# Patient Record
Sex: Male | Born: 1945 | Race: White | Hispanic: No | Marital: Married | Smoking: Never smoker
Health system: Southern US, Community
[De-identification: ages and names within clinical notes are randomized; demographics above are authoritative.]

## PROBLEM LIST (undated history)

## (undated) DIAGNOSIS — E785 Hyperlipidemia, unspecified: Secondary | ICD-10-CM

## (undated) DIAGNOSIS — I2510 Atherosclerotic heart disease of native coronary artery without angina pectoris: Secondary | ICD-10-CM

## (undated) DIAGNOSIS — I209 Angina pectoris, unspecified: Secondary | ICD-10-CM

## (undated) DIAGNOSIS — R7303 Prediabetes: Secondary | ICD-10-CM

## (undated) DIAGNOSIS — J40 Bronchitis, not specified as acute or chronic: Secondary | ICD-10-CM

## (undated) DIAGNOSIS — R011 Cardiac murmur, unspecified: Secondary | ICD-10-CM

## (undated) DIAGNOSIS — Z85828 Personal history of other malignant neoplasm of skin: Secondary | ICD-10-CM

## (undated) DIAGNOSIS — C61 Malignant neoplasm of prostate: Secondary | ICD-10-CM

## (undated) DIAGNOSIS — J189 Pneumonia, unspecified organism: Secondary | ICD-10-CM

## (undated) DIAGNOSIS — Z8739 Personal history of other diseases of the musculoskeletal system and connective tissue: Secondary | ICD-10-CM

## (undated) DIAGNOSIS — S62609A Fracture of unspecified phalanx of unspecified finger, initial encounter for closed fracture: Secondary | ICD-10-CM

## (undated) DIAGNOSIS — I10 Essential (primary) hypertension: Secondary | ICD-10-CM

## (undated) DIAGNOSIS — Z87442 Personal history of urinary calculi: Secondary | ICD-10-CM

## (undated) DIAGNOSIS — J329 Chronic sinusitis, unspecified: Secondary | ICD-10-CM

## (undated) DIAGNOSIS — J0100 Acute maxillary sinusitis, unspecified: Secondary | ICD-10-CM

## (undated) DIAGNOSIS — T7840XA Allergy, unspecified, initial encounter: Secondary | ICD-10-CM

## (undated) HISTORY — PX: PROSTATE BIOPSY: SHX241

## (undated) HISTORY — PX: TONSILLECTOMY AND ADENOIDECTOMY: SUR1326

## (undated) HISTORY — DX: Chronic sinusitis, unspecified: J32.9

## (undated) HISTORY — DX: Allergy, unspecified, initial encounter: T78.40XA

## (undated) HISTORY — DX: Acute maxillary sinusitis, unspecified: J01.00

## (undated) HISTORY — PX: WISDOM TOOTH EXTRACTION: SHX21

## (undated) HISTORY — DX: Bronchitis, not specified as acute or chronic: J40

## (undated) HISTORY — PX: CARDIAC SURGERY: SHX584

## (undated) HISTORY — PX: EYE SURGERY: SHX253

## (undated) HISTORY — PX: LITHOTRIPSY: SUR834

---

## 1999-10-12 ENCOUNTER — Ambulatory Visit (HOSPITAL_COMMUNITY): Admission: RE | Admit: 1999-10-12 | Discharge: 1999-10-12 | Payer: Self-pay | Admitting: *Deleted

## 2005-02-22 ENCOUNTER — Ambulatory Visit: Payer: Self-pay | Admitting: Family Medicine

## 2005-03-01 ENCOUNTER — Ambulatory Visit: Payer: Self-pay

## 2007-08-20 ENCOUNTER — Emergency Department (HOSPITAL_COMMUNITY): Admission: EM | Admit: 2007-08-20 | Discharge: 2007-08-20 | Payer: Self-pay | Admitting: Emergency Medicine

## 2007-08-24 ENCOUNTER — Encounter: Payer: Self-pay | Admitting: Urology

## 2007-08-28 ENCOUNTER — Emergency Department (HOSPITAL_COMMUNITY): Admission: EM | Admit: 2007-08-28 | Discharge: 2007-08-28 | Payer: Self-pay | Admitting: Emergency Medicine

## 2007-08-28 ENCOUNTER — Ambulatory Visit (HOSPITAL_COMMUNITY): Admission: RE | Admit: 2007-08-28 | Discharge: 2007-08-28 | Payer: Self-pay | Admitting: Urology

## 2011-04-23 NOTE — Procedures (Signed)
[facility]  Patient:    Lance Stevens                       MRN: [id number] Proc. Date: 10/12/99 Adm. Date:  [id number] Attending:  Sabino Gasser                           Procedure Report  PROCEDURE:  Colonoscopy.  ENDOSCOPIST:  Sabino Gasser, M.D.  INDICATIONS:  Colon polyps seen on flexible sigmoidoscopy.  ANESTHESIA:  Fentanyl 100 mcg and Versed 10 mg intravenously in divided doses.  DESCRIPTION OF PROCEDURE:  With the patient mildly sedated in the left lateral decubitus position, a rectal examination was performed which was unremarkable.  Subsequently, the Olympus videoscopic colonoscope was inserted in the rectum and passed under direct vision to the cecum.  The cecum was identified by the ileocecal valve and appendiceal orifice, both of which were photographed.  From this point, the colonoscope was slowly withdrawn, taking circumferential views of the entire colonic mucosa, stopping then only in the rectum which appeared normal on direct view and on retroflex view as well.  The endoscope was then straightened and pulled back, and it was 10 cm from the anal verge.  A small flat polyp was seen, photographed, and using hot biopsy forceps technique, setting of 25/25 blunted current it was removed.  The endoscope was withdrawn.  The patients vital signs and pulse oximeter remained stable.  The patient tolerated the procedure well and there were no apparent complications.  FINDINGS:  Small polyp at 10 cm from the anal verge, await biopsy report.  The patient will call for me for results, and follow up with me on an as needed basis. DD:  10/12/99 TD:  10/13/99 Job: [id number]

## 2011-09-17 LAB — URINALYSIS, ROUTINE W REFLEX MICROSCOPIC
Leukocytes, UA: NEGATIVE
Nitrite: NEGATIVE
Specific Gravity, Urine: 1.019
Urobilinogen, UA: 0.2
pH: 5.5

## 2011-09-17 LAB — COMPREHENSIVE METABOLIC PANEL
ALT: 37
AST: 17
Albumin: 3.9
Alkaline Phosphatase: 55
BUN: 20
GFR calc Af Amer: 49 — ABNORMAL LOW
Potassium: 5
Sodium: 136
Total Protein: 6.6

## 2011-09-17 LAB — CBC
Platelets: 244
RDW: 12.6

## 2011-09-17 LAB — URINE MICROSCOPIC-ADD ON

## 2011-09-17 LAB — DIFFERENTIAL
Basophils Relative: 1
Monocytes Absolute: 0.5
Monocytes Relative: 5
Neutro Abs: 10.5 — ABNORMAL HIGH

## 2013-09-05 ENCOUNTER — Ambulatory Visit (INDEPENDENT_AMBULATORY_CARE_PROVIDER_SITE_OTHER): Payer: Medicare Other | Admitting: Emergency Medicine

## 2013-09-05 VITALS — BP 126/64 | HR 81 | Temp 97.6°F | Resp 18 | Ht 69.0 in | Wt 234.0 lb

## 2013-09-05 DIAGNOSIS — J209 Acute bronchitis, unspecified: Secondary | ICD-10-CM

## 2013-09-05 DIAGNOSIS — J018 Other acute sinusitis: Secondary | ICD-10-CM

## 2013-09-05 MED ORDER — HYDROCOD POLST-CHLORPHEN POLST 10-8 MG/5ML PO LQCR: 5.0000 mL | Freq: Two times a day (BID) | ORAL | Status: DC | PRN

## 2013-09-05 MED ORDER — PSEUDOEPHEDRINE-GUAIFENESIN ER 60-600 MG PO TB12: 1.0000 | ORAL_TABLET | Freq: Two times a day (BID) | ORAL | Status: AC

## 2013-09-05 MED ORDER — LEVOFLOXACIN 500 MG PO TABS: 500.0000 mg | ORAL_TABLET | Freq: Every day | ORAL | Status: AC

## 2013-09-05 NOTE — Progress Notes (Signed)
[facility] [address] [place] [address] [phone]  Date:  09/05/2013   Name:  Lance Stevens   DOB:  June 19, 1946   MRN:  [id number]  PCP:  No primary provider on file.    Chief Complaint: cough, congestion, yellow drainage   History of Present Illness:  Lance Stevens is a 67 y.o. very pleasant male patient who presents with the following:  Ill all week with nasal congestion and post nasal drainage.  Has purulent nasal discharge.  No fever or chills.  Cough productive of purulent sputum.   No wheezing or shortness of breath.  No nausea or vomiting.  No improvement with over the counter medications or other home remedies. Denies other complaint or health concern today.   There are no active problems to display for this patient.   Past Medical History  Diagnosis Date  . Allergy   . Chronic kidney disease   . Sinus infection   . Bronchitis     Past Surgical History  Procedure Laterality Date  . Eye surgery    . Wisdom tooth extraction      History  Substance Use Topics  . Smoking status: Never Smoker   . Smokeless tobacco: Not on file  . Alcohol Use: Not on file    Family History  Problem Relation Age of Onset  . Heart attack Father   . Parkinson's disease Brother   . Stroke Maternal Grandmother     Allergies  Allergen Reactions  . Penicillins     Medication list has been reviewed and updated.  No current outpatient prescriptions on file prior to visit.   No current facility-administered medications on file prior to visit.    Review of Systems:  As per HPI, otherwise negative.    Physical Examination: Filed Vitals:   09/05/13 1122  BP: 126/64  Pulse: 81  Temp: 97.6 F (36.4 C)  Resp: 18   Filed Vitals:   09/05/13 1122  Height: 5\' 9"  (1.753 m)  Weight: 234 lb (106.142 kg)   Body mass index is 34.54 kg/(m^2). Ideal Body Weight: Weight in (lb) to have BMI = 25: 168.9  GEN: WDWN, NAD, Non-toxic, A & O x  3 HEENT: Atraumatic, Normocephalic. Neck supple. No masses, No LAD. Ears and Nose: No external deformity. CV: RRR, No M/G/R. No JVD. No thrill. No extra heart sounds. PULM: CTA B, no wheezes, crackles, rhonchi. No retractions. No resp. distress. No accessory muscle use. ABD: S, NT, ND, +BS. No rebound. No HSM. EXTR: No c/c/e NEURO Normal gait.  PSYCH: Normally interactive. Conversant. Not depressed or anxious appearing.  Calm demeanor.    Assessment and Plan: Sinusitis Bronchitis tussionex mucinex d augmentin   Signed,  Phillips Odor, MD

## 2013-09-05 NOTE — Patient Instructions (Addendum)

## 2013-12-04 ENCOUNTER — Ambulatory Visit (INDEPENDENT_AMBULATORY_CARE_PROVIDER_SITE_OTHER): Payer: Medicare Other | Admitting: Emergency Medicine

## 2013-12-04 VITALS — BP 158/86 | HR 74 | Temp 98.2°F | Resp 16 | Ht 69.5 in | Wt 235.4 lb

## 2013-12-04 DIAGNOSIS — J018 Other acute sinusitis: Secondary | ICD-10-CM

## 2013-12-04 DIAGNOSIS — J209 Acute bronchitis, unspecified: Secondary | ICD-10-CM

## 2013-12-04 MED ORDER — AMOXICILLIN-POT CLAVULANATE 875-125 MG PO TABS: 1.0000 | ORAL_TABLET | Freq: Two times a day (BID) | ORAL | Status: DC

## 2013-12-04 MED ORDER — PROMETHAZINE-CODEINE 6.25-10 MG/5ML PO SYRP: 5.0000 mL | ORAL_SOLUTION | Freq: Four times a day (QID) | ORAL | Status: DC | PRN

## 2013-12-04 NOTE — Patient Instructions (Signed)

## 2013-12-04 NOTE — Progress Notes (Signed)
[facility] [address], [place] [address] [phone]  Date:  12/04/2013   Name:  Lance Stevens   DOB:  1946/11/30   MRN:  [id number]  PCP:  No primary provider on file.    Chief Complaint: Sinus Congestion and Cough   History of Present Illness:  Lance Stevens is a 67 y.o. very pleasant male patient who presents with the following:  Ill since nasal congestion and purulent drainage.  Has cough productive purulent sputum.  Some exertional shortness of breath.  Has pressure in forehead.  No wheezing. Sore throat.  Has fever no chills.  No nausea or vomiting.  No stool change.  No improvement with over the counter medications or other home remedies. Denies other complaint or health concern today.  There are no active problems to display for this patient.   Past Medical History  Diagnosis Date  . Allergy   . Chronic kidney disease   . Sinus infection   . Bronchitis     Past Surgical History  Procedure Laterality Date  . Eye surgery    . Wisdom tooth extraction      History  Substance Use Topics  . Smoking status: Never Smoker   . Smokeless tobacco: Not on file  . Alcohol Use: Not on file    Family History  Problem Relation Age of Onset  . Heart attack Father   . Parkinson's disease Brother   . Stroke Maternal Grandmother     Allergies  Allergen Reactions  . Penicillins     Medication list has been reviewed and updated.  Current Outpatient Prescriptions on File Prior to Visit  Medication Sig Dispense Refill  . aspirin 81 MG tablet Take 81 mg by mouth daily.      . magnesium 30 MG tablet Take 30 mg by mouth 2 (two) times daily.      . Multiple Vitamin (MULTIVITAMIN) capsule Take 1 capsule by mouth daily.      . chlorpheniramine-HYDROcodone (TUSSIONEX PENNKINETIC ER) 10-8 MG/5ML LQCR Take 5 mLs by mouth every 12 (twelve) hours as needed.  60 mL  0  . pseudoephedrine-guaifenesin (MUCINEX D) 60-600 MG per tablet Take 1 tablet by  mouth every 12 (twelve) hours.  18 tablet  0   No current facility-administered medications on file prior to visit.    Review of Systems:  As per HPI, otherwise negative.    Physical Examination: Filed Vitals:   12/04/13 1002  BP: 158/86  Pulse: 74  Temp: 98.2 F (36.8 C)  Resp: 16   Filed Vitals:   12/04/13 1002  Height: 5' 9.5" (1.765 m)  Weight: 235 lb 6.4 oz (106.777 kg)   Body mass index is 34.28 kg/(m^2). Ideal Body Weight: Weight in (lb) to have BMI = 25: 171.4  GEN: WDWN, NAD, Non-toxic, A & O x 3 HEENT: Atraumatic, Normocephalic. Neck supple. No masses, No LAD. Ears and Nose: No external deformity. CV: RRR, No M/G/R. No JVD. No thrill. No extra heart sounds. PULM: CTA B, no wheezes, crackles, rhonchi. No retractions. No resp. distress. No accessory muscle use. ABD: S, NT, ND, +BS. No rebound. No HSM. EXTR: No c/c/e NEURO Normal gait.  PSYCH: Normally interactive. Conversant. Not depressed or anxious appearing.  Calm demeanor.    Assessment and Plan: Elevated BP from usual  Sinusitis bronchitits augmentin Phen c cod  Signed,  Phillips Odor, MD

## 2015-05-07 ENCOUNTER — Encounter: Payer: Self-pay | Admitting: Family Medicine

## 2015-05-07 ENCOUNTER — Ambulatory Visit (INDEPENDENT_AMBULATORY_CARE_PROVIDER_SITE_OTHER): Payer: Medicare Other | Admitting: Family Medicine

## 2015-05-07 VITALS — BP 140/72 | HR 66 | Temp 97.4°F | Resp 16 | Ht 69.0 in | Wt 218.2 lb

## 2015-05-07 DIAGNOSIS — Z87442 Personal history of urinary calculi: Secondary | ICD-10-CM | POA: Diagnosis not present

## 2015-05-07 DIAGNOSIS — Z1211 Encounter for screening for malignant neoplasm of colon: Secondary | ICD-10-CM | POA: Diagnosis not present

## 2015-05-07 DIAGNOSIS — IMO0001 Reserved for inherently not codable concepts without codable children: Secondary | ICD-10-CM

## 2015-05-07 DIAGNOSIS — R030 Elevated blood-pressure reading, without diagnosis of hypertension: Secondary | ICD-10-CM | POA: Diagnosis not present

## 2015-05-07 DIAGNOSIS — Z820 Family history of epilepsy and other diseases of the nervous system: Secondary | ICD-10-CM | POA: Diagnosis not present

## 2015-05-07 DIAGNOSIS — R7302 Impaired glucose tolerance (oral): Secondary | ICD-10-CM

## 2015-05-07 DIAGNOSIS — Z23 Encounter for immunization: Secondary | ICD-10-CM

## 2015-05-07 LAB — POCT URINALYSIS DIPSTICK
BILIRUBIN UA: NEGATIVE
Blood, UA: NEGATIVE
GLUCOSE UA: NEGATIVE
KETONES UA: NEGATIVE
LEUKOCYTES UA: NEGATIVE
NITRITE UA: NEGATIVE
PROTEIN UA: NEGATIVE
Spec Grav, UA: 1.025
Urobilinogen, UA: 0.2
pH, UA: 5

## 2015-05-07 LAB — COMPREHENSIVE METABOLIC PANEL
ALBUMIN: 4.7 g/dL (ref 3.5–5.2)
ALT: 30 U/L (ref 0–53)
AST: 12 U/L (ref 0–37)
Alkaline Phosphatase: 87 U/L (ref 39–117)
BUN: 17 mg/dL (ref 6–23)
CHLORIDE: 103 meq/L (ref 96–112)
CO2: 25 meq/L (ref 19–32)
CREATININE: 1.24 mg/dL (ref 0.50–1.35)
Calcium: 9.3 mg/dL (ref 8.4–10.5)
GLUCOSE: 136 mg/dL — AB (ref 70–99)
POTASSIUM: 4.9 meq/L (ref 3.5–5.3)
SODIUM: 140 meq/L (ref 135–145)
Total Bilirubin: 1 mg/dL (ref 0.2–1.2)
Total Protein: 6.8 g/dL (ref 6.0–8.3)

## 2015-05-07 LAB — HEMOGLOBIN A1C
HEMOGLOBIN A1C: 7 % — AB (ref <5.7)
MEAN PLASMA GLUCOSE: 154 mg/dL — AB (ref <117)

## 2015-05-07 MED ORDER — ZOSTER VACCINE LIVE 19400 UNT/0.65ML ~~LOC~~ SOLR
0.6500 mL | Freq: Once | SUBCUTANEOUS | Status: DC
Start: 2015-05-07 — End: 2015-12-09

## 2015-05-07 NOTE — Progress Notes (Signed)
Subjective:    Patient ID: Lance Stevens, male    DOB: 1946-01-28, 69 y.o.   MRN: [id number]  05/07/2015  Establish Care   HPI This 69 y.o. male presents to establish care.  Last physical:  Years ago. Colonoscopy:  Lajoyce [facility]ers; 2000; over ten years; needs to schedule.   TDAP:  Years ago. Pneumovax:  Never.   Zostavax:  never Influenza:  Sporadic. Eye exam:  Yearly.  +glasses.  2014. Dental exam:  2015   Shingles vaccine: requesting rx.  Mother with shingles during Hodgkin's disease.  History of chicken pox.  Recurrent sinusitis: [facility] acute sinus issues.  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    Intervened with four different imploding churches.  Blood pressure elevated due to ongoing conflict.  Successful intervention.  Previously placed on Lisinopril; worked really well; Bp 120/80 on Lisinopril.  Developed weakness in legs B; also suffered with sexual function side effects.  Tried eating healthy foods to lower BP.  Did previously weigh 240; avoided sweetened beverages and carbohydrates; blood pressure improved.  Has gained some weight; weighed 214 at home every morning.  Trying to get to 180.  Needs to start walking again.  Daughter is working as Copywriter, advertising.  Not checking regularly at home.  Works in the yard a lot; has Art therapist.  Lots of trees and ivy.  Eats out a lot but enjoys eating at home.  Nephrolithiasis: suffered with kidney stone five years.  Watching diet; s/p lithotripsy.   Brother with Parkinson's disease: no tremor.  Brother with glucose intolerance.  Glucose intolerance: improved with weight loss and dietary modification.  Urinary incontinence:  Driver's Counselling psychologist.  Drives four hours at a time; unable to urinate regularly.  Restricts fluids before driving.  Will need prostate exam.  No troubles urinating.  Gets up once per night to urinate.        Review of Systems  Constitutional: Negative for fever, chills, diaphoresis, activity change, appetite change, fatigue and unexpected weight change.  HENT: Negative for congestion, dental problem, drooling, ear discharge, ear pain, facial swelling, hearing loss, mouth sores, nosebleeds, postnasal drip, rhinorrhea, sinus pressure, sneezing, sore throat, tinnitus, trouble swallowing and voice change.   Eyes: Negative for photophobia, pain, discharge, redness, itching and visual disturbance.  Respiratory: Negative for apnea, cough, choking, chest tightness, shortness of breath, wheezing and stridor.   Cardiovascular: Negative for chest pain, palpitations and leg swelling.  Gastrointestinal: Negative for nausea, vomiting, abdominal pain, diarrhea, constipation and blood in stool.  Endocrine: Negative for cold intolerance, heat intolerance, polydipsia, polyphagia and polyuria.  Genitourinary: Positive for urgency. Negative for dysuria, frequency, hematuria, flank pain, decreased urine volume, discharge, penile swelling, scrotal swelling, enuresis, difficulty urinating, genital sores, penile pain and testicular pain.  Musculoskeletal: Negative for myalgias, back pain, joint swelling, arthralgias, gait problem, neck pain and neck stiffness.  Skin: Negative for color change, pallor, rash and wound.  Allergic/Immunologic: Negative for environmental allergies, food allergies and immunocompromised state.  Neurological: Negative for dizziness, tremors, seizures, syncope, facial asymmetry, speech difficulty, weakness, light-headedness, numbness and headaches.  Hematological: Negative for adenopathy. Does not bruise/bleed easily.  Psychiatric/Behavioral: Negative for suicidal ideas, hallucinations, behavioral problems, confusion, sleep disturbance, self-injury, dysphoric mood, decreased concentration and agitation. The patient is not nervous/anxious and is not hyperactive.     Past Medical History   Diagnosis Date  . Allergy   . Sinus infection   .  Bronchitis   . Nephrolithiasis    Past Surgical History  Procedure Laterality Date  . Wisdom tooth extraction    . Lithotripsy    . Eye surgery      B cataracts removed.   Allergies  Allergen Reactions  . Penicillins Swelling    childhood    History   Social History  . Marital Status: Married    Spouse Name: N/A  . Number of Children: N/A  . Years of Education: N/A   Occupational History  . retired    Social History Main Topics  . Smoking status: Never Smoker   . Smokeless tobacco: Not on file  . Alcohol Use: 1.2 oz/week    2 Standard drinks or equivalent per week     Comment: wine - red  . Drug Use: Not on file  . Sexual Activity: Not on file   Other Topics Concern  . Not on file   Social History Narrative   Marital status: married x 25 years; second marriage; first marriage x 12 years.      Children: 1 daughter Apolonio Schneiders); 4 grandchildren      Lives: with wife.      Employment: semi-retired; Company secretary; professor; Social worker, Probation officer.      Tobacco:        Alcohol:      Exercise: active in the yard.   Family History  Problem Relation Age of Onset  . Heart attack Father   . Heart disease Father 43    AMI as cause of death  . Diabetes Father   . Parkinson's disease Brother   . Diabetes Brother   . Cancer Brother     melanoma retina  . Stroke Maternal Grandmother   . Cancer Mother 67    Hodgkins   . Glaucoma Mother         Objective:    BP 140/72 mmHg  Pulse 66  Temp(Src) 97.4 F (36.3 C) (Oral)  Resp 16  Ht 5\' 9"  (1.753 m)  Wt 218 lb 3.2 oz (98.975 kg)  BMI 32.21 kg/m2  SpO2 97% Physical Exam  Constitutional: He is oriented to person, place, and time. He appears well-developed and well-nourished. No distress.  HENT:  Head: Normocephalic and atraumatic.  Right Ear: External ear normal.  Left Ear: External ear normal.  Nose: Nose normal.  Mouth/Throat: Oropharynx is clear and moist.   Eyes: Conjunctivae and EOM are normal. Pupils are equal, round, and reactive to light.  Neck: Normal range of motion. Neck supple. Carotid bruit is not present. No thyromegaly present.  Cardiovascular: Normal rate, regular rhythm, normal heart sounds and intact distal pulses.  Exam reveals no gallop and no friction rub.   No murmur heard. Pulmonary/Chest: Effort normal and breath sounds normal. He has no wheezes. He has no rales.  Abdominal: Soft. Bowel sounds are normal. He exhibits no distension and no mass. There is no tenderness. There is no rebound and no guarding.  Musculoskeletal:       Right shoulder: Normal.       Left shoulder: Normal.       Cervical back: Normal.  Lymphadenopathy:    He has no cervical adenopathy.  Neurological: He is alert and oriented to person, place, and time. He has normal reflexes. No cranial nerve deficit. He exhibits normal muscle tone. Coordination normal.  Skin: Skin is warm and dry. No rash noted. He is not diaphoretic.  Psychiatric: He has a normal mood and affect. His behavior is normal.  Judgment and thought content normal.    PREVNAR 13 ADMINISTERED.    Assessment & Plan:   1. Glucose intolerance (impaired glucose tolerance)   2. Blood pressure elevated   3. Colon cancer screening   4. Need for shingles vaccine   5. Family history of Parkinson's disease   6. History of nephrolithiasis   7. Need for prophylactic vaccination against Streptococcus pneumoniae (pneumococcus)    1. Glucose intolerance: Stable; obtain labs; continue to work on exercise, weight loss, dietary modification. 2. Blood pressure elevated: Chronic; obtain labs; recommend weight loss, exercise, low-sodium food choices.  RTC 3-6 months for recheck. 3.  Colon cancer screening: refer to GI for consultation. 4.  Rx for Zostavax provided. 5.  Family history of Parkinson's disease: In brother; pt asymptomatic. 6.  S/p Prevnar 13 in office.   Meds ordered this encounter   Medications  . zoster vaccine live, PF, (ZOSTAVAX) 68127 UNT/0.65ML injection    Sig: Inject 19,400 Units into the skin once.    Dispense:  0.65 mL    Refill:  0    Return in about 3 months (around 08/07/2015) for complete physical examiniation.     Aldous Housel Elayne Guerin, M.D. [facility] [address] [place], [place]  [address] [phone] phone [phone] fax

## 2015-05-07 NOTE — Patient Instructions (Signed)
DASH Eating Plan °DASH stands for "Dietary Approaches to Stop Hypertension." The DASH eating plan is a healthy eating plan that has been shown to reduce high blood pressure (hypertension). Additional health benefits may include reducing the risk of type 2 diabetes mellitus, heart disease, and stroke. The DASH eating plan may also help with weight loss. °WHAT DO I NEED TO KNOW ABOUT THE DASH EATING PLAN? °For the DASH eating plan, you will follow these general guidelines: °· Choose foods with a percent daily value for sodium of less than 5% (as listed on the food label). °· Use salt-free seasonings or herbs instead of table salt or sea salt. °· Check with your health care provider or pharmacist before using salt substitutes. °· Eat lower-sodium products, often labeled as "lower sodium" or "no salt added." °· Eat fresh foods. °· Eat more vegetables, fruits, and low-fat dairy products. °· Choose whole grains. Look for the word "whole" as the first word in the ingredient list. °· Choose fish and skinless chicken or turkey more often than red meat. Limit fish, poultry, and meat to 6 oz (170 g) each day. °· Limit sweets, desserts, sugars, and sugary drinks. °· Choose heart-healthy fats. °· Limit cheese to 1 oz (28 g) per day. °· Eat more home-cooked food and less restaurant, buffet, and fast food. °· Limit fried foods. °· Cook foods using methods other than frying. °· Limit canned vegetables. If you do use them, rinse them well to decrease the sodium. °· When eating at a restaurant, ask that your food be prepared with less salt, or no salt if possible. °WHAT FOODS CAN I EAT? °Seek help from a dietitian for individual calorie needs. °Grains °Whole grain or whole wheat bread. Brown rice. Whole grain or whole wheat pasta. Quinoa, bulgur, and whole grain cereals. Low-sodium cereals. Corn or whole wheat flour tortillas. Whole grain cornbread. Whole grain crackers. Low-sodium crackers. °Vegetables °Fresh or frozen vegetables  (raw, steamed, roasted, or grilled). Low-sodium or reduced-sodium tomato and vegetable juices. Low-sodium or reduced-sodium tomato sauce and paste. Low-sodium or reduced-sodium canned vegetables.  °Fruits °All fresh, canned (in natural juice), or frozen fruits. °Meat and Other Protein Products °Ground beef (85% or leaner), grass-fed beef, or beef trimmed of fat. Skinless chicken or turkey. Ground chicken or turkey. Pork trimmed of fat. All fish and seafood. Eggs. Dried beans, peas, or lentils. Unsalted nuts and seeds. Unsalted canned beans. °Dairy °Low-fat dairy products, such as skim or 1% milk, 2% or reduced-fat cheeses, low-fat ricotta or cottage cheese, or plain low-fat yogurt. Low-sodium or reduced-sodium cheeses. °Fats and Oils °Tub margarines without trans fats. Light or reduced-fat mayonnaise and salad dressings (reduced sodium). Avocado. Safflower, olive, or canola oils. Natural peanut or almond butter. °Other °Unsalted popcorn and pretzels. °The items listed above may not be a complete list of recommended foods or beverages. Contact your dietitian for more options. °WHAT FOODS ARE NOT RECOMMENDED? °Grains °White bread. White pasta. White rice. Refined cornbread. Bagels and croissants. Crackers that contain trans fat. °Vegetables °Creamed or fried vegetables. Vegetables in a cheese sauce. Regular canned vegetables. Regular canned tomato sauce and paste. Regular tomato and vegetable juices. °Fruits °Dried fruits. Canned fruit in light or heavy syrup. Fruit juice. °Meat and Other Protein Products °Fatty cuts of meat. Ribs, chicken wings, bacon, sausage, bologna, salami, chitterlings, fatback, hot dogs, bratwurst, and packaged luncheon meats. Salted nuts and seeds. Canned beans with salt. °Dairy °Whole or 2% milk, cream, half-and-half, and cream cheese. Whole-fat or sweetened yogurt. Full-fat   cheeses or blue cheese. Nondairy creamers and whipped toppings. Processed cheese, cheese spreads, or cheese  curds. °Condiments °Onion and garlic salt, seasoned salt, table salt, and sea salt. Canned and packaged gravies. Worcestershire sauce. Tartar sauce. Barbecue sauce. Teriyaki sauce. Soy sauce, including reduced sodium. Steak sauce. Fish sauce. Oyster sauce. Cocktail sauce. Horseradish. Ketchup and mustard. Meat flavorings and tenderizers. Bouillon cubes. Hot sauce. Tabasco sauce. Marinades. Taco seasonings. Relishes. °Fats and Oils °Butter, stick margarine, lard, shortening, ghee, and bacon fat. Coconut, palm kernel, or palm oils. Regular salad dressings. °Other °Pickles and olives. Salted popcorn and pretzels. °The items listed above may not be a complete list of foods and beverages to avoid. Contact your dietitian for more information. °WHERE CAN I FIND MORE INFORMATION? °National Heart, Lung, and Blood Institute: www.nhlbi.nih.gov/health/health-topics/topics/dash/ °Document Released: 11/11/2011 Document Revised: 04/08/2014 Document Reviewed: 09/26/2013 °ExitCare® Patient Information ©2015 [employer]. This information is not intended to replace advice given to you by your health care provider. Make sure you discuss any questions you have with your health care provider. ° °

## 2015-05-13 ENCOUNTER — Encounter: Payer: Self-pay | Admitting: Family Medicine

## 2015-05-27 DIAGNOSIS — Z1211 Encounter for screening for malignant neoplasm of colon: Secondary | ICD-10-CM | POA: Insufficient documentation

## 2015-05-27 DIAGNOSIS — Z820 Family history of epilepsy and other diseases of the nervous system: Secondary | ICD-10-CM | POA: Insufficient documentation

## 2015-05-27 DIAGNOSIS — Z23 Encounter for immunization: Secondary | ICD-10-CM | POA: Insufficient documentation

## 2015-05-27 DIAGNOSIS — Z87442 Personal history of urinary calculi: Secondary | ICD-10-CM | POA: Insufficient documentation

## 2015-05-27 DIAGNOSIS — E119 Type 2 diabetes mellitus without complications: Secondary | ICD-10-CM | POA: Insufficient documentation

## 2015-06-25 ENCOUNTER — Encounter: Payer: Self-pay | Admitting: Gastroenterology

## 2015-08-13 ENCOUNTER — Ambulatory Visit (AMBULATORY_SURGERY_CENTER): Payer: Self-pay

## 2015-08-13 VITALS — Ht 70.0 in | Wt 218.0 lb

## 2015-08-13 DIAGNOSIS — Z860100 Personal history of colon polyps, unspecified: Secondary | ICD-10-CM

## 2015-08-13 NOTE — Progress Notes (Signed)
No home oxygen No allergies to eggs or soy No past problems with anesthesia No diet/weight loss meds  Has email  Emmi instructions given for colonoscopy 

## 2015-08-27 ENCOUNTER — Ambulatory Visit (AMBULATORY_SURGERY_CENTER): Payer: Medicare Other | Admitting: Gastroenterology

## 2015-08-27 ENCOUNTER — Encounter: Payer: Self-pay | Admitting: Gastroenterology

## 2015-08-27 VITALS — BP 136/80 | HR 59 | Temp 97.8°F | Resp 20 | Ht 69.0 in | Wt 218.0 lb

## 2015-08-27 DIAGNOSIS — Z86010 Personal history of colonic polyps: Secondary | ICD-10-CM | POA: Diagnosis not present

## 2015-08-27 MED ORDER — SODIUM CHLORIDE 0.9 % IV SOLN: 500.0000 mL | INTRAVENOUS | Status: DC

## 2015-08-27 NOTE — Op Note (Signed)
[place]  [address] [place], [id number]   COLONOSCOPY PROCEDURE REPORT  PATIENT: Lance Stevens, Lance Stevens  MR#: [id number] BIRTHDATE: 1946/08/02 , 69  yrs. old GENDER: male ENDOSCOPIST: Ladene Artist, MD, [facility] REFERRED HW:[id number] Tamala Julian, M.D. PROCEDURE DATE:  08/27/2015 PROCEDURE:   Colonoscopy, surveillance First Screening Colonoscopy - Avg.  risk and is 50 yrs.  old or older - No.  Prior Negative Screening - Now for repeat screening. N/A  History of Adenoma - Now for follow-up colonoscopy & has been > or = to 3 yrs.  Yes hx of adenoma.  Has been 3 or more years since last colonoscopy.  Polyps removed today? No Recommend repeat exam, <10 yrs? No ASA CLASS:   Class II INDICATIONS:Surveillance due to prior colonic neoplasia and PH Colon Adenoma. MEDICATIONS: Monitored anesthesia care and Propofol 170 mg IV DESCRIPTION OF PROCEDURE:   After the risks benefits and alternatives of the procedure were thoroughly explained, informed consent was obtained.  The digital rectal exam revealed no abnormalities of the rectum.   The LB [id number]  endoscope was introduced through the anus and advanced to the cecum, which was identified by both the appendix and ileocecal valve. No adverse events experienced.   The quality of the prep was excellent. (Suprep was used)  The instrument was then slowly withdrawn as the colon was fully examined. Estimated blood loss is zero unless otherwise noted in this procedure report.    COLON FINDINGS: A normal appearing cecum, ileocecal valve, and appendiceal orifice were identified.  The ascending, transverse, descending, sigmoid colon, and rectum appeared unremarkable. Retroflexed views revealed internal Grade I hemorrhoids. The time to cecum = 2.0 Withdrawal time = 11.2   The scope was withdrawn and the procedure completed. COMPLICATIONS: There were no immediate complications.  ENDOSCOPIC IMPRESSION: 1.  Normal colonoscopy 2.   Grade I internal hemorrhoids  RECOMMENDATIONS: Continue current colorectal screening recommendations for "routine risk" patients with a repeat colonoscopy in 10 years.  eSigned:  Ladene Artist, MD, [facility] 08/27/2015 8:48 AM

## 2015-08-27 NOTE — Patient Instructions (Signed)
Impressions/recommendations:  Normal colonoscopy Internal hemorrhoids (handout given)  Repeat colonoscopy in 10 years.  YOU HAD AN ENDOSCOPIC PROCEDURE TODAY AT [facility]:   Refer to the procedure report that was given to you for any specific questions about what was found during the examination.  If the procedure report does not answer your questions, please call your gastroenterologist to clarify.  If you requested that your care partner not be given the details of your procedure findings, then the procedure report has been included in a sealed envelope for you to review at your convenience later.  YOU SHOULD EXPECT: Some feelings of bloating in the abdomen. Passage of more gas than usual.  Walking can help get rid of the air that was put into your GI tract during the procedure and reduce the bloating. If you had a lower endoscopy (such as a colonoscopy or flexible sigmoidoscopy) you may notice spotting of blood in your stool or on the toilet paper. If you underwent a bowel prep for your procedure, you may not have a normal bowel movement for a few days.  Please Note:  You might notice some irritation and congestion in your nose or some drainage.  This is from the oxygen used during your procedure.  There is no need for concern and it should clear up in a day or so.  SYMPTOMS TO REPORT IMMEDIATELY:   Following lower endoscopy (colonoscopy or flexible sigmoidoscopy):  Excessive amounts of blood in the stool  Significant tenderness or worsening of abdominal pains  Swelling of the abdomen that is new, acute  Fever of 100F or higher  For urgent or emergent issues, a gastroenterologist can be reached at any hour by calling [phone].   DIET: Your first meal following the procedure should be a small meal and then it is ok to progress to your normal diet. Heavy or fried foods are harder to digest and may make you feel nauseous or bloated.  Likewise, meals heavy in dairy  and vegetables can increase bloating.  Drink plenty of fluids but you should avoid alcoholic beverages for 24 hours.  ACTIVITY:  You should plan to take it easy for the rest of today and you should NOT DRIVE or use heavy machinery until tomorrow (because of the sedation medicines used during the test).    FOLLOW UP: Our staff will call the number listed on your records the next business day following your procedure to check on you and address any questions or concerns that you may have regarding the information given to you following your procedure. If we do not reach you, we will leave a message.  However, if you are feeling well and you are not experiencing any problems, there is no need to return our call.  We will assume that you have returned to your regular daily activities without incident.  If any biopsies were taken you will be contacted by phone or by letter within the next 1-3 weeks.  Please call us at [phone] if you have not heard about the biopsies in 3 weeks.    SIGNATURES/CONFIDENTIALITY: You and/or your care partner have signed paperwork which will be entered into your electronic medical record.  These signatures attest to the fact that that the information above on your After Visit Summary has been reviewed and is understood.  Full responsibility of the confidentiality of this discharge information lies with you and/or your care-partner.

## 2015-08-27 NOTE — Progress Notes (Signed)
Report to PACU, RN, vss, BBS= Clear.  

## 2015-08-28 ENCOUNTER — Telehealth: Payer: Self-pay | Admitting: *Deleted

## 2015-08-28 NOTE — Telephone Encounter (Signed)
  Follow up Call-  Call back number 08/27/2015  Post procedure Call Back phone  # [phone]  Permission to leave phone message Yes    [facility]

## 2015-11-27 ENCOUNTER — Ambulatory Visit (INDEPENDENT_AMBULATORY_CARE_PROVIDER_SITE_OTHER): Payer: Medicare Other | Admitting: Physician Assistant

## 2015-11-27 VITALS — BP 120/70 | HR 73 | Temp 97.9°F | Resp 18 | Ht 69.75 in | Wt 213.6 lb

## 2015-11-27 DIAGNOSIS — J069 Acute upper respiratory infection, unspecified: Secondary | ICD-10-CM | POA: Diagnosis not present

## 2015-11-27 MED ORDER — IPRATROPIUM BROMIDE 0.03 % NA SOLN: 2.0000 | Freq: Two times a day (BID) | NASAL | Status: DC

## 2015-11-27 MED ORDER — GUAIFENESIN ER 1200 MG PO TB12: 1.0000 | ORAL_TABLET | Freq: Two times a day (BID) | ORAL | Status: DC | PRN

## 2015-11-27 MED ORDER — HYDROCOD POLST-CPM POLST ER 10-8 MG/5ML PO SUER: 5.0000 mL | Freq: Every evening | ORAL | Status: DC | PRN

## 2015-11-27 MED ORDER — AZITHROMYCIN 250 MG PO TABS: ORAL_TABLET | ORAL | Status: DC

## 2015-11-27 NOTE — Patient Instructions (Signed)
uUpper Respiratory Infection, Adult Most upper respiratory infections (URIs) are a viral infection of the air passages leading to the lungs. A URI affects the nose, throat, and upper air passages. The most common type of URI is nasopharyngitis and is typically referred to as "the common cold." URIs run their course and usually go away on their own. Most of the time, a URI does not require medical attention, but sometimes a bacterial infection in the upper airways can follow a viral infection. This is called a secondary infection. Sinus and middle ear infections are common types of secondary upper respiratory infections. Bacterial pneumonia can also complicate a URI. A URI can worsen asthma and chronic obstructive pulmonary disease (COPD). Sometimes, these complications can require emergency medical care and may be life threatening.  CAUSES Almost all URIs are caused by viruses. A virus is a type of germ and can spread from one person to another.  RISKS FACTORS You may be at risk for a URI if:   You smoke.   You have chronic heart or lung disease.  You have a weakened defense (immune) system.   You are very young or very old.   You have nasal allergies or asthma.  You work in crowded or poorly ventilated areas.  You work in health care facilities or schools. SIGNS AND SYMPTOMS  Symptoms typically develop 2-3 days after you come in contact with a cold virus. Most viral URIs last 7-10 days. However, viral URIs from the influenza virus (flu virus) can last 14-18 days and are typically more severe. Symptoms may include:   Runny or stuffy (congested) nose.   Sneezing.   Cough.   Sore throat.   Headache.   Fatigue.   Fever.   Loss of appetite.   Pain in your forehead, behind your eyes, and over your cheekbones (sinus pain).  Muscle aches.  DIAGNOSIS  Your health care provider may diagnose a URI by:  Physical exam.  Tests to check that your symptoms are not due to  another condition such as:  Strep throat.  Sinusitis.  Pneumonia.  Asthma. TREATMENT  A URI goes away on its own with time. It cannot be cured with medicines, but medicines may be prescribed or recommended to relieve symptoms. Medicines may help:  Reduce your fever.  Reduce your cough.  Relieve nasal congestion. HOME CARE INSTRUCTIONS   Take medicines only as directed by your health care provider.   Gargle warm saltwater or take cough drops to comfort your throat as directed by your health care provider.  Use a warm mist humidifier or inhale steam from a shower to increase air moisture. This may make it easier to breathe.  Drink enough fluid to keep your urine clear or pale yellow.   Eat soups and other clear broths and maintain good nutrition.   Rest as needed.   Return to work when your temperature has returned to normal or as your health care provider advises. You may need to stay home longer to avoid infecting others. You can also use a face mask and careful hand washing to prevent spread of the virus.  Increase the usage of your inhaler if you have asthma.   Do not use any tobacco products, including cigarettes, chewing tobacco, or electronic cigarettes. If you need help quitting, ask your health care provider. PREVENTION  The best way to protect yourself from getting a cold is to practice good hygiene.   Avoid oral or hand contact with people with cold  symptoms.   Wash your hands often if contact occurs.  There is no clear evidence that vitamin C, vitamin E, echinacea, or exercise reduces the chance of developing a cold. However, it is always recommended to get plenty of rest, exercise, and practice good nutrition.  SEEK MEDICAL CARE IF:   You are getting worse rather than better.   Your symptoms are not controlled by medicine.   You have chills.  You have worsening shortness of breath.  You have brown or red mucus.  You have yellow or brown nasal  discharge.  You have pain in your face, especially when you bend forward.  You have a fever.  You have swollen neck glands.  You have pain while swallowing.  You have white areas in the back of your throat. SEEK IMMEDIATE MEDICAL CARE IF:   You have severe or persistent:  Headache.  Ear pain.  Sinus pain.  Chest pain.  You have chronic lung disease and any of the following:  Wheezing.  Prolonged cough.  Coughing up blood.  A change in your usual mucus.  You have a stiff neck.  You have changes in your:  Vision.  Hearing.  Thinking.  Mood. MAKE SURE YOU:   Understand these instructions.  Will watch your condition.  Will get help right away if you are not doing well or get worse.   This information is not intended to replace advice given to you by your health care provider. Make sure you discuss any questions you have with your health care provider.   Document Released: 05/18/2001 Document Revised: 04/08/2015 Document Reviewed: 02/27/2014 Elsevier Interactive Patient Education 2016 [employer] Inc.  

## 2015-11-27 NOTE — Progress Notes (Signed)
[facility] [address] [place] [address] [phone]  Date:  11/27/2015   Name:  Lance Stevens   DOB:  1946/08/05   MRN:  [id number]  PCP:  No primary care provider on file.    History of Present Illness:  Lance Stevens is a 69 y.o. male patient who presents to [facility] for cc of 5 days of cough.  symptomps began with sore throat, followed by cough that became a yellow productive sputum.  Nasal congestion without facial pain.  There is no fever.  Cough worsens at night.  No sob or dyspnea.     Patient Active Problem List   Diagnosis Date Noted  . Glucose intolerance (impaired glucose tolerance) 05/27/2015  . Blood pressure elevated 05/27/2015  . Colon cancer screening 05/27/2015  . Need for shingles vaccine 05/27/2015  . Family history of Parkinson's disease 05/27/2015  . History of nephrolithiasis 05/27/2015    Past Medical History  Diagnosis Date  . Allergy   . Sinus infection   . Bronchitis   . Nephrolithiasis     Past Surgical History  Procedure Laterality Date  . Wisdom tooth extraction    . Lithotripsy    . Eye surgery      B cataracts removed.    Social History  Substance Use Topics  . Smoking status: Never Smoker   . Smokeless tobacco: Never Used  . Alcohol Use: 1.2 oz/week    2 Standard drinks or equivalent per week     Comment: wine - red    Family History  Problem Relation Age of Onset  . Heart attack Father   . Heart disease Father 67    AMI as cause of death  . Diabetes Father   . Parkinson's disease Brother   . Diabetes Brother   . Cancer Brother     melanoma retina  . Stroke Maternal Grandmother   . Cancer Mother 66    Hodgkins   . Glaucoma Mother   . Colon cancer Neg Hx   . Colon polyps Neg Hx     Allergies  Allergen Reactions  . Penicillins Swelling    childhood    Medication list has been reviewed and updated.  Current Outpatient Prescriptions on File Prior to Visit  Medication Sig Dispense  Refill  . aspirin 325 MG EC tablet Take 325 mg by mouth daily.    . magnesium 30 MG tablet Take 30 mg by mouth 2 (two) times daily.    . Multiple Vitamin (MULTIVITAMIN) capsule Take 1 capsule by mouth daily.    Marland Kitchen zoster vaccine live, PF, (ZOSTAVAX) 09811 UNT/0.65ML injection Inject 19,400 Units into the skin once. (Patient not taking: Reported on 08/27/2015) 0.65 mL 0   No current facility-administered medications on file prior to visit.    ROS ROS otherwise unremarkable unless listed above.   Physical Examination: BP 120/70 mmHg  Pulse 73  Temp(Src) 97.9 F (36.6 C) (Oral)  Resp 18  Ht 5' 9.75" (1.772 m)  Wt 213 lb 9.6 oz (96.888 kg)  BMI 30.86 kg/m2  SpO2 98% Ideal Body Weight: Weight in (lb) to have BMI = 25: 172.6  Physical Exam  Constitutional: He is oriented to person, place, and time. He appears well-developed and well-nourished. No distress.  HENT:  Head: Atraumatic.  Right Ear: Tympanic membrane, external ear and ear canal normal.  Left Ear: Tympanic membrane, external ear and ear canal normal.  Nose: Mucosal edema and rhinorrhea present.  Right sinus exhibits no maxillary sinus tenderness and no frontal sinus tenderness. Left sinus exhibits no maxillary sinus tenderness and no frontal sinus tenderness.  Mouth/Throat: No uvula swelling. No oropharyngeal exudate, posterior oropharyngeal edema or posterior oropharyngeal erythema.  Eyes: Conjunctivae, EOM and lids are normal. Pupils are equal, round, and reactive to light. Right eye exhibits normal extraocular motion. Left eye exhibits normal extraocular motion.  Neck: Trachea normal and full passive range of motion without pain. No edema and no erythema present.  Cardiovascular: Normal rate and regular rhythm.  Exam reveals no gallop and no friction rub.   No murmur heard. Pulmonary/Chest: Effort normal. No respiratory distress. He has no decreased breath sounds. He has no wheezes. He has no rhonchi.  Neurological: He is  alert and oriented to person, place, and time.  Skin: Skin is warm and dry. He is not diaphoretic.  Psychiatric: He has a normal mood and affect. His behavior is normal.     Assessment and Plan: Lance Stevens is a 69 y.o. male who is here today for cc of nasal congestion and cough.   -treating supportively at this time.  -zpak given for fill after 3-4 days if symptoms continue.  Alarming sxs discussed to warrant immediate return.     Acute upper respiratory infection - Plan: azithromycin (ZITHROMAX) 250 MG tablet, Guaifenesin (MUCINEX MAXIMUM STRENGTH) 1200 MG TB12, ipratropium (ATROVENT) 0.03 % nasal spray, chlorpheniramine-HYDROcodone (TUSSIONEX PENNKINETIC ER) 10-8 MG/5ML SUER   Ivar Drape, PA-C [facility] 11/27/2015 2:34 PM

## 2015-12-09 ENCOUNTER — Encounter: Payer: Self-pay | Admitting: Physician Assistant

## 2015-12-09 ENCOUNTER — Ambulatory Visit (INDEPENDENT_AMBULATORY_CARE_PROVIDER_SITE_OTHER): Payer: Medicare Other | Admitting: Physician Assistant

## 2015-12-09 VITALS — BP 130/76 | HR 73 | Temp 97.4°F | Resp 16 | Ht 69.0 in | Wt 210.0 lb

## 2015-12-09 DIAGNOSIS — E119 Type 2 diabetes mellitus without complications: Secondary | ICD-10-CM

## 2015-12-09 DIAGNOSIS — Z7689 Persons encountering health services in other specified circumstances: Secondary | ICD-10-CM

## 2015-12-09 DIAGNOSIS — R7302 Impaired glucose tolerance (oral): Secondary | ICD-10-CM

## 2015-12-09 DIAGNOSIS — Z1159 Encounter for screening for other viral diseases: Secondary | ICD-10-CM

## 2015-12-09 DIAGNOSIS — Z7189 Other specified counseling: Secondary | ICD-10-CM

## 2015-12-09 DIAGNOSIS — R3915 Urgency of urination: Secondary | ICD-10-CM

## 2015-12-09 DIAGNOSIS — Z23 Encounter for immunization: Secondary | ICD-10-CM

## 2015-12-09 DIAGNOSIS — N529 Male erectile dysfunction, unspecified: Secondary | ICD-10-CM

## 2015-12-09 DIAGNOSIS — N3941 Urge incontinence: Secondary | ICD-10-CM

## 2015-12-09 LAB — COMPREHENSIVE METABOLIC PANEL
ALK PHOS: 91 U/L (ref 40–115)
ALT: 32 U/L (ref 9–46)
AST: 12 U/L (ref 10–35)
Albumin: 4.5 g/dL (ref 3.6–5.1)
BUN: 18 mg/dL (ref 7–25)
CALCIUM: 9.4 mg/dL (ref 8.6–10.3)
CHLORIDE: 101 mmol/L (ref 98–110)
CO2: 28 mmol/L (ref 20–31)
Creat: 1.1 mg/dL (ref 0.70–1.25)
GLUCOSE: 118 mg/dL — AB (ref 65–99)
POTASSIUM: 4.3 mmol/L (ref 3.5–5.3)
Sodium: 140 mmol/L (ref 135–146)
Total Bilirubin: 1.1 mg/dL (ref 0.2–1.2)
Total Protein: 6.8 g/dL (ref 6.1–8.1)

## 2015-12-09 LAB — POC MICROSCOPIC URINALYSIS (UMFC): MUCUS RE: ABSENT

## 2015-12-09 LAB — LIPID PANEL
CHOL/HDL RATIO: 8 ratio — AB (ref <=5.0)
CHOLESTEROL: 200 mg/dL (ref 125–200)
HDL: 25 mg/dL — ABNORMAL LOW (ref >=40)
LDL Cholesterol: 103 mg/dL (ref <130)
TRIGLYCERIDES: 360 mg/dL — AB (ref <150)
VLDL: 72 mg/dL — AB (ref <30)

## 2015-12-09 LAB — POCT URINALYSIS DIP (MANUAL ENTRY)
BILIRUBIN UA: NEGATIVE
Bilirubin, UA: NEGATIVE
Glucose, UA: NEGATIVE
Leukocytes, UA: NEGATIVE
Nitrite, UA: NEGATIVE
PROTEIN UA: NEGATIVE
RBC UA: NEGATIVE
SPEC GRAV UA: 1.02
Urobilinogen, UA: 0.2
pH, UA: 5

## 2015-12-09 LAB — POCT GLYCOSYLATED HEMOGLOBIN (HGB A1C): HEMOGLOBIN A1C: 6.8

## 2015-12-09 LAB — GLUCOSE, POCT (MANUAL RESULT ENTRY): POC GLUCOSE: 127 mg/dL — AB (ref 70–99)

## 2015-12-09 MED ORDER — ZOSTER VACCINE LIVE 19400 UNT/0.65ML ~~LOC~~ SOLR: 0.6500 mL | Freq: Once | SUBCUTANEOUS | Status: DC

## 2015-12-09 MED ORDER — SILDENAFIL CITRATE 100 MG PO TABS: 100.0000 mg | ORAL_TABLET | Freq: Every day | ORAL | Status: DC | PRN

## 2015-12-09 NOTE — Patient Instructions (Addendum)
I will contact you with your lab results as soon as they are available.   If you have not heard from me in 2 weeks, please contact me.  The fastest way to get your results is to register for My Chart (see the instructions on the last page of this printout).  Keep up the GREAT work!  Keeping you healthy  Get these tests  Blood pressure- Have your blood pressure checked once a year by your healthcare provider.  Normal blood pressure is 120/80  Weight- Have your body mass index (BMI) calculated to screen for obesity.  BMI is a measure of body fat based on height and weight. You can also calculate your own BMI at ViewBanking.si.  Cholesterol- Have your cholesterol checked every year.  Diabetes- Have your blood sugar checked regularly if you have high blood pressure, high cholesterol, have a family history of diabetes or if you are overweight.  Screening for Colon Cancer- Colonoscopy starting at age 36.  Screening may begin sooner depending on your family history and other health conditions. Follow up colonoscopy as directed by your Gastroenterologist.  Screening for Prostate Cancer- Both blood work (PSA) and a rectal exam help screen for Prostate Cancer.  Screening begins at age 19 with African-American men and at age 56 with Caucasian men.  Screening may begin sooner depending on your family history.  Take these medicines  Aspirin- One aspirin daily can help prevent Heart disease and Stroke.  Flu shot- Every fall.  Tetanus- Every 10 years.  Zostavax- Once after the age of 60 to prevent Shingles.  Pneumonia shot- Once after the age of 31; if you are younger than 34, ask your healthcare provider if you need a Pneumonia shot.  Take these steps  Don't smoke- If you do smoke, talk to your doctor about quitting.  For tips on how to quit, go to www.smokefree.gov or call 1-800-QUIT-NOW.  Be physically active- Exercise 5 days a week for at least 30 minutes.  If you are not already  physically active start slow and gradually work up to 30 minutes of moderate physical activity.  Examples of moderate activity include walking briskly, mowing the yard, dancing, swimming, bicycling, etc.  Eat a healthy diet- Eat a variety of healthy food such as fruits, vegetables, low fat milk, low fat cheese, yogurt, lean meant, poultry, fish, beans, tofu, etc. For more information go to www.thenutritionsource.org  Drink alcohol in moderation- Limit alcohol intake to less than two drinks a day. Never drink and drive.  Dentist- Brush and floss twice daily; visit your dentist twice a year.  Depression- Your emotional health is as important as your physical health. If you're feeling down, or losing interest in things you would normally enjoy please talk to your healthcare provider.  Eye exam- Visit your eye doctor every year.  Safe sex- If you may be exposed to a sexually transmitted infection, use a condom.  Seat belts- Seat belts can save your life; always wear one.  Smoke/Carbon Monoxide detectors- These detectors need to be installed on the appropriate level of your home.  Replace batteries at least once a year.  Skin cancer- When out in the sun, cover up and use sunscreen 15 SPF or higher.  Violence- If anyone is threatening you, please tell your healthcare provider.  Living Will/ Health care power of attorney- Speak with your healthcare provider and family.

## 2015-12-09 NOTE — Progress Notes (Signed)
Subjective:   Patient ID: Lance Stevens, male     DOB: 31-Oct-1946, 70 y.o.    MRN: [id number]  PCP: No primary care provider on file.  Chief Complaint  Patient presents with  . estab care    "Physician"    HPI  Presents to establish for primary care.  In June he was diagnosed with diabetes. A1C was 7%.  He has been making lifestyle changes and has lost some weight. Target goal 180 lbs. Plans to exercise a little bit more.  Father died of AMI.  Older brother had Parkinson's, died of complications of that at age 56. Not quite as steady as he used to be. But not falling except occasionally gets tripped on roots and foliage in their large yard. No gait changes. No tremor, no slowing (his baseline is slower and more deliberate than others),  No shrinkage of his handwriting. No blunting of facial expressions  Colonoscopy is current. Agrees to Flu vaccine and to obtain the shingles vaccine. As it is not covered, he declines Td today.  Requests PSA.   Prior to Admission medications   Medication Sig Start Date End Date Taking? Authorizing Provider  aspirin 325 MG EC tablet Take 325 mg by mouth daily.   Yes Historical Provider, MD  magnesium 30 MG tablet Take 30 mg by mouth 2 (two) times daily.   Yes Historical Provider, MD  Multiple Vitamin (MULTIVITAMIN) capsule Take 1 capsule by mouth daily. Reported on 12/09/2015    Historical Provider, MD  zoster vaccine live, PF, (ZOSTAVAX) 60454 UNT/0.65ML injection Inject 19,400 Units into the skin once. Patient not taking: Reported on 08/27/2015 05/07/15   Wardell Honour, MD     Allergies  Allergen Reactions  . Penicillins Swelling    childhood     Patient Active Problem List   Diagnosis Date Noted  . Glucose intolerance (impaired glucose tolerance) 05/27/2015  . Blood pressure elevated 05/27/2015  . Colon cancer screening 05/27/2015  . Need for shingles vaccine 05/27/2015  . Family history of Parkinson's disease 05/27/2015    . History of nephrolithiasis 05/27/2015     Family History  Problem Relation Age of Onset  . Heart attack Father   . Heart disease Father 66    AMI as cause of death  . Diabetes Father   . Hyperlipidemia Father   . Parkinson's disease Brother   . Diabetes Brother   . Cancer Brother     melanoma retina  . Heart disease Brother   . Stroke Maternal Grandmother   . Cancer Mother 68    Hodgkins   . Glaucoma Mother   . Colon cancer Neg Hx   . Colon polyps Neg Hx      Social History   Social History  . Marital Status: Married    Spouse Name: [name]  . Number of Children: 3  . Years of Education: N/A   Occupational History  . 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 Smoking status: Never Smoker   . Smokeless tobacco: Never Used  . Alcohol Use: 1.2 oz/week    2 Standard drinks or equivalent per week     Comment: wine - red  . Drug Use: No  . Sexual Activity: Not on file   Other Topics Concern  . Not on file   Social History Narrative   Marital status: married x 25 years; second marriage; first marriage x  12 years.      Children: 3 daughters and one step-daughter; 4 grandchildren      Lives: with wife.      Employment: semi-retired; Company secretary; professor; Social worker, Probation officer.      Tobacco:  no      Alcohol: about 2 glasses of wine/week      Exercise: active in the yard.        Review of Systems  Constitutional: Positive for activity change (increased). Negative for fever, chills, diaphoresis, appetite change, fatigue and unexpected weight change.  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 Negative for dental problem, drooling, ear discharge, ear pain, facial swelling, hearing loss, mouth sores, nosebleeds, postnasal drip, rhinorrhea, sinus pressure, sneezing, sore throat, tinnitus and trouble swallowing.    Eyes: Negative.   Respiratory: Negative.   Cardiovascular: Negative.   Gastrointestinal: Negative.   Endocrine: Negative.   Genitourinary: Positive for urgency. Negative for dysuria, frequency, hematuria, flank pain, decreased urine volume, discharge, penile swelling, scrotal swelling, enuresis, difficulty urinating, genital sores, penile pain and testicular pain.       Erectile dysfunction. Previously used Viagra with good results. Requests it again.  Musculoskeletal: Negative.   Skin: Negative.   Allergic/Immunologic: Negative.   Neurological: Negative.   Hematological: Negative.   Psychiatric/Behavioral: Negative.          Objective:  Physical Exam  Constitutional: He is oriented to person, place, and time. Vital signs are normal. He appears well-developed and well-nourished. He is active and cooperative. No distress.  BP 130/76 mmHg  Pulse 73  Temp(Src) 97.4 F (36.3 C) (Oral)  Resp 16  Ht 5\' 9"  (1.753 m)  Wt 210 lb (95.255 kg)  BMI 31.00 kg/m2  SpO2 98%  HENT:  Head: Normocephalic and atraumatic.  Right Ear: Hearing normal.  Left Ear: Hearing normal.  Eyes: Conjunctivae are normal. No scleral icterus.  Neck: Normal range of motion. Neck supple. No thyromegaly present.  Cardiovascular: Normal rate, regular rhythm and normal heart sounds.   Pulses:      Radial pulses are 2+ on the right side, and 2+ on the left side.  Pulmonary/Chest: Effort normal and breath sounds normal.  Lymphadenopathy:       Head (right side): No tonsillar, no preauricular, no posterior auricular and no occipital adenopathy present.       Head (left side): No tonsillar, no preauricular, no posterior auricular and no occipital adenopathy present.    He has no cervical adenopathy.       Right: No supraclavicular adenopathy present.       Left: No supraclavicular adenopathy present.  Neurological: He is alert and oriented to person, place, and time. No sensory deficit.  Skin: Skin is warm, dry  and intact. No rash noted. No cyanosis or erythema. Nails show no clubbing.  Psychiatric: He has a normal mood and affect. His speech is normal and behavior is normal. Judgment and thought content normal. Cognition and memory are normal.    Results for orders placed or performed in visit on 12/09/15  POCT glucose (manual entry)  Result Value Ref Range   POC Glucose 127 (A) 70 - 99 mg/dl  POCT glycosylated hemoglobin (Hb A1C)  Result Value Ref Range   Hemoglobin A1C 6.8             Assessment & Plan:  1. Encounter to establish care  2. Glucose intolerance (impaired glucose tolerance) Diabetes mellitus type  2. Doing well with lifestyle changes presently. Plan to start ACEI and statin once remaining lab results reviewed. Continue daily low-dose ASA. - POCT glucose (manual entry) - POCT glycosylated hemoglobin (Hb A1C) - Comprehensive metabolic panel - Lipid panel  3. Need for shingles vaccine - zoster vaccine live, PF, (ZOSTAVAX) 96295 UNT/0.65ML injection; Inject 19,400 Units into the skin once.  Dispense: 0.65 mL; Refill: 0  4. Need for influenza vaccination - Flu Vaccine QUAD 36+ mos IM  5. Need for TD vaccine Declines because not covered by insurance.  6. Need for hepatitis C screening test - Hepatitis C antibody  7. Urge incontinence of urine If UCx is negative, plan trial of Flomax. - POCT urinalysis dipstick - POCT Microscopic Urinalysis ([facility]) - Urine culture - PSA  8. Erectile dysfunction, unspecified erectile dysfunction type Try 1/2 tablet first.  - sildenafil (VIAGRA) 100 MG tablet; Take 1 tablet (100 mg total) by mouth daily as needed for erectile dysfunction.  Dispense: 10 tablet; Refill: 3  Return in about 4 months (around 04/07/2016) for re-evaluation of diabetes.  Fara Chute, PA-C Physician Assistant-Certified [facility]

## 2015-12-10 LAB — URINE CULTURE
Colony Count: NO GROWTH
Organism ID, Bacteria: NO GROWTH

## 2015-12-10 LAB — HEPATITIS C ANTIBODY: HCV Ab: NEGATIVE

## 2015-12-10 LAB — PSA: PSA: 11.77 ng/mL — ABNORMAL HIGH (ref <=4.00)

## 2015-12-11 ENCOUNTER — Telehealth: Payer: Self-pay | Admitting: Physician Assistant

## 2015-12-11 DIAGNOSIS — E781 Pure hyperglyceridemia: Secondary | ICD-10-CM | POA: Insufficient documentation

## 2015-12-11 DIAGNOSIS — R972 Elevated prostate specific antigen [PSA]: Secondary | ICD-10-CM

## 2015-12-11 MED ORDER — FENOFIBRATE 160 MG PO TABS: 160.0000 mg | ORAL_TABLET | Freq: Every day | ORAL | Status: DC

## 2015-12-11 NOTE — Telephone Encounter (Signed)
LM for patient to call back regarding lab results.  1. PSA is elevated. Was 3.81 in 08/2011. Now 11.77. He needs to see urology for further evaluation. We should hold of treatment of his urinary symptoms until he sees the urologist.  I have made the referral. [facility] will contact him directly.  2. TG are elevated. I see that in 2012, Dr. Darron Doom prescribed Lopid. I recommend a similar, but once daily medication: fenofibrate. I have sent the prescription to his pharmacy.  We need to recheck the level in 3 months.

## 2015-12-11 NOTE — Telephone Encounter (Signed)
Spoke with patient. Advised of results and plan. Also discussed diagnosis of Diabetes, and consideration of changing lipid lowering treatment to a statin.  Follow-up appointment is scheduled for 4 months from now.

## 2015-12-16 ENCOUNTER — Telehealth: Payer: Self-pay

## 2015-12-16 NOTE — Telephone Encounter (Signed)
error 

## 2015-12-18 ENCOUNTER — Telehealth: Payer: Self-pay

## 2015-12-18 NOTE — Telephone Encounter (Signed)
The patient called to ask Chelle if an appointment date of 01/21/16 at [facility] is okay given his diagnosis of a high PSA level.  He was unsure if it was a typical appointment time frame or not.  He said he can be placed on a waiting list also.  Please advise, thank you.

## 2015-12-19 NOTE — Telephone Encounter (Signed)
The wait is OK-that's actually the right time frame for rechecking the PSA.

## 2015-12-22 NOTE — Telephone Encounter (Signed)
SPoke with pt, advised message. Pt agreed.

## 2016-02-04 ENCOUNTER — Other Ambulatory Visit: Payer: Self-pay | Admitting: Urology

## 2016-02-04 DIAGNOSIS — C61 Malignant neoplasm of prostate: Secondary | ICD-10-CM

## 2016-02-19 ENCOUNTER — Ambulatory Visit (HOSPITAL_COMMUNITY)
Admission: RE | Admit: 2016-02-19 | Discharge: 2016-02-19 | Disposition: A | Discharge status: Home / Self Care | Payer: Medicare Other | Source: Ambulatory Visit | Attending: Urology | Admitting: Urology

## 2016-02-19 DIAGNOSIS — R938 Abnormal findings on diagnostic imaging of other specified body structures: Secondary | ICD-10-CM | POA: Insufficient documentation

## 2016-02-19 DIAGNOSIS — C61 Malignant neoplasm of prostate: Secondary | ICD-10-CM

## 2016-02-19 MED ORDER — TECHNETIUM TC 99M MEDRONATE IV KIT
25.0000 | PACK | Freq: Once | INTRAVENOUS | Status: AC | PRN
Start: 2016-02-19 — End: 2016-02-19
  Administered 2016-02-19: 27 via INTRAVENOUS

## 2016-03-09 ENCOUNTER — Telehealth: Payer: Self-pay | Admitting: Medical Oncology

## 2016-03-09 ENCOUNTER — Encounter: Payer: Self-pay | Admitting: Medical Oncology

## 2016-03-09 NOTE — Telephone Encounter (Signed)
Oncology Nurse Navigator Documentation  Oncology Nurse Navigator Flowsheets 03/09/2016  Navigator Location [facility]  Navigator Encounter Type Introductory phone Programmer, multimedia Needs Education  Education Understanding Cancer/ Treatment Options;Newly Diagnosed Cancer Education  Interventions Education Method  Education Method Teach-back;Verbal;Written  Support Groups/Services Friends and Family  Acuity Level 2  Acuity Level 2 Initial guidance, education and coordination as needed;Educational needs  Time Spent with Patient 30   I called pt to introduce myself as the Prostate Nurse Navigator and the Coordinator of the Prostate [place].  1. I confirmed with the patient he is aware of his referral to the clinic April 14 th arriving at 7:45am.   2. I discussed the format of the clinic and the physicians he will be seeing that day.  3. I discussed where the clinic is located and how to contact me.  4. I confirmed his address and informed him I would be mailing a packet of information and forms to be completed. I asked him to bring them with him the day of his appointment.   He voiced understanding of the above. I asked him to call me if he has any questions or concerns regarding his appointments or the forms he needs to complete.

## 2016-03-15 ENCOUNTER — Encounter: Payer: Self-pay | Admitting: Medical Oncology

## 2016-03-17 ENCOUNTER — Encounter: Payer: Self-pay | Admitting: Radiation Oncology

## 2016-03-17 NOTE — Progress Notes (Signed)
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 Tamala Julian for an elevated PSA.  Biopsies of prostate revealed:    Past/Anticipated interventions by urology, if [id number], CT, bone scan, discussion about surgical intervention,  referral to [facility]  Past/Anticipated interventions by medical oncology, if any: no  Weight changes, if any: no  Bowel/Bladder complaints, if any: Reports occasional urgency, nocturia x 2-4, occasional frequency, and ED. Denies dysuria or hematuria.    Nausea/Vomiting, if any: no  Pain issues, if any:  no  SAFETY ISSUES:  Prior radiation? no  Pacemaker/ICD? no  Possible current pregnancy? no  Is the patient on methotrexate? no  Current Complaints / other details:  70 year old male. Works as a Horticulturist, commercial. CT and bone scan negative. Married with three daughters. [id number].

## 2016-03-18 ENCOUNTER — Encounter: Payer: Self-pay | Admitting: Medical Oncology

## 2016-03-19 ENCOUNTER — Ambulatory Visit (HOSPITAL_BASED_OUTPATIENT_CLINIC_OR_DEPARTMENT_OTHER): Payer: Medicare Other | Admitting: Oncology

## 2016-03-19 ENCOUNTER — Ambulatory Visit
Admission: RE | Admit: 2016-03-19 | Discharge: 2016-03-19 | Disposition: A | Discharge status: Home / Self Care | Payer: Medicare Other | Source: Ambulatory Visit | Attending: Radiation Oncology | Admitting: Radiation Oncology

## 2016-03-19 ENCOUNTER — Encounter: Payer: Self-pay | Admitting: Medical Oncology

## 2016-03-19 VITALS — BP 159/70 | HR 59 | Resp 16 | Ht 69.0 in | Wt 219.4 lb

## 2016-03-19 DIAGNOSIS — C61 Malignant neoplasm of prostate: Secondary | ICD-10-CM | POA: Diagnosis not present

## 2016-03-19 DIAGNOSIS — Z7982 Long term (current) use of aspirin: Secondary | ICD-10-CM | POA: Diagnosis not present

## 2016-03-19 DIAGNOSIS — Z808 Family history of malignant neoplasm of other organs or systems: Secondary | ICD-10-CM

## 2016-03-19 DIAGNOSIS — Z8249 Family history of ischemic heart disease and other diseases of the circulatory system: Secondary | ICD-10-CM

## 2016-03-19 DIAGNOSIS — Z807 Family history of other malignant neoplasms of lymphoid, hematopoietic and related tissues: Secondary | ICD-10-CM | POA: Diagnosis not present

## 2016-03-19 DIAGNOSIS — R9721 Rising PSA following treatment for malignant neoplasm of prostate: Secondary | ICD-10-CM | POA: Insufficient documentation

## 2016-03-19 DIAGNOSIS — Z87442 Personal history of urinary calculi: Secondary | ICD-10-CM | POA: Diagnosis not present

## 2016-03-19 DIAGNOSIS — I10 Essential (primary) hypertension: Secondary | ICD-10-CM | POA: Diagnosis not present

## 2016-03-19 DIAGNOSIS — Z83511 Family history of glaucoma: Secondary | ICD-10-CM

## 2016-03-19 HISTORY — DX: Malignant neoplasm of prostate: C61

## 2016-03-19 NOTE — Consult Note (Signed)
Chief Complaint  Prostate Cancer   Reason For Visit  Reason for consult: To discuss treatment options for high risk prostate cancer. Physician requesting consult: Dr. Eda Keys PCP: Daphane Shepherd, PA-C Location of consult: [facility]   History of Present Illness     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  He has a past medical history of diabetes, gout, hyperlipidemia, hypertension, and kidney stones.  He was noted to have an elevated screening PSA of 11.7 by his PCP prompting a referral to Dr. Junious Silk.  His repeat PSA remained elevated at 8.13 and he was noted to have a nodule at the left apex of the prostate on exam.  He underwent a TRUS biopsy of the prostate on 01/30/16 that demonstrated a hypoechoic area at the left apex and his pathology confirmed Gleason 4+5=9 adenocarcinoma of the prostate with 6 out of 12 biopsy cores positive for malignancy with his high grade disease noted at the left apex as well.  He has no family history of prostate cancer. He underwent staging studies on 02/19/16 including a CT scan of the pelvis that was negative for lymphadenopathy or other evidence of metastatic disease and a bone scan that demonstrated uptake on the 4th and 9th ribs.  A CT of the chest determined these areas to demonstrate cortical thickening thought to most likely be benign consistent with trauma vs. Paget's disease.  His bone scan also demonstrated uptake at the L3 vertebral body although this has not been further evaluated.  He has a history of calcium oxalate stones s/p ESWL in September 2008.  TNM stage: cT2a N0 M0 (left apex) PSA: 8.13 Gleason score: 4+5=9 Biopsy (01/30/16): 6/12 cores positive    Left: L lateral apex (70%, 4+5=9), L apex (80%, 4+5=9), L lateral mid (60%, 3+3=6), L mid (5%, 3+3=6)    Right: R base (10%, 3+3=6), R lateral base (10%,  3+3=6) Prostate volume: 38 cc  Nomogram OC disease: 18% EPE: 79% SVI: 30% LNI: 27% PFS (surgery): 35% at 5 years, 22% at 10 years  Urinary function: He does have mild-to-moderate lower urinary tract symptoms.  IPSS is 9.  He has not required medical treatment. Erectile function: He does have mild-to-moderate erectile dysfunction.  SHIM score is 15.  His previously utilize Viagra although has not used this in many years.  He estimates that his ability to get an erection adequate for intercourse allows him to achieve an adequate outcome approximately 7 out of 10 times.   Past Medical History  1. History of Gout (M10.9)  2. History of diabetes mellitus (Z86.39)  3. History of hypercholesterolemia (Z86.39)  4. History of hypertension (Z86.79)  5. History of kidney stones (J57.017)  Surgical History  1. History of Lithotripsy  2. History of Tonsillectomy  Current Meds  1. Aspirin 325 MG Oral Tablet;  Therapy: (Recorded:15Feb2017) to Recorded  2. Fenofibrate 160 MG Oral Tablet;  Therapy: (Recorded:15Feb2017) to Recorded  3. MegaRed Omega-3 Krill Oil CAPS;  Therapy: (Recorded:15Feb2017) to Recorded  4. Senior Mens Formula CAPS;  Therapy: (Recorded:15Feb2017) to Recorded  Allergies  1. Penicillins  Family History  1. Family history of Death In The Family Father  2. Family history of Death In The Family Mother  3. Family history of Family Health Status Number Of Children  4. Family history of Hodgkin's lymphoma (Z80.7) : Mother  5. Family history of  myocardial infarction (Z82.49) : Father  6. Family history of Parkinson's disease (Z82.0) : Brother  Social History   Marital History - Currently Married   Retired   Three children   Denied: History of Tobacco Use  Review of Systems AU Complete-Male: Genitourinary, constitutional, skin, eye, otolaryngeal, hematologic/lymphatic, cardiovascular, pulmonary, endocrine, musculoskeletal, gastrointestinal, neurological and  psychiatric system(s) were reviewed and pertinent findings if present are noted and are otherwise negative.    Physical Exam Constitutional: Well nourished and well developed . No acute distress.  ENT:. The ears and nose are normal in appearance.  Neck: The appearance of the neck is normal and no neck mass is present.  Pulmonary: No respiratory distress, normal respiratory rhythm and effort and clear bilateral breath sounds.  Cardiovascular: Heart rate and rhythm are normal . No peripheral edema.  Abdomen: The abdomen is mildly obese. The abdomen is soft and nontender. No masses are palpated. No CVA tenderness. No hernias are palpable. No hepatosplenomegaly noted.  Rectal: Rectal exam demonstrates normal sphincter tone, no tenderness and no masses. He does have a firm 1.5; nodule toward the left apex of the prostate potentially extending toward the extraprostatic tissues. The prostate is not tender. The left seminal vesicle is nonpalpable. The right seminal vesicle is nonpalpable. The perineum is normal on inspection.  Lymphatics: The femoral and inguinal nodes are not enlarged or tender.  Skin: Normal skin turgor, no visible rash and no visible skin lesions.  Neuro/Psych:. Mood and affect are appropriate.    Results/Data Selected Results  CT-CHEST W/O CONTRAST [id number] 12:00AM Festus Aloe   Test Name Result Flag Reference  CT-CHEST W/O CONTRAST (Report)    ** RADIOLOGY REPORT BY [facility] **   CLINICAL DATA: Followup rib abnormality seen on recent bone scan.  EXAM: CT CHEST WITHOUT CONTRAST  TECHNIQUE: Multidetector CT imaging of the chest was performed following the standard protocol without IV contrast.  COMPARISON: Whole bone scan 02/19/2016  FINDINGS: Mediastinum/Nodes: No chest wall mass, supraclavicular or axillary lymphadenopathy.  The heart is normal in size. No pericardial effusion. 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 Scattered aortic calcifications.  Three-vessel coronary artery calcifications.  No mediastinal or hilar mass or adenopathy. Esophagus is grossly normal.  Lungs/Pleura: No acute pulmonary findings. No worrisome pulmonary lesions. No pleural effusion.  Upper abdomen: No significant upper abdominal findings.  Musculoskeletal: The abnormalities the on the bone scan correlate 2 areas of marked cortical thickening involving the fourth right posterior rib and also the ninth left posterior rib. This is not have the appearance of prostate cancer. This is most likely some type of hyperostotic process and could be related to remote trauma. Paget's disease would be another possibility. There are also diffuse degenerative changes involving the thoracic spine with osteophytosis and DISH.  Severe degenerate disc disease is barely covered at L2-3 but may account for the abnormality on the bone scan.  IMPRESSION: 1. The findings on the bone scan are un likely due to prostate cancer. This is more likely remote trauma or other hyperostotic process such as Paget's disease. 2. 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 3. No findings suspicious for metastatic prostate cancer. 4. Three vessel coronary artery calcifications.   Electronically Signed  By: Marijo Sanes M.D.  On: 02/25/2016 17:27   BUN & CREATININE [id number] 09:42AM Festus Aloe  SPECIMEN TYPE: BLOOD   Test Name Result Flag Reference  CREATININE 1.20 mg/dL  0.50-1.50  BUN 23  mg/dL  6-30  Est GFR, African American 71 mL/min  >=60  PERFORMED AT:        [facility]                      [address]                      [place], [place] [address]  Est GFR, NonAfrican American 61 mL/min  >=60  THE ESTIMATED GFR IS A CALCULATION VALID FOR ADULTS (>=55 YEARS OLD) THAT USES THE CKD-EPI ALGORITHM TO ADJUST FOR AGE AND SEX. IT IS   NOT TO BE USED FOR CHILDREN, PREGNANT  WOMEN, HOSPITALIZED PATIENTS,    PATIENTS ON DIALYSIS, OR WITH RAPIDLY CHANGING KIDNEY FUNCTION. ACCORDING TO THE NKDEP, EGFR >89 IS NORMAL, 60-89 SHOWS MILD IMPAIRMENT, 30-59 SHOWS MODERATE IMPAIRMENT, 15-29 SHOWS SEVERE IMPAIRMENT AND <15 IS ESRD.   CT-PELVIS WITH CONTRAST [id number] 12:00AM Festus Aloe   Test Name Result Flag Reference  CT-PELVIS WITH CONTRAST (Report)    ** RADIOLOGY REPORT BY [facility] **   CLINICAL DATA: Newly diagnosed prostate cancer, elevated PSA  EXAM: CT PELVIS WITH CONTRAST  TECHNIQUE: Multidetector CT imaging of the pelvis was performed using the standard protocol following the bolus administration of intravenous contrast.  CONTRAST: 100 mL Isovue 300 IV  COMPARISON: CT abdomen pelvis dated 08/20/2007  FINDINGS: Urinary Tract: Bladder is within normal limits.  Bowel: Visualized bowel is unremarkable.  Normal appendix (series 2/ image 21).  Vascular/Lymphatic: Mild atherosclerotic calcifications of the infrarenal abdominal aorta.  No suspicious abdominopelvic lymphadenopathy. Small bilateral external iliac/inguinal nodes with preservation of the normal fatty hilum.  Reproductive: Prostate is grossly unremarkable, noting mildly heterogeneous enhancement along the left posterolateral apex of the peripheral gland  Other: No pelvic ascites.  Fat within the bilateral inguinal canals.  Musculoskeletal: Degenerative changes of the lower lumbar spine.  No focal osseous lesions.  IMPRESSION: Prostate is grossly unremarkable in this patient with known prostate cancer.  No evidence of metastatic disease in the pelvis.  Correlate with pending bone scan.   Electronically Signed  By: Julian Hy M.D.  On: 02/19/2016 12:23     I have independently reviewed his medical records, PSA results, pathology slides.  I also independently reviewed his imaging studies including a CT scan of the pelvis, bone scan, and CT  scan of chest.  Findings are as dictated above.  Assessment  1. Prostate cancer ([facility])  Discussion/Summary  1.  High-risk prostate cancer: I had a detailed discussion with Mr. Tweed and his wife today regarding his options for treatment.  We did discuss the need to proceed with additional evaluation of his L3 abnormality on his bone scan.  Assuming that he does not have metastatic disease, it was strongly recommended that he proceed with therapy of curative intent with either primary surgery or androgen deprivation and external beam radiation therapy.  He understands the possibility that he likely will require multimodality treatment.   The patient was counseled about the natural history of prostate cancer and the standard treatment options that are available for prostate cancer. It was explained to him how his age and life expectancy, clinical stage, Gleason score, and PSA affect his prognosis, the decision to proceed with additional staging studies, as well as how that information influences recommended treatment strategies. We discussed the roles for active surveillance, radiation therapy, surgical therapy, androgen deprivation, as well as ablative therapy options for the treatment  of prostate cancer as appropriate to his individual cancer situation. We discussed the risks and benefits of these options with regard to their impact on cancer control and also in terms of potential adverse events, complications, and impact on quiality of life particularly related to urinary, bowel, and sexual function. The patient was encouraged to ask questions throughout the discussion today and all questions were answered to his stated satisfaction. In addition, the patient was provided with and/or directed to appropriate resources and literature for further education about prostate cancer and treatment options.   We discussed surgical therapy for prostate cancer including the different available surgical approaches. We  discussed, in detail, the risks and expectations of surgery with regard to cancer control, urinary control, and erectile function as well as the expected postoperative recovery process. Additional risks of surgery including but not limited to bleeding, infection, hernia formation, nerve damage, lymphocele formation, bowel/rectal injury potentially necessitating colostomy, damage to the urinary tract resulting in urine leakage, urethral stricture, and the cardiopulmonary risks such as myocardial infarction, stroke, death, venothromboembolism, etc. were explained. The risk of open surgical conversion for robotic/laparoscopic prostatectomy was also discussed.   After discussion, we have agreed to proceed with plain films of his lumbar spine for further evaluation of the increased uptake on his bone scan.  He understands that he may report further evaluation with an MRI if his findings on plain films are equivocal.  If he does not have evidence of measurable metastatic disease and is a candidate for curative therapy, he would like to proceed with surgical treatment.  My tentative plan will be to perform a unilateral right nerve sparing robot-assisted laparoscopic radical prostatectomy and pelvic lymphadenectomy.  This will tentatively be scheduled pending completion of his metastatic evaluation.  A total of 65 minutes were spent in the overall care of the patient today with 55 minutes in direct face to face consultation.   Cc: Dr. Catalina Antigua Eskridge Daphane Shepherd, PA-C Dr. Tyler Pita Dr. Zola Button    Signatures Electronically signed by : Raynelle Bring, M.D.; Mar 19 2016  1:07PM EST

## 2016-03-19 NOTE — Progress Notes (Signed)
Radiation Oncology         [phone] ________________________________  Multidisciplinary Prostate Cancer Clinic  Initial Radiation Oncology Consultation  Name: Lance Stevens MRN: [id number]  Date: 03/19/2016  DOB: 1946-10-10  [id number], PA-C  Lance Bring, MD   REFERRING PHYSICIAN: Raynelle Bring, MD  DIAGNOSIS: 70 y.o. gentleman with stage T2a adenocarcinoma of the prostate with a Gleason's score of 4+5 and a PSA of 8.13 - High Risk    ICD-9-CM ICD-10-CM   1. Malignant neoplasm of prostate ([place]) Lance Stevens is a 70 y.o. gentleman.  He was noted to have an elevated PSA of 11.77 by his PCP, PA Lance Stevens on 12/09/15.  Accordingly, he was referred for evaluation in urology by Dr. Junious Stevens on 01/21/16,  digital rectal examination was performed at that time revealing some nodularity of the left apex. Repeat PSA check at that time was 8.13  The patient proceeded to transrectal ultrasound with 12 biopsies of the prostate on 01/30/16.  The prostate volume measured 38.02 cc.  Out of 12 core biopsies, six were positive.  The maximum Gleason score was 4+5, and this was seen in left lateral apex as well as the left apex.  CT scan of the pelvis with contrast on 02/19/16 showed no metastatic disease. Bone scan the same day showed hypermetabolic activity in the right 9th rib and left 4th rib that was suspicious for malignancy. It also demonstrated a transverse high-signal in the L3 vertebral level that could either be a compression fracture or malignancy. CT scan of the chest on 02/25/16 showed that the cortical thickening of the 4th right posterior rib and the 9th left posterior rib do not have the appearance of metastatic prostate cancer, but of remote trauma or other hyperostotic processes. Severe degenerate disc disease is barely covered at L2-3 and may account for the abnormality on the bone scan.  The patient reviewed the biopsy results with  his urologist and he has kindly been referred today to the multidisciplinary prostate cancer clinic for presentation of pathology and radiology studies in our conference for discussion of potential radiation treatment options and clinical evaluation.  PREVIOUS RADIATION THERAPY: No  PAST MEDICAL HISTORY:  has a past medical history of Allergy; Sinus infection; Bronchitis; Nephrolithiasis; and Prostate cancer ([place]).    PAST SURGICAL HISTORY: Past Surgical History  Procedure Laterality Date  . Wisdom tooth extraction    . Lithotripsy    . Eye surgery      B cataracts removed.  . Eye surgery      lens implants  . Tonsillectomy and adenoidectomy      also received radiation treatments, as was frequent in children at the time  . Prostate biopsy      FAMILY HISTORY: family history includes Cancer in his brother; Cancer (age of onset: 24) in his mother; Diabetes in his brother and father; Glaucoma in his mother; Heart attack in his father; Heart disease in his brother; Heart disease (age of onset: 63) in his father; Hyperlipidemia in his father; Parkinson's disease in his brother; Stroke in his maternal grandmother. There is no history of Colon cancer or Colon polyps.  SOCIAL HISTORY:  reports that he has never smoked. He has never used smokeless tobacco. He reports that he drinks about 1.2 oz of alcohol per week. He reports that he does not use illicit drugs.  ALLERGIES: Penicillins  MEDICATIONS:  Current Outpatient Prescriptions  Medication Sig Dispense Refill  .  aspirin 325 MG EC tablet Take 325 mg by mouth daily.    . fenofibrate 160 MG tablet Take 1 tablet (160 mg total) by mouth daily. 90 tablet 3  . ipratropium (ATROVENT) 0.03 % nasal spray Place 2 sprays into both nostrils 2 (two) times daily. (Patient not taking: Reported on 12/09/2015) 30 mL 0  . magnesium 30 MG tablet Take 30 mg by mouth 2 (two) times daily.    . Multiple Vitamin (MULTIVITAMIN) capsule Take 1 capsule by mouth  daily. Reported on 12/09/2015    . sildenafil (VIAGRA) 100 MG tablet Take 1 tablet (100 mg total) by mouth daily as needed for erectile dysfunction. 10 tablet 3  . zoster vaccine live, PF, (ZOSTAVAX) 16109 UNT/0.65ML injection Inject 19,400 Units into the skin once. 0.65 mL 0   No current facility-administered medications for this encounter.    REVIEW OF SYSTEMS:  A 15 point review of systems is documented in the electronic medical record. This was obtained by the nursing staff. However, I reviewed this with the patient to discuss relevant findings and make appropriate changes.  Pertinent items are noted in HPI. Pertinent items noted in HPI and remainder of comprehensive ROS otherwise negative..  The patient completed an IPSS and IIEF questionnaire.  His IPSS score was 9 indicating moderate urinary outflow obstructive symptoms.  He indicated that his erectile function is able to complete sexual activity on half of attempts.   PHYSICAL EXAM: This patient is in no acute distress.  He is alert and oriented.   vitals were not taken for this visit. He exhibits no respiratory distress or labored breathing.  He appears neurologically intact.  His mood is pleasant.  His affect is appropriate.  Please note the digital rectal exam findings described above.  KPS = 100  100 - Normal; no complaints; no evidence of disease. 90   - Able to carry on normal activity; minor signs or symptoms of disease. 80   - Normal activity with effort; some signs or symptoms of disease. 27   - Cares for self; unable to carry on normal activity or to do active work. 60   - Requires occasional assistance, but is able to care for most of his personal needs. 50   - Requires considerable assistance and frequent medical care. 22   - Disabled; requires special care and assistance. 21   - Severely disabled; hospital admission is indicated although death not imminent. 30   - Very sick; hospital admission necessary; active supportive  treatment necessary. 10   - Moribund; fatal processes progressing rapidly. 0     - Dead  Karnofsky DA, [place], [place] LS and [place] [facility] [phone]) The use of the nitrogen mustards in the palliative treatment of carcinoma: with particular reference to bronchogenic carcinoma Cancer 1 [phone]   LABORATORY DATA:  Lab Results  Component Value Date   WBC 12.0* 08/20/2007   HGB 14.4 08/20/2007   HCT 40.7 08/20/2007   MCV 90.4 08/20/2007   PLT 244 08/20/2007   Lab Results  Component Value Date   NA 140 12/09/2015   K 4.3 12/09/2015   CL 101 12/09/2015   CO2 28 12/09/2015   Lab Results  Component Value Date   ALT 32 12/09/2015   AST 12 12/09/2015   ALKPHOS 91 12/09/2015   BILITOT 1.1 12/09/2015     RADIOGRAPHY: Nm Bone Scan Whole Body  02/19/2016  CLINICAL DATA:  Recent diagnosis prostate cancer. Bone pain. Elevated PSA EXAM: NUCLEAR MEDICINE  WHOLE BODY BONE SCAN TECHNIQUE: Whole body anterior and posterior images were obtained approximately 3 hours after intravenous injection of radiopharmaceutical. RADIOPHARMACEUTICALS:  27 mCi Technetium-67m MDP IV COMPARISON:  02/19/2016 and 08/20/2007 FINDINGS: Abnormal activity in the right ninth rib medially over a fairly long segment of the rib. Abnormal activity in the left approximately fourth rib medially, over a relatively still long segment of the rib. A linear band of activity along the left sciatic notch appears correspond to a sclerotic spur on image 109 series 601 of the prior CT scan and accordingly is probably degenerative rather than metastatic. Transverse high activity at the L3 level, possibly from a compression fracture or malignancy. Degenerative findings at L4-5 and L5-S1, and likely in the mid thoracic spine slightly eccentric to the right. There degenerative findings in the shoulders, and sternoclavicular joints. Mildly accentuated activity in the left mandible. IMPRESSION: 1. Hypermetabolic activity medially in the right  approximately ninth rib and left approximately fourth rib, pattern suspicious for malignancy. 2. Transverse high-signal in the L3 vertebral level, possibly from a compression fracture or malignancy. 3. The transverse high signal along the left sciatic notches attributable to sclerotic spurring. Likewise I believe that the activity at the shoulders and sternoclavicular joints is most likely degenerative. 4. Accentuated activity in the left mandible, favor dental disease over malignancy. Electronically Signed   By: Van Clines M.D.   On: 02/19/2016 14:09      IMPRESSION: This gentleman is a 70 y.o. 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  His T-Stage, Gleason's Score, and PSA put him into the high risk group.  Accordingly he is eligible for a variety of potential treatment options including radical prostatectomy or external beam radiation.  PLAN: An MRI of the spine would be scheduled to rule out metastasis in the lumbar spine.  Today I reviewed the findings and workup thus far.  We discussed the natural history of prostate cancer.  We reviewed the the implications of T-stage, Gleason's Score, and PSA on decision-making and outcomes related to prostate cancer.  We discussed radiation treatment in the management of prostate cancer with regard to the logistics and delivery of external beam radiation treatment as well as the logistics and delivery of prostate brachytherapy.  We compared and contrasted each of these approaches and also compared these against prostatectomy.    The patient focused most of his questions and interest in robotic-assisted laparoscopic radical prostatectomy.  We discussed some of the potential advantages of surgery including surgical staging, the availability of salvage radiotherapy to the prostatic fossa, and the confidence associated with immediate biochemical response.  We discussed some of the potential proven  indications for postoperative radiotherapy including positive margins, extracapsular extension, and seminal vesicle involvement. We also talked about some of the other potential findings leading to a recommendation for radiotherapy including a non-zero postoperative PSA and positive lymph nodes.  The patient is leaning towards prostatectomy. I enjoyed meeting with him today, and will look forward to participating in the care of this very nice gentleman.  I will look forward to following his progress.  I spent 40 minutes face to face with the patient and more than 50% of that time was spent in counseling and/or coordination of care.   ------------------------------------------------  Sheral Apley. Tammi Klippel, M.D.  This document serves as a record of services personally performed by Tyler Pita, MD. It was created on his behalf by Darcus Austin, a  trained medical scribe. The creation of this record is based on the scribe's personal observations and the provider's statements to them. This document has been checked and approved by the attending provider.

## 2016-03-19 NOTE — Progress Notes (Signed)
                               Care Plan Summary  Name: Lance Stevens DOB: Sep 06, 1946   Your Medical Team:   Urologist -  Dr. Raynelle Bring, [facility]  Radiation Oncologist - Dr. Tyler Pita, [facility]   Medical Oncologist - Dr. Zola Button, [facility]  Recommendations: 1) Follow up x-rays of spine  2) Robotic Prostatecomy  3) Radiation with Androgen Deprivation (hormone therapy)  * These recommendations are based on information available as of today's consult.      Recommendations may change depending on the results of further tests or exams.   Next Steps: 1) Dr. Lyndal Rainbow office will schedule follow up x-rays of spine  2) Consider options and call Cira Rue, RN with decision   When appointments need to be scheduled, you will be contacted by [facility] and/or [facility].  Questions?  Please do not hesitate to call Cira Rue, RN, BSN, CRNI at [phone] any questions or concerns.  Shirlean Mylar is your Oncology Nurse Navigator and is available to assist you while you're receiving your medical care at [facility].

## 2016-03-19 NOTE — Progress Notes (Signed)
Reason for Referral: Prostate cancer.   HPI: 70 year old gentleman currently of [place] where he lived the majority of his life. He is a gentleman with history of diabetes, gout and hyperlipidemia for the most part and reasonably good health. He was found to have an elevated PSA in February 2017 with a PSA up to 8.13. Based on these findings he was evaluated by Dr. Junious Silk and underwent a biopsy on 01/30/2016. At that time he was found to have Gleason score 4+5 = 9 as well as Gleason score 3+3 = 6 in other cores. His staging workup showed no evidence of metastasis on his pelvic CT but his bone scan did show abnormalities in his right ribs as well as L3. These imaging studies were reviewed today with radiology and felt this could represent benign findings such as Paget's disease or previous trauma. Clinically, he reports limited urinary symptoms. He has some occasional urgency but at times some nocturia. He denied any dysuria or hematuria. His satisfied with his stream. Otherwise no other complaints.  He does not report any headaches, blurry vision, syncope or seizures. He does not report any fevers, chills or sweats. Does not report any cough, wheezing or hemoptysis. Does not report any nausea, vomiting or abdominal pain. Does not report any skeletal complaints of arthralgias or myalgias. Remaining review of systems unremarkable.    Past Medical History  Diagnosis Date  . Allergy   . Sinus infection   . Bronchitis   . Nephrolithiasis   . Prostate cancer [facility])   :  Past Surgical History  Procedure Laterality Date  . Wisdom tooth extraction    . Lithotripsy    . Eye surgery      B cataracts removed.  . Eye surgery      lens implants  . Tonsillectomy and adenoidectomy      also received radiation treatments, as was frequent in children at the time  . Prostate biopsy    :   Current outpatient prescriptions:  .  aspirin 325 MG EC tablet, Take 325 mg by mouth daily., Disp: , Rfl:  .   fenofibrate 160 MG tablet, Take 1 tablet (160 mg total) by mouth daily., Disp: 90 tablet, Rfl: 3 .  ipratropium (ATROVENT) 0.03 % nasal spray, Place 2 sprays into both nostrils 2 (two) times daily. (Patient not taking: Reported on 12/09/2015), Disp: 30 mL, Rfl: 0 .  magnesium 30 MG tablet, Take 30 mg by mouth 2 (two) times daily., Disp: , Rfl:  .  Multiple Vitamin (MULTIVITAMIN) capsule, Take 1 capsule by mouth daily. Reported on 12/09/2015, Disp: , Rfl:  .  sildenafil (VIAGRA) 100 MG tablet, Take 1 tablet (100 mg total) by mouth daily as needed for erectile dysfunction., Disp: 10 tablet, Rfl: 3 .  zoster vaccine live, PF, (ZOSTAVAX) 09811 UNT/0.65ML injection, Inject 19,400 Units into the skin once., Disp: 0.65 mL, Rfl: 0:  Allergies  Allergen Reactions  . Penicillins Swelling    childhood  :  Family History  Problem Relation Age of Onset  . Heart attack Father   . Heart disease Father 42    AMI as cause of death  . Diabetes Father   . Hyperlipidemia Father   . Parkinson's disease Brother   . Diabetes Brother   . Cancer Brother     melanoma retina  . Heart disease Brother   . Stroke Maternal Grandmother   . Cancer Mother 79    Hodgkins   . Glaucoma Mother   .  Colon cancer Neg Hx   . Colon polyps Neg Hx   :  Social History   Social History  . Marital Status: Married    Spouse Name: [name]  . Number of Children: 3  . Years of Education: N/A   Occupational History  . 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 Smoking status: Never Smoker   . Smokeless tobacco: Never Used  . Alcohol Use: 1.2 oz/week    2 Standard drinks or equivalent per week     Comment: wine - red  . Drug Use: No  . Sexual Activity: Yes   Other Topics Concern  . Not on file   Social History Narrative   Marital status: married x 25 years; second marriage; first marriage x 12 years.      Children: 3 daughters and one step-daughter; 4  grandchildren      Lives: with wife.      Employment: semi-retired; Company secretary; professor; Social worker, Probation officer.      Tobacco:  no      Alcohol: about 2 glasses of wine/week      Exercise: active in the yard.  :  Pertinent items are noted in HPI.  Exam: There were no vitals taken for this visit. General appearance: alert and cooperative Head: Normocephalic, without obvious abnormality Throat: lips, mucosa, and tongue normal; teeth and gums normal Neck: no adenopathy and no carotid bruit Back: negative Resp: clear to auscultation bilaterally Cardio: regular rate and rhythm, S1, S2 normal, no murmur, click, rub or gallop GI: soft, non-tender; bowel sounds normal; no masses,  no organomegaly Skin: Skin color, texture, turgor normal. No rashes or lesions Lymph nodes: Cervical, supraclavicular, and axillary nodes normal. Neurologic: Grossly normal  CBC    Component Value Date/Time   WBC 12.0* 08/20/2007 1940   RBC 4.51 08/20/2007 1940   HGB 14.4 08/20/2007 1940   HCT 40.7 08/20/2007 1940   PLT 244 08/20/2007 1940   MCV 90.4 08/20/2007 1940   MCHC 35.3 08/20/2007 1940   RDW 12.6 08/20/2007 1940   LYMPHSABS 0.9 08/20/2007 1940   MONOABS 0.5 08/20/2007 1940   EOSABS 0.0 08/20/2007 1940   BASOSABS 0.1 08/20/2007 1940      Chemistry      Component Value Date/Time   NA 140 12/09/2015 1128   K 4.3 12/09/2015 1128   CL 101 12/09/2015 1128   CO2 28 12/09/2015 1128   BUN 18 12/09/2015 1128   CREATININE 1.10 12/09/2015 1128   CREATININE 1.73* 08/20/2007 1940      Component Value Date/Time   CALCIUM 9.4 12/09/2015 1128   ALKPHOS 91 12/09/2015 1128   AST 12 12/09/2015 1128   ALT 32 12/09/2015 1128   BILITOT 1.1 12/09/2015 1128       Nm Bone Scan Whole Body  02/19/2016  CLINICAL DATA:  Recent diagnosis prostate cancer. Bone pain. Elevated PSA EXAM: NUCLEAR MEDICINE WHOLE BODY BONE SCAN TECHNIQUE: Whole body anterior and posterior images were obtained approximately 3 hours after  intravenous injection of radiopharmaceutical. RADIOPHARMACEUTICALS:  27 mCi Technetium-50m MDP IV COMPARISON:  02/19/2016 and 08/20/2007 FINDINGS: Abnormal activity in the right ninth rib medially over a fairly long segment of the rib. Abnormal activity in the left approximately fourth rib medially, over a relatively still long segment of the rib. A linear band of activity along the left sciatic notch appears correspond to a sclerotic spur on image 109 series 601 of the  prior CT scan and accordingly is probably degenerative rather than metastatic. Transverse high activity at the L3 level, possibly from a compression fracture or malignancy. Degenerative findings at L4-5 and L5-S1, and likely in the mid thoracic spine slightly eccentric to the right. There degenerative findings in the shoulders, and sternoclavicular joints. Mildly accentuated activity in the left mandible. IMPRESSION: 1. Hypermetabolic activity medially in the right approximately ninth rib and left approximately fourth rib, pattern suspicious for malignancy. 2. Transverse high-signal in the L3 vertebral level, possibly from a compression fracture or malignancy. 3. The transverse high signal along the left sciatic notches attributable to sclerotic spurring. Likewise I believe that the activity at the shoulders and sternoclavicular joints is most likely degenerative. 4. Accentuated activity in the left mandible, favor dental disease over malignancy. Electronically Signed   By: Van Clines M.D.   On: 02/19/2016 14:09    Assessment and Plan:   70 year old gentleman with prostate cancer diagnosed in February 2017. His Gleason score 4+5 = 9 and a PSA of 8.13. His staging workup did not show any evidence of visceral metastasis on his pelvic CT although there is suspicious findings in his ribs and L3.  His case was discussed today in the prostate cancer multidisciplinary clinic. His pathology was reviewed with the reviewing pathologist. Imaging  studies were also discussed with radiology. The findings on his ribs and spine could represent benign findings such as Paget's disease or previous trauma. Imaging studies including Lane films of L3 would be a reasonable start potentially MRI can be done if these findings are equivocal.  If the imaging studies did not show evidence of metastatic disease then he would be a candidate for curative options. These options would include primary surgical therapy versus radiation therapy with androgen deprivation. Complications associated with androgen therapy were reviewed as well as the rationale for using it.  There is no role for systemic therapy other than hormone therapy at this time unless he develops widespread bony metastasis.  All his questions were answered today to his satisfaction.

## 2016-03-21 ENCOUNTER — Encounter (HOSPITAL_COMMUNITY): Payer: Self-pay | Admitting: Emergency Medicine

## 2016-03-21 ENCOUNTER — Emergency Department (HOSPITAL_COMMUNITY)
Admission: EM | Admit: 2016-03-21 | Discharge: 2016-03-21 | Disposition: A | Discharge status: Home / Self Care | Payer: Medicare Other | Attending: Emergency Medicine | Admitting: Emergency Medicine

## 2016-03-21 ENCOUNTER — Emergency Department (HOSPITAL_COMMUNITY): Payer: Medicare Other

## 2016-03-21 DIAGNOSIS — Z880 Allergy status to penicillin: Secondary | ICD-10-CM | POA: Insufficient documentation

## 2016-03-21 DIAGNOSIS — W01198A Fall on same level from slipping, tripping and stumbling with subsequent striking against other object, initial encounter: Secondary | ICD-10-CM | POA: Insufficient documentation

## 2016-03-21 DIAGNOSIS — S62645A Nondisplaced fracture of proximal phalanx of left ring finger, initial encounter for closed fracture: Secondary | ICD-10-CM | POA: Diagnosis not present

## 2016-03-21 DIAGNOSIS — Y9289 Other specified places as the place of occurrence of the external cause: Secondary | ICD-10-CM | POA: Insufficient documentation

## 2016-03-21 DIAGNOSIS — S0083XA Contusion of other part of head, initial encounter: Secondary | ICD-10-CM | POA: Diagnosis not present

## 2016-03-21 DIAGNOSIS — Y998 Other external cause status: Secondary | ICD-10-CM | POA: Diagnosis not present

## 2016-03-21 DIAGNOSIS — Z8546 Personal history of malignant neoplasm of prostate: Secondary | ICD-10-CM | POA: Insufficient documentation

## 2016-03-21 DIAGNOSIS — Z87442 Personal history of urinary calculi: Secondary | ICD-10-CM | POA: Insufficient documentation

## 2016-03-21 DIAGNOSIS — Z7982 Long term (current) use of aspirin: Secondary | ICD-10-CM | POA: Diagnosis not present

## 2016-03-21 DIAGNOSIS — Z8709 Personal history of other diseases of the respiratory system: Secondary | ICD-10-CM | POA: Insufficient documentation

## 2016-03-21 DIAGNOSIS — S60222A Contusion of left hand, initial encounter: Secondary | ICD-10-CM | POA: Diagnosis not present

## 2016-03-21 DIAGNOSIS — Y9301 Activity, walking, marching and hiking: Secondary | ICD-10-CM | POA: Insufficient documentation

## 2016-03-21 DIAGNOSIS — S92302A Fracture of unspecified metatarsal bone(s), left foot, initial encounter for closed fracture: Secondary | ICD-10-CM

## 2016-03-21 DIAGNOSIS — S6992XA Unspecified injury of left wrist, hand and finger(s), initial encounter: Secondary | ICD-10-CM | POA: Diagnosis present

## 2016-03-21 DIAGNOSIS — Z79899 Other long term (current) drug therapy: Secondary | ICD-10-CM | POA: Insufficient documentation

## 2016-03-21 MED ORDER — HYDROCODONE-ACETAMINOPHEN 5-325 MG PO TABS: 2.0000 | ORAL_TABLET | ORAL | Status: DC | PRN

## 2016-03-21 NOTE — Discharge Instructions (Signed)
Finger Fracture  Fractures of fingers are breaks in the bones of the fingers. There are many types of fractures. There are different ways of treating these fractures. Your health care provider will discuss the best way to treat your fracture.  CAUSES  Traumatic injury is the main cause of broken fingers. These include:  · Injuries while playing sports.  · Workplace injuries.  · Falls.  RISK FACTORS  Activities that can increase your risk of finger fractures include:  · Sports.  · Workplace activities that involve machinery.  · A condition called osteoporosis, which can make your bones less dense and cause them to fracture more easily.  SIGNS AND SYMPTOMS  The main symptoms of a broken finger are pain and swelling within 15 minutes after the injury. Other symptoms include:  · Bruising of your finger.  · Stiffness of your finger.  · Numbness of your finger.  · Exposed bones (compound fracture) if the fracture is severe.  DIAGNOSIS   The best way to diagnose a broken bone is with X-ray imaging. Additionally, your health care provider will use this X-ray image to evaluate the position of the broken finger bones.   TREATMENT   Finger fractures can be treated with:   · Nonreduction--This means the bones are in place. The finger is splinted without changing the positions of the bone pieces. The splint is usually left on for about a week to 10 days. This will depend on your fracture and what your health care provider thinks.  · Closed reduction--The bones are put back into position without using surgery. The finger is then splinted.  · Open reduction and internal fixation--The fracture site is opened. Then the bone pieces are fixed into place with pins or some type of hardware. This is seldom required. It depends on the severity of the fracture.  HOME CARE INSTRUCTIONS   · Follow your health care provider's instructions regarding activities, exercises, and physical therapy.  · Only take over-the-counter or prescription  medicines for pain, discomfort, or fever as directed by your health care provider.  SEEK MEDICAL CARE IF:  You have pain or swelling that limits the motion or use of your fingers.  SEEK IMMEDIATE MEDICAL CARE IF:   Your finger becomes numb.  MAKE SURE YOU:   · Understand these instructions.  · Will watch your condition.  · Will get help right away if you are not doing well or get worse.     This information is not intended to replace advice given to you by your health care provider. Make sure you discuss any questions you have with your health care provider.     Document Released: 03/06/2001 Document Revised: 09/12/2013 Document Reviewed: 07/04/2013  Elsevier Interactive Patient Education ©2016 Elsevier Inc[employer]

## 2016-03-21 NOTE — ED Provider Notes (Signed)
CSN: [id number]     Arrival date & time 03/21/16  1109 History   First MD Initiated Contact with Patient 03/21/16 1512     Chief Complaint  Patient presents with  . Hand Injury  . Fall     (Consider location/radiation/quality/duration/timing/severity/associated sxs/prior Treatment) Patient is a 70 y.o. male presenting with hand injury and fall. The history is provided by the patient. No language interpreter was used.  Hand Injury Location:  Hand Time since incident:  2 days Injury: no   Hand location:  L hand Pain details:    Quality:  Aching   Radiates to:  Does not radiate   Severity:  Moderate   Onset quality:  Gradual   Duration:  2 days   Timing:  Constant Chronicity:  New Handedness:  Right-handed Dislocation: no   Foreign body present:  No foreign bodies Tetanus status:  Up to date Prior injury to area:  No Relieved by:  Nothing Worsened by:  Nothing tried Ineffective treatments:  None tried Risk factors: no recent illness   Fall  Pt also hit his head. No loc,  Pt reports he feels fine. No headache, no dizziness, no vomitting.  Past Medical History  Diagnosis Date  . Allergy   . Sinus infection   . Bronchitis   . Nephrolithiasis   . Prostate cancer [facility])    Past Surgical History  Procedure Laterality Date  . Wisdom tooth extraction    . Lithotripsy    . Eye surgery      B cataracts removed.  . Eye surgery      lens implants  . Tonsillectomy and adenoidectomy      also received radiation treatments, as was frequent in children at the time  . Prostate biopsy     Family History  Problem Relation Age of Onset  . Heart attack Father   . Heart disease Father 42    AMI as cause of death  . Diabetes Father   . Hyperlipidemia Father   . Parkinson's disease Brother   . Diabetes Brother   . Cancer Brother     melanoma retina  . Heart disease Brother   . Stroke Maternal Grandmother   . Cancer Mother 55    Hodgkins   . Glaucoma Mother   . Colon  cancer Neg Hx   . Colon polyps Neg Hx    Social History  Substance Use Topics  . Smoking status: Never Smoker   . Smokeless tobacco: Never Used  . Alcohol Use: 1.2 oz/week    2 Standard drinks or equivalent per week     Comment: wine - red    Review of Systems  All other systems reviewed and are negative.     Allergies  Penicillins  Home Medications   Prior to Admission medications   Medication Sig Start Date End Date Taking? Authorizing Provider  aspirin 325 MG EC tablet Take 325 mg by mouth daily.   Yes Historical Provider, MD  fenofibrate 160 MG tablet Take 1 tablet (160 mg total) by mouth daily. 12/11/15  Yes Chelle Jeffery, PA-C  KRILL OIL PO Take 750 mg by mouth daily.   Yes Historical Provider, MD  meloxicam (MOBIC) 7.5 MG tablet Take 7.5 mg by mouth once.   Yes Historical Provider, MD  sildenafil (VIAGRA) 100 MG tablet Take 1 tablet (100 mg total) by mouth daily as needed for erectile dysfunction. 12/09/15  Yes Chelle Jeffery, PA-C  ipratropium (ATROVENT) 0.03 % nasal spray Place  2 sprays into both nostrils 2 (two) times daily. Patient not taking: Reported on 12/09/2015 11/27/15   Dorian Heckle English, PA  zoster vaccine live, PF, (ZOSTAVAX) 40981 UNT/0.65ML injection Inject 19,400 Units into the skin once. Patient not taking: Reported on 03/21/2016 12/09/15   Chelle Jeffery, PA-C   BP 178/90 mmHg  Pulse 68  Temp(Src) 97.8 F (36.6 C) (Oral)  Resp 18  SpO2 100% Physical Exam  Constitutional: He is oriented to person, place, and time. He appears well-developed and well-nourished.  HENT:  Head: Normocephalic.  Contusion left forehead  Eyes: Pupils are equal, round, and reactive to light.  Neck: Normal range of motion.  Cardiovascular: Normal rate.   Pulmonary/Chest: Effort normal.  Abdominal: Soft.  Musculoskeletal: He exhibits tenderness.  Swollen, bruised left hand, tender base of 4th finger,  nv and ns intact  Neurological: He is alert and oriented to person,  place, and time. He has normal reflexes.  Skin: Skin is warm.  Psychiatric: He has a normal mood and affect.  Nursing note and vitals reviewed.   ED Course  Procedures (including critical care time) Labs Review Labs Reviewed - No data to display  Imaging Review Dg Hand Complete Left  03/21/2016  CLINICAL DATA:  70 year old male with history of trauma from a fall onto concrete yesterday ago pain in the left hand and, most severe in the proximal left fourth digit. EXAM: LEFT HAND - COMPLETE 3+ VIEW COMPARISON:  No priors. FINDINGS: Mildly comminuted nondisplaced fracture above proximal aspect of the left fourth proximal phalanx which appears to extend to the articular surface. Overlying soft tissue swelling. No other acute displaced fracture, subluxation or dislocation is noted. IMPRESSION: 1. Acute nondisplaced mildly comminuted intra-articular fracture through the base of the left fourth proximal phalanx. Electronically Signed   By: Vinnie Langton M.D.   On: 03/21/2016 13:25   I have personally reviewed and evaluated these images and lab results as part of my medical decision-making.   EKG Interpretation None      MDM   Final diagnoses:  Fracture of 4th metatarsal, left, closed, initial encounter  Contusion of forehead, initial encounter    Meds ordered this encounter  Medications  . KRILL OIL PO    Sig: Take 750 mg by mouth daily.  . meloxicam (MOBIC) 7.5 MG tablet    Sig: Take 7.5 mg by mouth once.  Marland Kitchen HYDROcodone-acetaminophen (NORCO/VICODIN) 5-325 MG tablet    Sig: Take 2 tablets by mouth every 4 (four) hours as needed.    Dispense:  16 tablet    Refill:  0    Order Specific Question:  Supervising Provider    Answer:  Noemi Chapel [[id number]]   An After Visit Summary was printed and given to the patient.   [place], PA-C 03/21/16 Annawan Liu, MD 03/22/16 [phone]

## 2016-03-21 NOTE — ED Notes (Signed)
Pt fell onto L hand and L head onto concrete yesterday while walking. Pt sts he got a little winded and tripped and fell. Pt denies LOC, sts he saw a flash of white when he hit his head. Pt denies N/V, blurry vision, dizziness. Pt's R hand and fingers appear bruised and significantly swollen. Pt sts his L ring finger hurts the most, pt sts "I think it's jammed." Pt also c/o minor L rib cage pain. A&Ox4 and ambulatory.

## 2016-03-22 ENCOUNTER — Telehealth: Payer: Self-pay | Admitting: Medical Oncology

## 2016-03-22 NOTE — Telephone Encounter (Signed)
I returned call to Lance Stevens and left him a message. I explained that Dr. Alinda Money will place the order for the x-rays and then he will receive a call with time and date. I acknowledge his anxiety and will follow up on this matter to insure this appointment gets scheduled quickly. I asked him to call me back if he has further questions or concerns.

## 2016-03-22 NOTE — Progress Notes (Signed)
Oncology Nurse Navigator Documentation  Oncology Nurse Navigator Flowsheets 03/19/2016 03/22/2016 03/22/2016  Navigator Location - - -  Navigator Encounter Type Clinic/MDC Telephone Telephone  Telephone - Incoming Call Outgoing Call  Abnormal Finding Date - - -  Confirmed Diagnosis Date - - -  Patient Visit Type Initial - -  Barriers/Navigation Needs Education - -  Education Occupational psychologist Treatment Options - -  Interventions - Coordination of Care -  Coordination of Care - Appts -  Education Method Teach-back;Written - -  Support Groups/Services Friends and Family - -  Acuity - - -  Acuity Level 2 - - -  Time Spent with Patient > 120 15 15

## 2016-03-22 NOTE — Telephone Encounter (Signed)
Lance Stevens called and left me a message  to inquire about appointment for x-rays of his spine. He stated that he is anxious and would like to get these done ASAP. He asked me to return his call.

## 2016-03-23 ENCOUNTER — Encounter: Payer: Self-pay | Admitting: General Practice

## 2016-03-23 ENCOUNTER — Ambulatory Visit (HOSPITAL_COMMUNITY)
Admission: RE | Admit: 2016-03-23 | Discharge: 2016-03-23 | Disposition: A | Discharge status: Home / Self Care | Payer: Medicare Other | Source: Ambulatory Visit | Attending: Urology | Admitting: Urology

## 2016-03-23 ENCOUNTER — Other Ambulatory Visit (HOSPITAL_COMMUNITY): Payer: Self-pay | Admitting: Urology

## 2016-03-23 ENCOUNTER — Encounter: Payer: Self-pay | Admitting: Medical Oncology

## 2016-03-23 DIAGNOSIS — C61 Malignant neoplasm of prostate: Secondary | ICD-10-CM | POA: Diagnosis present

## 2016-03-23 DIAGNOSIS — M5136 Other intervertebral disc degeneration, lumbar region: Secondary | ICD-10-CM | POA: Insufficient documentation

## 2016-03-23 NOTE — Progress Notes (Signed)
[facility] Psychosocial Distress Screening Spiritual Care  Spiritual Care was referred by distress screening protocol.  The patient scored a 4 on the Psychosocial Distress Thermometer which indicates moderate distress. Lance Stevens was seen by counseling intern Lance Stevens in [facility] on 03/19/16 to assess for distress and other psychosocial needs.   ONCBCN DISTRESS SCREENING 03/23/2016  Screening Type Initial Screening  Distress experienced in past week (1-10) 4  Practical problem type Work/school  Emotional problem type Nervousness/Anxiety;Adjusting to illness  Physical Problem type Changes in urination;Sleep/insomnia  Referral to support programs Yes  Other Spiritual Care, counseling interns   Lance Stevens reports good support, particularly from his minister.   Pt named his anxiety related to starting tx, also verbalizing hopefulness and awareness of coping tools.  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  Lance Stevens provided emotional support and introduction to [employer] team/resources.  Follow up needed: No.  Lance Stevens has packet of print materials about Support Team contact info and programming calendar for additional support.  He is aware of ongoing support team availability as desired, but please also page as needs arise/circumstances change.  Thank you.  [place], [place], [facility] Pager [phone] Voicemail  [phone]

## 2016-03-23 NOTE — Progress Notes (Signed)
Oncology Nurse Navigator Documentation  Oncology Nurse Navigator Flowsheets 03/22/2016 03/22/2016 03/23/2016  Navigator Location - - -  Navigator Encounter Type Telephone Telephone -  Telephone Incoming Call Outgoing Call -  Abnormal Finding Date - - -  Confirmed Diagnosis Date - - -  Patient Visit Type - - -  Barriers/Navigation Needs - - Coordination of Care-contacted Dr. Alinda Money regarding follow up films of lumbar spine. He will place order for x-rays.  Education - - -  Interventions Coordination of Care - Coordination of Care  Coordination of Care Appts - Appts  Education Method - - -  Support Groups/Services - - -  Acuity - - -  Acuity Level 2 - - -  Time Spent with Patient 15 15 15

## 2016-03-24 ENCOUNTER — Other Ambulatory Visit: Payer: Self-pay | Admitting: Urology

## 2016-03-24 ENCOUNTER — Encounter: Payer: Self-pay | Admitting: Medical Oncology

## 2016-03-24 NOTE — Progress Notes (Signed)
Oncology Nurse Navigator Documentation  Oncology Nurse Navigator Flowsheets 03/22/2016 03/23/2016 03/24/2016  Navigator Location - - -  Navigator Encounter Type Telephone - Telephone- I called Mr. Antosh to follow up with him regarding x-ray results and clinic follow up. He states that he got his x-rays done yesterday but he has not heard from Dr. Alinda Money. I informed him that Dr. Alinda Money sent me a message that they were negative and he will be calling patient. Mr. Fewkes informed me that he had a fall in his yard and broke a finger over the week-end but he is doing well. He states that he is ready to get his surgery scheduled as soon as possible. I informed him that Dr. Lynne Logan office will schedule and he can discuss this when he calls him with results. I asked him to call me back with any questions or concerns. He voiced understanding.  Telephone Outgoing Call - Incoming Call;Diagnostic Results  Abnormal Finding Date - - -  Confirmed Diagnosis Date - - -  Patient Visit Type - - -  Barriers/Navigation Needs - Coordination of Care Education  Education - - Preparing for Upcoming Surgery/ Treatment  Interventions - Coordination of Care Education Method  Coordination of Care - Appts -  Education Method - - Verbal  Support Groups/Services - - -  Acuity - - -  Acuity Level 2 - - -  Time Spent with Patient 15 15 15

## 2016-03-25 ENCOUNTER — Telehealth: Payer: Self-pay

## 2016-03-25 NOTE — Patient Instructions (Addendum)
YOUR PROCEDURE IS SCHEDULED ON : 04/05/16  REPORT TO [facility] MAIN ENTRANCE FOLLOW SIGNS TO EAST ELEVATOR - GO TO 3rd FLOOR CHECK IN AT 3 EAST NURSES STATION (SHORT STAY) AT: 5:15 AM  CALL THIS NUMBER IF YOU HAVE PROBLEMS THE MORNING OF SURGERY [phone]  REMEMBER:ONLY 1 PER PERSON MAY GO TO SHORT STAY WITH YOU TO GET READY THE MORNING OF YOUR SURGERY  DO NOT EAT FOOD OR DRINK LIQUIDS AFTER MIDNIGHT  TAKE THESE MEDICINES THE MORNING OF SURGERY: NONE  YOU MAY NOT HAVE ANY METAL ON YOUR BODY INCLUDING HAIR PINS AND PIERCING'S. DO NOT WEAR JEWELRY, MAKEUP, LOTIONS, POWDERS OR PERFUMES. DO NOT WEAR NAIL POLISH. DO NOT SHAVE 48 HRS PRIOR TO SURGERY. MEN MAY SHAVE FACE AND NECK.  DO NOT [place]. [facility] IS NOT RESPONSIBLE FOR VALUABLES.  CONTACTS, DENTURES OR PARTIALS MAY NOT BE WORN TO SURGERY. LEAVE SUITCASE IN CAR. CAN BE BROUGHT TO ROOM AFTER SURGERY.  PATIENTS DISCHARGED THE DAY OF SURGERY WILL NOT BE ALLOWED TO DRIVE HOME.  PLEASE READ OVER THE FOLLOWING INSTRUCTION SHEETS _________________________________________________________________________________                                           - PREPARING FOR SURGERY  Before surgery, you can play an important role.  Because skin is not sterile, your skin needs to be as free of germs as possible.  You can reduce the number of germs on your skin by washing with CHG (chlorahexidine gluconate) soap before surgery.  CHG is an antiseptic cleaner which kills germs and bonds with the skin to continue killing germs even after washing. Please DO NOT use if you have an allergy to CHG or antibacterial soaps.  If your skin becomes reddened/irritated stop using the CHG and inform your nurse when you arrive at Short Stay. Do not shave (including legs and underarms) for at least 48 hours prior to the first CHG shower.  You may shave your face. Please follow these instructions  carefully:   1.  Shower with CHG Soap the night before surgery and the  morning of Surgery.   2.  If you choose to wash your hair, wash your hair first as usual with your  normal  Shampoo.   3.  After you shampoo, rinse your hair and body thoroughly to remove the  shampoo.                                         4.  Use CHG as you would any other liquid soap.  You can apply chg directly  to the skin and wash . Gently wash with scrungie or clean wascloth    5.  Apply the CHG Soap to your body ONLY FROM THE NECK DOWN.   Do not use on open                           Wound or open sores. Avoid contact with eyes, ears mouth and genitals (private parts).                        Genitals (private parts) with your normal soap.  6.  Wash thoroughly, paying special attention to the area where your surgery  will be performed.   7.  Thoroughly rinse your body with warm water from the neck down.   8.  DO NOT shower/wash with your normal soap after using and rinsing off  the CHG Soap .                9.  Pat yourself dry with a clean towel.             10.  Wear clean night clothes to bed after shower             11.  Place clean sheets on your bed the night of your first shower and do not  sleep with pets.  Day of Surgery : Do not apply any lotions/deodorants the morning of surgery.  Please wear clean clothes to the hospital/surgery center.  FAILURE TO FOLLOW THESE INSTRUCTIONS MAY RESULT IN THE CANCELLATION OF YOUR SURGERY    PATIENT SIGNATURE_________________________________  ______________________________________________________________________     Adam Phenix  An incentive spirometer is a tool that can help keep your lungs clear and active. This tool measures how well you are filling your lungs with each breath. Taking long deep breaths may help reverse or decrease the chance of developing breathing (pulmonary) problems (especially infection) following:  A long  period of time when you are unable to move or be active. BEFORE THE PROCEDURE   If the spirometer includes an indicator to show your best effort, your nurse or respiratory therapist will set it to a desired goal.  If possible, sit up straight or lean slightly forward. Try not to slouch.  Hold the incentive spirometer in an upright position. INSTRUCTIONS FOR USE   Sit on the edge of your bed if possible, or sit up as far as you can in bed or on a chair.  Hold the incentive spirometer in an upright position.  Breathe out normally.  Place the mouthpiece in your mouth and seal your lips tightly around it.  Breathe in slowly and as deeply as possible, raising the piston or the ball toward the top of the column.  Hold your breath for 3-5 seconds or for as long as possible. Allow the piston or ball to fall to the bottom of the column.  Remove the mouthpiece from your mouth and breathe out normally.  Rest for a few seconds and repeat Steps 1 through 7 at least 10 times every 1-2 hours when you are awake. Take your time and take a few normal breaths between deep breaths.  The spirometer may include an indicator to show your best effort. Use the indicator as a goal to work toward during each repetition.  After each set of 10 deep breaths, practice coughing to be sure your lungs are clear. If you have an incision (the cut made at the time of surgery), support your incision when coughing by placing a pillow or rolled up towels firmly against it. Once you are able to get out of bed, walk around indoors and cough well. You may stop using the incentive spirometer when instructed by your caregiver.  RISKS AND COMPLICATIONS  Take your time so you do not get dizzy or light-headed.  If you are in pain, you may need to take or ask for pain medication before doing incentive spirometry. It is harder to take a deep breath if you are having pain. AFTER USE  Rest and breathe slowly and easily.  It can  be helpful to keep track of a log of your progress. Your caregiver can provide you with a simple table to help with this. If you are using the spirometer at home, follow these instructions: [place] IF:   You are having difficultly using the spirometer.  You have trouble using the spirometer as often as instructed.  Your pain medication is not giving enough relief while using the spirometer.  You develop fever of 100.5 F (38.1 C) or higher. SEEK IMMEDIATE MEDICAL CARE IF:   You cough up bloody sputum that had not been present before.  You develop fever of 102 F (38.9 C) or greater.  You develop worsening pain at or near the incision site. MAKE SURE YOU:   Understand these instructions.  Will watch your condition.  Will get help right away if you are not doing well or get worse. Document Released: 04/04/2007 Document Revised: 02/14/2012 Document Reviewed: 06/05/2007 Exit[employer] Patient Information 2014 Exit[employer], [place].   ________________________________________________________________________  WHAT IS A BLOOD TRANSFUSION? Blood Transfusion Information  A transfusion is the replacement of blood or some of its parts. Blood is made up of multiple cells which provide different functions.  Red blood cells carry oxygen and are used for blood loss replacement.  White blood cells fight against infection.  Platelets control bleeding.  Plasma helps clot blood.  Other blood products are available for specialized needs, such as hemophilia or other clotting disorders. BEFORE THE TRANSFUSION  Who gives blood for transfusions?   Healthy volunteers who are fully evaluated to make sure their blood is safe. This is blood bank blood. Transfusion therapy is the safest it has ever been in the practice of medicine. Before blood is taken from a donor, a complete history is taken to make sure that person has no history of diseases nor engages in risky social behavior (examples are  intravenous drug use or sexual activity with multiple partners). The donor's travel history is screened to minimize risk of transmitting infections, such as malaria. The donated blood is tested for signs of infectious diseases, such as HIV and hepatitis. The blood is then tested to be sure it is compatible with you in order to minimize the chance of a transfusion reaction. If you or a relative donates blood, this is often done in anticipation of surgery and is not appropriate for emergency situations. It takes many days to process the donated blood. RISKS AND COMPLICATIONS Although transfusion therapy is very safe and saves many lives, the main dangers of transfusion include:   Getting an infectious disease.  Developing a transfusion reaction. This is an allergic reaction to something in the blood you were given. Every precaution is taken to prevent this. The decision to have a blood transfusion has been considered carefully by your caregiver before blood is given. Blood is not given unless the benefits outweigh the risks. AFTER THE TRANSFUSION  Right after receiving a blood transfusion, you will usually feel much better and more energetic. This is especially true if your red blood cells have gotten low (anemic). The transfusion raises the level of the red blood cells which carry oxygen, and this usually causes an energy increase.  The nurse administering the transfusion will monitor you carefully for complications. HOME CARE INSTRUCTIONS  No special instructions are needed after a transfusion. You may find your energy is better. Speak with your caregiver about any limitations on activity for underlying diseases you may have. SEEK MEDICAL CARE IF:   Your  condition is not improving after your transfusion.  You develop redness or irritation at the intravenous (IV) site. SEEK IMMEDIATE MEDICAL CARE IF:  Any of the following symptoms occur over the next 12 hours:  Shaking chills.  You have a  temperature by mouth above 102 F (38.9 C), not controlled by medicine.  Chest, back, or muscle pain.  People around you feel you are not acting correctly or are confused.  Shortness of breath or difficulty breathing.  Dizziness and fainting.  You get a rash or develop hives.  You have a decrease in urine output.  Your urine turns a dark color or changes to pink, red, or brown. Any of the following symptoms occur over the next 10 days:  You have a temperature by mouth above 102 F (38.9 C), not controlled by medicine.  Shortness of breath.  Weakness after normal activity.  The white part of the eye turns yellow (jaundice).  You have a decrease in the amount of urine or are urinating less often.  Your urine turns a dark color or changes to pink, red, or brown. Document Released: 11/19/2000 Document Revised: 02/14/2012 Document Reviewed: 07/08/2008 [facility] Patient Information 2014 [place].

## 2016-03-25 NOTE — Telephone Encounter (Signed)
Prostate Cancer and he will have surgery on May 1st at [facility]. He wants to speak with Chelle personally about this and his future appointments with her.  Please advise  [phone]

## 2016-03-26 ENCOUNTER — Encounter (HOSPITAL_COMMUNITY)
Admission: RE | Admit: 2016-03-26 | Discharge: 2016-03-26 | Disposition: A | Discharge status: Home / Self Care | Payer: Medicare Other | Source: Ambulatory Visit | Attending: Urology | Admitting: Urology

## 2016-03-26 ENCOUNTER — Encounter (HOSPITAL_COMMUNITY): Payer: Self-pay

## 2016-03-26 DIAGNOSIS — C61 Malignant neoplasm of prostate: Secondary | ICD-10-CM | POA: Insufficient documentation

## 2016-03-26 HISTORY — DX: Prediabetes: R73.03

## 2016-03-26 HISTORY — DX: Personal history of other malignant neoplasm of skin: Z85.828

## 2016-03-26 HISTORY — DX: Personal history of other diseases of the musculoskeletal system and connective tissue: Z87.39

## 2016-03-26 HISTORY — DX: Fracture of unspecified phalanx of unspecified finger, initial encounter for closed fracture: S62.609A

## 2016-03-26 HISTORY — DX: Personal history of urinary calculi: Z87.442

## 2016-03-26 LAB — BASIC METABOLIC PANEL
Anion gap: 7 (ref 5–15)
BUN: 20 mg/dL (ref 6–20)
CHLORIDE: 106 mmol/L (ref 101–111)
CO2: 30 mmol/L (ref 22–32)
CREATININE: 1.31 mg/dL — AB (ref 0.61–1.24)
Calcium: 10.2 mg/dL (ref 8.9–10.3)
GFR calc Af Amer: >60 mL/min (ref >60)
GFR, EST NON AFRICAN AMERICAN: 54 mL/min — AB (ref >60)
GLUCOSE: 160 mg/dL — AB (ref 65–99)
POTASSIUM: 5.3 mmol/L — AB (ref 3.5–5.1)
SODIUM: 143 mmol/L (ref 135–145)

## 2016-03-26 LAB — CBC
HEMATOCRIT: 42.1 % (ref 39.0–52.0)
Hemoglobin: 14.3 g/dL (ref 13.0–17.0)
MCH: 31.4 pg (ref 26.0–34.0)
MCHC: 34 g/dL (ref 30.0–36.0)
MCV: 92.5 fL (ref 78.0–100.0)
PLATELETS: 230 10*3/uL (ref 150–400)
RBC: 4.55 MIL/uL (ref 4.22–5.81)
RDW: 12.9 % (ref 11.5–15.5)
WBC: 6.2 10*3/uL (ref 4.0–10.5)

## 2016-03-26 LAB — ABO/RH: ABO/RH(D): O POS

## 2016-03-26 NOTE — Telephone Encounter (Signed)
I spoke to operators who advised that the Scheduling Pool is still being monitored by [facility] and that I should send this there so that she can schedule appt when the template is ready for June/July.

## 2016-03-26 NOTE — Telephone Encounter (Signed)
Spoke with patient regarding upcoming surgery for aggressive prostate cancer on 5/01.  He needs to reschedule his follow-up with me on 5/02 for later in the summer.  Please call his to reschedule that visit for diabetes follow-up (June/July).

## 2016-04-02 NOTE — H&P (Signed)
Chief Complaint  Prostate Cancer   Reason For Visit  Reason for consult: To discuss treatment options for high risk prostate cancer. Physician requesting consult: Dr. 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  He has a past medical history of diabetes, gout, hyperlipidemia, hypertension, and kidney stones.  He was noted to have an elevated screening PSA of 11.7 by his PCP prompting a referral to Dr. Eskridge.  His repeat PSA remained elevated at 8.13 and he was noted to have a nodule at the left apex of the prostate on exam.  He underwent a TRUS biopsy of the prostate on 01/30/16 that demonstrated a hypoechoic area at the left apex and his pathology confirmed Gleason 4+5=9 adenocarcinoma of the prostate with 6 out of 12 biopsy cores positive for malignancy with his high grade disease noted at the left apex as well.  He has no family history of prostate cancer. He underwent staging studies on 02/19/16 including a CT scan of the pelvis that was negative for lymphadenopathy or other evidence of metastatic disease and a bone scan that demonstrated uptake on the 4th and 9th ribs.  A CT of the chest determined these areas to demonstrate cortical thickening thought to most likely be benign consistent with trauma vs. Paget's disease.  His bone scan also demonstrated uptake at the L3 vertebral body although this has not been further evaluated.  He has a history of calcium oxalate stones s/p ESWL in September 2008.  TNM stage: cT2a N0 M0 (left apex) PSA: 8.13 Gleason score: 4+5=9 Biopsy (01/30/16): 6/12 cores positive    Left: L lateral apex (70%, 4+5=9), L apex (80%, 4+5=9), L lateral mid (60%, 3+3=6), L mid (5%, 3+3=6)    Right: R base (10%, 3+3=6), R lateral base (10%,  3+3=6) Prostate volume: 38 cc  Nomogram OC disease: 18% EPE: 79% SVI: 30% LNI: 27% PFS (surgery): 35% at 6 years, 22% at 11 years  Urinary function: He does have mild-to-moderate lower urinary tract symptoms.  IPSS is 9.  He has not required medical treatment. Erectile function: He does have mild-to-moderate erectile dysfunction.  SHIM score is 15.  His previously utilize Viagra although has not used this in many years.  He estimates that his ability to get an erection adequate for intercourse allows him to achieve an adequate outcome approximately 7 out of 10 times.   Past Medical History  1. History of Gout (M10.9)  2. History of diabetes mellitus (Z86.39)  3. History of hypercholesterolemia (Z86.39)  4. History of hypertension (Z86.79)  5. History of kidney stones (Z87.442)  Surgical History  1. History of Lithotripsy  2. History of Tonsillectomy  Current Meds  1. Aspirin 325 MG Oral Tablet;  Therapy: (Recorded:15Feb2017) to Recorded  2. Fenofibrate 160 MG Oral Tablet;  Therapy: (Recorded:15Feb2017) to Recorded  3. MegaRed Omega-3 Krill Oil CAPS;  Therapy: (Recorded:15Feb2017) to Recorded  4. Senior Mens Formula CAPS;  Therapy: (Recorded:15Feb2017) to Recorded  Allergies  1. Penicillins  Family History  1. Family history of Death In The Family Father  2. Family history of Death In The Family Mother  3. Family history of Family Health Status Number Of Children  4. Family history of Hodgkin's lymphoma (Z80.7) : Mother  5. Family history of   myocardial infarction (Z82.49) : Father  6. Family history of Parkinson's disease (Z82.0) : Brother  Social History   Marital History - Currently Married   Retired   Three children   Denied: History of Tobacco Use  Review of Systems AU Complete-Male: Genitourinary, constitutional, skin, eye, otolaryngeal, hematologic/lymphatic, cardiovascular, pulmonary, endocrine, musculoskeletal, gastrointestinal, neurological and  psychiatric system(s) were reviewed and pertinent findings if present are noted and are otherwise negative.    Physical Exam Constitutional: Well nourished and well developed . No acute distress.  ENT:. The ears and nose are normal in appearance.  Neck: The appearance of the neck is normal and no neck mass is present.  Pulmonary: No respiratory distress, normal respiratory rhythm and effort and clear bilateral breath sounds.  Cardiovascular: Heart rate and rhythm are normal . No peripheral edema.  Abdomen: The abdomen is mildly obese. The abdomen is soft and nontender. No masses are palpated. No CVA tenderness. No hernias are palpable. No hepatosplenomegaly noted.  Rectal: Rectal exam demonstrates normal sphincter tone, no tenderness and no masses. He does have a firm 1.5; nodule toward the left apex of the prostate potentially extending toward the extraprostatic tissues. The prostate is not tender. The left seminal vesicle is nonpalpable. The right seminal vesicle is nonpalpable. The perineum is normal on inspection.  Lymphatics: The femoral and inguinal nodes are not enlarged or tender.  Skin: Normal skin turgor, no visible rash and no visible skin lesions.  Neuro/Psych:. Mood and affect are appropriate.    Results/Data Selected Results  CT-CHEST W/O CONTRAST 22Mar2017 12:00AM Eskridge, Matthew   Test Name Result Flag Reference  CT-CHEST W/O CONTRAST (Report)    ** RADIOLOGY REPORT BY [facility] **   CLINICAL DATA: Followup rib abnormality seen on recent bone scan.  EXAM: CT CHEST WITHOUT CONTRAST  TECHNIQUE: Multidetector CT imaging of the chest was performed following the standard protocol without IV contrast.  COMPARISON: Whole bone scan 02/19/2016  FINDINGS: Mediastinum/Nodes: No chest wall mass, supraclavicular or axillary lymphadenopathy.  The heart is normal in size. No pericardial effusion. 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 Scattered aortic calcifications.  Three-vessel coronary artery calcifications.  No mediastinal or hilar mass or adenopathy. Esophagus is grossly normal.  Lungs/Pleura: No acute pulmonary findings. No worrisome pulmonary lesions. No pleural effusion.  Upper abdomen: No significant upper abdominal findings.  Musculoskeletal: The abnormalities the on the bone scan correlate 2 areas of marked cortical thickening involving the fourth right posterior rib and also the ninth left posterior rib. This is not have the appearance of prostate cancer. This is most likely some type of hyperostotic process and could be related to remote trauma. Paget's disease would be another possibility. There are also diffuse degenerative changes involving the thoracic spine with osteophytosis and DISH.  Severe degenerate disc disease is barely covered at L2-3 but may account for the abnormality on the bone scan.  IMPRESSION: 1. The findings on the bone scan are un likely due to prostate cancer. This is more likely remote trauma or other hyperostotic process such as Paget's disease. 2. 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 3. No findings suspicious for metastatic prostate cancer. 4. Three vessel coronary artery calcifications.   Electronically Signed  By: P. Gallerani M.D.  On: 02/25/2016 17:27   BUN & CREATININE 16Mar2017 09:42AM Eskridge, Matthew  SPECIMEN TYPE: BLOOD   Test Name Result Flag Reference  CREATININE 1.20 mg/dL  0.50-1.50  BUN 23   mg/dL  6-30  Est GFR, African American 71 mL/min  >=60  PERFORMED AT:        [facility]                      [address]                      [place], [place] [id number]  Est GFR, NonAfrican American 61 mL/min  >=60  THE ESTIMATED GFR IS A CALCULATION VALID FOR ADULTS (>=18 YEARS OLD) THAT USES THE CKD-EPI ALGORITHM TO ADJUST FOR AGE AND SEX. IT IS   NOT TO BE USED FOR CHILDREN, PREGNANT  WOMEN, HOSPITALIZED PATIENTS,    PATIENTS ON DIALYSIS, OR WITH RAPIDLY CHANGING KIDNEY FUNCTION. ACCORDING TO THE NKDEP, EGFR >89 IS NORMAL, 60-89 SHOWS MILD IMPAIRMENT, 30-59 SHOWS MODERATE IMPAIRMENT, 15-29 SHOWS SEVERE IMPAIRMENT AND <15 IS ESRD.   CT-PELVIS WITH CONTRAST 16Mar2017 12:00AM Eskridge, Matthew   Test Name Result Flag Reference  CT-PELVIS WITH CONTRAST (Report)    ** RADIOLOGY REPORT BY [facility] **   CLINICAL DATA: Newly diagnosed prostate cancer, elevated PSA  EXAM: CT PELVIS WITH CONTRAST  TECHNIQUE: Multidetector CT imaging of the pelvis was performed using the standard protocol following the bolus administration of intravenous contrast.  CONTRAST: 100 mL Isovue 300 IV  COMPARISON: CT abdomen pelvis dated 08/20/2007  FINDINGS: Urinary Tract: Bladder is within normal limits.  Bowel: Visualized bowel is unremarkable.  Normal appendix (series 2/ image 21).  Vascular/Lymphatic: Mild atherosclerotic calcifications of the infrarenal abdominal aorta.  No suspicious abdominopelvic lymphadenopathy. Small bilateral external iliac/inguinal nodes with preservation of the normal fatty hilum.  Reproductive: Prostate is grossly unremarkable, noting mildly heterogeneous enhancement along the left posterolateral apex of the peripheral gland  Other: No pelvic ascites.  Fat within the bilateral inguinal canals.  Musculoskeletal: Degenerative changes of the lower lumbar spine.  No focal osseous lesions.  IMPRESSION: Prostate is grossly unremarkable in this patient with known prostate cancer.  No evidence of metastatic disease in the pelvis.  Correlate with pending bone scan.   Electronically Signed  By: Sriyesh Krishnan M.D.  On: 02/19/2016 12:23     I have independently reviewed his medical records, PSA results, pathology slides.  I also independently reviewed his imaging studies including a CT scan of the pelvis, bone scan, and CT  scan of chest.  Findings are as dictated above.  Assessment  1. Prostate cancer (C61)  Discussion/Summary  1.  High-risk prostate cancer: I had a detailed discussion with Lance Stevens and his wife today regarding his options for treatment.  We did discuss the need to proceed with additional evaluation of his L3 abnormality on his bone scan.  Assuming that he does not have metastatic disease, it was strongly recommended that he proceed with therapy of curative intent with either primary surgery or androgen deprivation and external beam radiation therapy.  He understands the possibility that he likely will require multimodality treatment.   The patient was counseled about the natural history of prostate cancer and the standard treatment options that are available for prostate cancer. It was explained to him how his age and life expectancy, clinical stage, Gleason score, and PSA affect his prognosis, the decision to proceed with additional staging studies, as well as how that information influences recommended treatment strategies. We discussed the roles for active surveillance, radiation therapy, surgical therapy, androgen deprivation, as well as ablative therapy options for the treatment   of prostate cancer as appropriate to his individual cancer situation. We discussed the risks and benefits of these options with regard to their impact on cancer control and also in terms of potential adverse events, complications, and impact on quiality of life particularly related to urinary, bowel, and sexual function. The patient was encouraged to ask questions throughout the discussion today and all questions were answered to his stated satisfaction. In addition, the patient was provided with and/or directed to appropriate resources and literature for further education about prostate cancer and treatment options.   We discussed surgical therapy for prostate cancer including the different available surgical approaches. We  discussed, in detail, the risks and expectations of surgery with regard to cancer control, urinary control, and erectile function as well as the expected postoperative recovery process. Additional risks of surgery including but not limited to bleeding, infection, hernia formation, nerve damage, lymphocele formation, bowel/rectal injury potentially necessitating colostomy, damage to the urinary tract resulting in urine leakage, urethral stricture, and the cardiopulmonary risks such as myocardial infarction, stroke, death, venothromboembolism, etc. were explained. The risk of open surgical conversion for robotic/laparoscopic prostatectomy was also discussed.   After discussion, we have agreed to proceed with plain films of his lumbar spine for further evaluation of the increased uptake on his bone scan.  He understands that he may report further evaluation with an MRI if his findings on plain films are equivocal.  If he does not have evidence of measurable metastatic disease and is a candidate for curative therapy, he would like to proceed with surgical treatment.  My tentative plan will be to perform a unilateral right nerve sparing robot-assisted laparoscopic radical prostatectomy and pelvic lymphadenectomy.  This will tentatively be scheduled pending completion of his metastatic evaluation.  A total of 65 minutes were spent in the overall care of the patient today with 55 minutes in direct face to face consultation.   Cc: Dr. Catalina Antigua Eskridge Daphane Shepherd, PA-C Dr. Tyler Pita Dr. Zola Button    Signatures Electronically signed by : Raynelle Bring, M.D.; Mar 19 2016  1:07PM EST

## 2016-04-04 MED ORDER — VANCOMYCIN HCL IN DEXTROSE 1-5 GM/200ML-% IV SOLN
1000.0000 mg | INTRAVENOUS | Status: AC
  Administered 2016-04-05: 1000 mg via INTRAVENOUS
  Filled 2016-04-04 (×2): qty 200

## 2016-04-04 NOTE — Anesthesia Preprocedure Evaluation (Addendum)
Anesthesia Evaluation  Patient identified by MRN, date of birth, ID band Patient awake    Reviewed: Allergy & Precautions, H&P , NPO status , Patient's Chart, lab work & pertinent test results  Airway Mallampati: II  TM Distance: >3 FB Neck ROM: full    Dental no notable dental hx. (+) Dental Advisory Given, Teeth Intact   Pulmonary neg pulmonary ROS,    Pulmonary exam normal breath sounds clear to auscultation       Cardiovascular Exercise Tolerance: Good negative cardio ROS Normal cardiovascular exam Rhythm:regular Rate:Normal  RBBB   Neuro/Psych Neck pain negative neurological ROS  negative psych ROS   GI/Hepatic negative GI ROS, Neg liver ROS,   Endo/Other  negative endocrine ROSdiabetes, Well Controlled, Type 2Diet controlled DM  Renal/GU negative Renal ROS  negative genitourinary   Musculoskeletal   Abdominal   Peds  Hematology negative hematology ROS (+)   Anesthesia Other Findings   Reproductive/Obstetrics negative OB ROS                            Anesthesia Physical Anesthesia Plan  ASA: II  Anesthesia Plan: General   Post-op Pain Management:    Induction: Intravenous  Airway Management Planned: Oral ETT  Additional Equipment:   Intra-op Plan:   Post-operative Plan: Extubation in OR  Informed Consent: I have reviewed the patients History and Physical, chart, labs and discussed the procedure including the risks, benefits and alternatives for the proposed anesthesia with the patient or authorized representative who has indicated his/her understanding and acceptance.   Dental Advisory Given  Plan Discussed with: CRNA and Surgeon  Anesthesia Plan Comments:         Anesthesia Quick Evaluation

## 2016-04-05 ENCOUNTER — Inpatient Hospital Stay (HOSPITAL_COMMUNITY)
Admission: RE | Admit: 2016-04-05 | Discharge: 2016-04-06 | DRG: 708 | Disposition: A | Discharge status: Home / Self Care | Payer: Medicare Other | Source: Ambulatory Visit | Attending: Urology | Admitting: Urology

## 2016-04-05 ENCOUNTER — Inpatient Hospital Stay (HOSPITAL_COMMUNITY): Payer: Medicare Other | Admitting: Anesthesiology

## 2016-04-05 ENCOUNTER — Encounter (HOSPITAL_COMMUNITY)
Admission: RE | Disposition: A | Discharge status: Home / Self Care | Payer: Self-pay | Source: Ambulatory Visit | Attending: Urology

## 2016-04-05 ENCOUNTER — Encounter (HOSPITAL_COMMUNITY): Payer: Self-pay | Admitting: *Deleted

## 2016-04-05 ENCOUNTER — Encounter: Payer: Self-pay | Admitting: Medical Oncology

## 2016-04-05 DIAGNOSIS — N529 Male erectile dysfunction, unspecified: Secondary | ICD-10-CM | POA: Diagnosis present

## 2016-04-05 DIAGNOSIS — C61 Malignant neoplasm of prostate: Secondary | ICD-10-CM | POA: Diagnosis present

## 2016-04-05 DIAGNOSIS — E7800 Pure hypercholesterolemia, unspecified: Secondary | ICD-10-CM | POA: Diagnosis present

## 2016-04-05 DIAGNOSIS — I10 Essential (primary) hypertension: Secondary | ICD-10-CM | POA: Diagnosis present

## 2016-04-05 DIAGNOSIS — Z79899 Other long term (current) drug therapy: Secondary | ICD-10-CM | POA: Diagnosis not present

## 2016-04-05 DIAGNOSIS — Z8249 Family history of ischemic heart disease and other diseases of the circulatory system: Secondary | ICD-10-CM | POA: Diagnosis not present

## 2016-04-05 DIAGNOSIS — E119 Type 2 diabetes mellitus without complications: Secondary | ICD-10-CM | POA: Diagnosis present

## 2016-04-05 DIAGNOSIS — E785 Hyperlipidemia, unspecified: Secondary | ICD-10-CM | POA: Diagnosis present

## 2016-04-05 DIAGNOSIS — Z87442 Personal history of urinary calculi: Secondary | ICD-10-CM | POA: Diagnosis not present

## 2016-04-05 DIAGNOSIS — Z7982 Long term (current) use of aspirin: Secondary | ICD-10-CM | POA: Diagnosis not present

## 2016-04-05 HISTORY — PX: ROBOT ASSISTED LAPAROSCOPIC RADICAL PROSTATECTOMY: SHX5141

## 2016-04-05 HISTORY — PX: LYMPHADENECTOMY: SHX5960

## 2016-04-05 LAB — TYPE AND SCREEN
ABO/RH(D): O POS
Antibody Screen: NEGATIVE

## 2016-04-05 LAB — HEMOGLOBIN AND HEMATOCRIT, BLOOD
HEMATOCRIT: 39.5 % (ref 39.0–52.0)
HEMOGLOBIN: 13.7 g/dL (ref 13.0–17.0)

## 2016-04-05 SURGERY — XI ROBOTIC ASSISTED LAPAROSCOPIC RADICAL PROSTATECTOMY LEVEL 2
Anesthesia: General

## 2016-04-05 MED ORDER — FLEET ENEMA 7-19 GM/118ML RE ENEM: 1.0000 | ENEMA | Freq: Once | RECTAL | Status: DC

## 2016-04-05 MED ORDER — ZOLPIDEM TARTRATE 5 MG PO TABS
5.0000 mg | ORAL_TABLET | Freq: Every evening | ORAL | Status: DC | PRN
  Administered 2016-04-05: 5 mg via ORAL
  Filled 2016-04-05: qty 1

## 2016-04-05 MED ORDER — LACTATED RINGERS IV SOLN
INTRAVENOUS | Status: DC | PRN
  Administered 2016-04-05 (×2): via INTRAVENOUS

## 2016-04-05 MED ORDER — STERILE WATER FOR IRRIGATION IR SOLN
Status: DC | PRN
  Administered 2016-04-05: 1000 mL

## 2016-04-05 MED ORDER — KCL IN DEXTROSE-NACL 20-5-0.45 MEQ/L-%-% IV SOLN
INTRAVENOUS | Status: DC
  Administered 2016-04-05 – 2016-04-06 (×3): via INTRAVENOUS
  Filled 2016-04-05 (×5): qty 1000

## 2016-04-05 MED ORDER — SODIUM CHLORIDE 0.9 % IR SOLN
Status: DC | PRN
  Administered 2016-04-05: 1000 mL via INTRAVESICAL

## 2016-04-05 MED ORDER — LIDOCAINE HCL (CARDIAC) 20 MG/ML IV SOLN
INTRAVENOUS | Status: DC | PRN
  Administered 2016-04-05: 50 mg via INTRAVENOUS

## 2016-04-05 MED ORDER — ROCURONIUM BROMIDE 100 MG/10ML IV SOLN
INTRAVENOUS | Status: DC | PRN
  Administered 2016-04-05 (×2): 10 mg via INTRAVENOUS
  Administered 2016-04-05: 20 mg via INTRAVENOUS
  Administered 2016-04-05 (×2): 10 mg via INTRAVENOUS
  Administered 2016-04-05: 50 mg via INTRAVENOUS

## 2016-04-05 MED ORDER — FENTANYL CITRATE (PF) 250 MCG/5ML IJ SOLN
INTRAMUSCULAR | Status: AC
  Filled 2016-04-05: qty 5

## 2016-04-05 MED ORDER — ROCURONIUM BROMIDE 50 MG/5ML IV SOLN
INTRAVENOUS | Status: AC
  Filled 2016-04-05: qty 1

## 2016-04-05 MED ORDER — SODIUM CHLORIDE 0.9 % IV BOLUS (SEPSIS)
1000.0000 mL | Freq: Once | INTRAVENOUS | Status: AC
  Administered 2016-04-05: 1000 mL via INTRAVENOUS

## 2016-04-05 MED ORDER — SULFAMETHOXAZOLE-TRIMETHOPRIM 800-160 MG PO TABS: 1.0000 | ORAL_TABLET | Freq: Two times a day (BID) | ORAL | Status: DC

## 2016-04-05 MED ORDER — MIDAZOLAM HCL 5 MG/5ML IJ SOLN
INTRAMUSCULAR | Status: DC | PRN
  Administered 2016-04-05: 2 mg via INTRAVENOUS

## 2016-04-05 MED ORDER — METOCLOPRAMIDE HCL 5 MG/ML IJ SOLN
INTRAMUSCULAR | Status: AC
  Filled 2016-04-05: qty 2

## 2016-04-05 MED ORDER — LIDOCAINE HCL (CARDIAC) 20 MG/ML IV SOLN
INTRAVENOUS | Status: AC
  Filled 2016-04-05: qty 5

## 2016-04-05 MED ORDER — LABETALOL HCL 5 MG/ML IV SOLN
INTRAVENOUS | Status: AC
  Filled 2016-04-05: qty 4

## 2016-04-05 MED ORDER — MAGNESIUM CITRATE PO SOLN
1.0000 | Freq: Once | ORAL | Status: DC
  Filled 2016-04-05: qty 296

## 2016-04-05 MED ORDER — METOCLOPRAMIDE HCL 5 MG/ML IJ SOLN
INTRAMUSCULAR | Status: DC | PRN
  Administered 2016-04-05: 10 mg via INTRAVENOUS

## 2016-04-05 MED ORDER — MIDAZOLAM HCL 2 MG/2ML IJ SOLN
INTRAMUSCULAR | Status: AC
  Filled 2016-04-05: qty 2

## 2016-04-05 MED ORDER — MORPHINE SULFATE (PF) 2 MG/ML IV SOLN: 2.0000 mg | INTRAVENOUS | Status: DC | PRN

## 2016-04-05 MED ORDER — FENTANYL CITRATE (PF) 100 MCG/2ML IJ SOLN
INTRAMUSCULAR | Status: DC | PRN
  Administered 2016-04-05 (×5): 50 ug via INTRAVENOUS

## 2016-04-05 MED ORDER — ONDANSETRON HCL 4 MG/2ML IJ SOLN
INTRAMUSCULAR | Status: AC
  Filled 2016-04-05: qty 2

## 2016-04-05 MED ORDER — KETOROLAC TROMETHAMINE 15 MG/ML IJ SOLN
15.0000 mg | Freq: Four times a day (QID) | INTRAMUSCULAR | Status: DC
  Administered 2016-04-05 – 2016-04-06 (×3): 15 mg via INTRAVENOUS
  Filled 2016-04-05 (×3): qty 1

## 2016-04-05 MED ORDER — ROCURONIUM BROMIDE 50 MG/5ML IV SOLN
INTRAVENOUS | Status: AC
  Filled 2016-04-05: qty 2

## 2016-04-05 MED ORDER — HYDROCODONE-ACETAMINOPHEN 5-325 MG PO TABS: 1.0000 | ORAL_TABLET | Freq: Four times a day (QID) | ORAL | Status: DC | PRN

## 2016-04-05 MED ORDER — HYDROMORPHONE HCL 1 MG/ML IJ SOLN
INTRAMUSCULAR | Status: DC | PRN
  Administered 2016-04-05 (×2): 0.5 mg via INTRAVENOUS
  Administered 2016-04-05: 1 mg via INTRAVENOUS

## 2016-04-05 MED ORDER — HYDRALAZINE HCL 20 MG/ML IJ SOLN
INTRAMUSCULAR | Status: AC
  Filled 2016-04-05: qty 1

## 2016-04-05 MED ORDER — LACTATED RINGERS IV SOLN: INTRAVENOUS | Status: DC

## 2016-04-05 MED ORDER — HYDROMORPHONE HCL 2 MG/ML IJ SOLN
INTRAMUSCULAR | Status: AC
  Filled 2016-04-05: qty 1

## 2016-04-05 MED ORDER — SUGAMMADEX SODIUM 200 MG/2ML IV SOLN
INTRAVENOUS | Status: DC | PRN
  Administered 2016-04-05: 200 mg via INTRAVENOUS

## 2016-04-05 MED ORDER — DIPHENHYDRAMINE HCL 12.5 MG/5ML PO ELIX: 12.5000 mg | ORAL_SOLUTION | Freq: Four times a day (QID) | ORAL | Status: DC | PRN

## 2016-04-05 MED ORDER — DEXAMETHASONE SODIUM PHOSPHATE 4 MG/ML IJ SOLN
INTRAMUSCULAR | Status: DC | PRN
  Administered 2016-04-05: 10 mg via INTRAVENOUS

## 2016-04-05 MED ORDER — HEPARIN SODIUM (PORCINE) 1000 UNIT/ML IJ SOLN
INTRAMUSCULAR | Status: AC
  Filled 2016-04-05: qty 1

## 2016-04-05 MED ORDER — KCL IN DEXTROSE-NACL 20-5-0.45 MEQ/L-%-% IV SOLN
INTRAVENOUS | Status: AC
  Filled 2016-04-05: qty 1000

## 2016-04-05 MED ORDER — DEXAMETHASONE SODIUM PHOSPHATE 10 MG/ML IJ SOLN
INTRAMUSCULAR | Status: AC
  Filled 2016-04-05: qty 1

## 2016-04-05 MED ORDER — LACTATED RINGERS IV SOLN
INTRAVENOUS | Status: DC | PRN
  Administered 2016-04-05: 08:00:00

## 2016-04-05 MED ORDER — PHENYLEPHRINE 40 MCG/ML (10ML) SYRINGE FOR IV PUSH (FOR BLOOD PRESSURE SUPPORT)
PREFILLED_SYRINGE | INTRAVENOUS | Status: AC
  Filled 2016-04-05: qty 10

## 2016-04-05 MED ORDER — BUPIVACAINE-EPINEPHRINE (PF) 0.25% -1:200000 IJ SOLN
INTRAMUSCULAR | Status: AC
  Filled 2016-04-05: qty 30

## 2016-04-05 MED ORDER — ONDANSETRON HCL 4 MG/2ML IJ SOLN
INTRAMUSCULAR | Status: DC | PRN
  Administered 2016-04-05: 4 mg via INTRAVENOUS

## 2016-04-05 MED ORDER — ACETAMINOPHEN 325 MG PO TABS
650.0000 mg | ORAL_TABLET | ORAL | Status: DC | PRN
  Administered 2016-04-05 (×2): 650 mg via ORAL
  Filled 2016-04-05 (×2): qty 2

## 2016-04-05 MED ORDER — PROPOFOL 10 MG/ML IV BOLUS
INTRAVENOUS | Status: AC
  Filled 2016-04-05: qty 20

## 2016-04-05 MED ORDER — LABETALOL HCL 5 MG/ML IV SOLN
INTRAVENOUS | Status: DC | PRN
  Administered 2016-04-05: 5 mg via INTRAVENOUS

## 2016-04-05 MED ORDER — BUPIVACAINE-EPINEPHRINE 0.25% -1:200000 IJ SOLN
INTRAMUSCULAR | Status: DC | PRN
  Administered 2016-04-05: 30 mL

## 2016-04-05 MED ORDER — HYDROMORPHONE HCL 1 MG/ML IJ SOLN: 0.2500 mg | INTRAMUSCULAR | Status: DC | PRN

## 2016-04-05 MED ORDER — DIPHENHYDRAMINE HCL 50 MG/ML IJ SOLN: 12.5000 mg | Freq: Four times a day (QID) | INTRAMUSCULAR | Status: DC | PRN

## 2016-04-05 MED ORDER — DOCUSATE SODIUM 100 MG PO CAPS
100.0000 mg | ORAL_CAPSULE | Freq: Two times a day (BID) | ORAL | Status: DC
  Administered 2016-04-05 – 2016-04-06 (×2): 100 mg via ORAL
  Filled 2016-04-05 (×2): qty 1

## 2016-04-05 MED ORDER — PROPOFOL 10 MG/ML IV BOLUS
INTRAVENOUS | Status: DC | PRN
  Administered 2016-04-05: 150 mg via INTRAVENOUS

## 2016-04-05 MED ORDER — VANCOMYCIN HCL IN DEXTROSE 1-5 GM/200ML-% IV SOLN
1000.0000 mg | Freq: Two times a day (BID) | INTRAVENOUS | Status: AC
  Administered 2016-04-05: 1000 mg via INTRAVENOUS

## 2016-04-05 MED ORDER — HYDRALAZINE HCL 20 MG/ML IJ SOLN
10.0000 mg | INTRAMUSCULAR | Status: DC | PRN
  Administered 2016-04-05: 10 mg via INTRAVENOUS

## 2016-04-05 SURGICAL SUPPLY — 53 items
APPLICATOR COTTON TIP 6IN STRL (MISCELLANEOUS) ×4 IMPLANT
CATH FOLEY 2WAY SLVR 18FR 30CC (CATHETERS) ×4 IMPLANT
CATH ROBINSON RED A/P 16FR (CATHETERS) ×4 IMPLANT
CATH ROBINSON RED A/P 8FR (CATHETERS) ×4 IMPLANT
CATH TIEMANN FOLEY 18FR 5CC (CATHETERS) ×4 IMPLANT
CHLORAPREP W/TINT 26ML (MISCELLANEOUS) ×4 IMPLANT
CLIP LIGATING HEM O LOK PURPLE (MISCELLANEOUS) ×10 IMPLANT
COVER SURGICAL LIGHT HANDLE (MISCELLANEOUS) ×4 IMPLANT
COVER TIP SHEARS 8 DVNC (MISCELLANEOUS) ×2 IMPLANT
COVER TIP SHEARS 8MM DA VINCI (MISCELLANEOUS) ×2
CUTTER ECHEON FLEX ENDO 45 340 (ENDOMECHANICALS) ×4 IMPLANT
DECANTER SPIKE VIAL GLASS SM (MISCELLANEOUS) ×4 IMPLANT
DRAPE ARM DVNC X/XI (DISPOSABLE) ×8 IMPLANT
DRAPE COLUMN DVNC XI (DISPOSABLE) ×2 IMPLANT
DRAPE DA VINCI XI ARM (DISPOSABLE) ×8
DRAPE DA VINCI XI COLUMN (DISPOSABLE) ×2
DRAPE SURG IRRIG POUCH 19X23 (DRAPES) ×4 IMPLANT
DRSG TEGADERM 4X4.75 (GAUZE/BANDAGES/DRESSINGS) ×4 IMPLANT
DRSG TEGADERM 8X12 (GAUZE/BANDAGES/DRESSINGS) ×2 IMPLANT
ELECT REM PT RETURN 9FT ADLT (ELECTROSURGICAL) ×4
ELECTRODE REM PT RTRN 9FT ADLT (ELECTROSURGICAL) ×2 IMPLANT
GLOVE BIO SURGEON STRL SZ 6.5 (GLOVE) ×3 IMPLANT
GLOVE BIO SURGEONS STRL SZ 6.5 (GLOVE) ×1
GLOVE BIOGEL M STRL SZ7.5 (GLOVE) ×8 IMPLANT
GOWN STRL REUS W/TWL LRG LVL3 (GOWN DISPOSABLE) ×12 IMPLANT
HEMOSTAT SURGICEL 4X8 (HEMOSTASIS) ×2 IMPLANT
HOLDER FOLEY CATH W/STRAP (MISCELLANEOUS) ×4 IMPLANT
IV LACTATED RINGERS 1000ML (IV SOLUTION) ×4 IMPLANT
LIQUID BAND (GAUZE/BANDAGES/DRESSINGS) ×2 IMPLANT
NDL SAFETY ECLIPSE 18X1.5 (NEEDLE) ×2 IMPLANT
NEEDLE HYPO 18GX1.5 SHARP (NEEDLE) ×4
PACK ROBOT UROLOGY CUSTOM (CUSTOM PROCEDURE TRAY) ×4 IMPLANT
RELOAD GREEN ECHELON 45 (STAPLE) ×4 IMPLANT
SEAL CANN UNIV 5-8 DVNC XI (MISCELLANEOUS) ×8 IMPLANT
SEAL XI 5MM-8MM UNIVERSAL (MISCELLANEOUS) ×8
SET TUBE IRRIG SUCTION NO TIP (IRRIGATION / IRRIGATOR) ×4 IMPLANT
SOLUTION ELECTROLUBE (MISCELLANEOUS) ×4 IMPLANT
SUT ETHILON 3 0 PS 1 (SUTURE) ×4 IMPLANT
SUT MNCRL 3 0 RB1 (SUTURE) ×2 IMPLANT
SUT MNCRL 3 0 VIOLET RB1 (SUTURE) ×2 IMPLANT
SUT MNCRL AB 4-0 PS2 18 (SUTURE) ×8 IMPLANT
SUT MONOCRYL 3 0 RB1 (SUTURE) ×4
SUT VIC AB 0 CT1 27 (SUTURE) ×4
SUT VIC AB 0 CT1 27XBRD ANTBC (SUTURE) ×2 IMPLANT
SUT VIC AB 0 UR5 27 (SUTURE) ×4 IMPLANT
SUT VIC AB 2-0 SH 27 (SUTURE) ×4
SUT VIC AB 2-0 SH 27X BRD (SUTURE) ×2 IMPLANT
SUT VICRYL 0 UR6 27IN ABS (SUTURE) ×8 IMPLANT
SYR 27GX1/2 1ML LL SAFETY (SYRINGE) ×4 IMPLANT
TOWEL OR 17X26 10 PK STRL BLUE (TOWEL DISPOSABLE) ×4 IMPLANT
TOWEL OR NON WOVEN STRL DISP B (DISPOSABLE) ×4 IMPLANT
TUBING INSUFFLATION 10FT LAP (TUBING) IMPLANT
WATER STERILE IRR 1500ML POUR (IV SOLUTION) ×6 IMPLANT

## 2016-04-05 NOTE — Anesthesia Postprocedure Evaluation (Signed)
Anesthesia Post Note  Patient: Lance Stevens  Procedure(s) Performed: Procedure(s) (LRB): XI ROBOTIC ASSISTED LAPAROSCOPIC RADICAL PROSTATECTOMY LEVEL 2 (N/A) LYMPHADENECTOMY, BILATERAL PELVIC LYMPHADENECTOMY (Bilateral)  Patient location during evaluation: PACU Anesthesia Type: General Level of consciousness: awake and alert Pain management: pain level controlled Vital Signs Assessment: post-procedure vital signs reviewed and stable Respiratory status: spontaneous breathing, nonlabored ventilation, respiratory function stable and patient connected to nasal cannula oxygen Cardiovascular status: blood pressure returned to baseline and stable Postop Assessment: no signs of nausea or vomiting Anesthetic complications: no    Last Vitals:  Filed Vitals:   04/05/16 1115 04/05/16 1130  BP: 178/83 175/89  Pulse: 72 77  Temp:  36.3 C  Resp: 12 16    Last Pain: There were no vitals filed for this visit.               Sherron Mapp L

## 2016-04-05 NOTE — Transfer of Care (Signed)
Immediate Anesthesia Transfer of Care Note  Patient: Lance Stevens  Procedure(s) Performed: Procedure(s): XI ROBOTIC ASSISTED LAPAROSCOPIC RADICAL PROSTATECTOMY LEVEL 2 (N/A) LYMPHADENECTOMY, BILATERAL PELVIC LYMPHADENECTOMY (Bilateral)  Patient Location: PACU  Anesthesia Type:General  Level of Consciousness: Patient easily awoken, sedated, comfortable, cooperative, following commands, responds to stimulation.   Airway & Oxygen Therapy: Patient spontaneously breathing, ventilating well, oxygen via simple oxygen mask.  Post-op Assessment: Report given to PACU RN, vital signs reviewed and stable, moving all extremities.   Post vital signs: Reviewed and stable.  Complications: No apparent anesthesia complications

## 2016-04-05 NOTE — Op Note (Signed)
Preoperative diagnosis: Clinically localized adenocarcinoma of the prostate (clinical stage T2a N0 M0)  Postoperative diagnosis: Clinically localized adenocarcinoma of the prostate (clinical stage T2a N0 M0)  Procedure:  1. Robotic assisted laparoscopic radical prostatectomy (Right nerve sparing) 2. Bilateral robotic assisted laparoscopic extended pelvic lymphadenectomy  Surgeon: Pryor Curia. M.D.  Assistant(s):Amanda Dancy, PA-C  Anesthesia: General  Complications: None  EBL: 50 mL  IVF:  1300 mL crystalloid  Specimens: 1. Prostate and seminal vesicles 2. Right pelvic lymph nodes 3. Left pelvic lymph nodes  Disposition of specimens: Pathology  Drains: 1. 20 Fr coude catheter 2. # 19 Blake pelvic drain  Indication: Lance Stevens is a 70 y.o. patient with clinically localized prostate cancer.  After a thorough review of the management options for treatment of prostate cancer, he elected to proceed with surgical therapy and the above procedure(s).  We have discussed the potential benefits and risks of the procedure, side effects of the proposed treatment, the likelihood of the patient achieving the goals of the procedure, and any potential problems that might occur during the procedure or recuperation. Informed consent has been obtained.  Description of procedure:  The patient was taken to the operating room and a general anesthetic was administered. He was given preoperative antibiotics, placed in the dorsal lithotomy position, and prepped and draped in the usual sterile fashion. Next a preoperative timeout was performed. A urethral catheter was placed into the bladder and a site was selected near the umbilicus for placement of the camera port. This was placed using a standard open Hassan technique which allowed entry into the peritoneal cavity under direct vision and without difficulty. An 8 mm port was placed and a pneumoperitoneum established. The camera was then used  to inspect the abdomen and there was no evidence of any intra-abdominal injuries or other abnormalities. The remaining abdominal ports were then placed. 8 mm robotic ports were placed in the right lower quadrant, left lower quadrant, and far left lateral abdominal wall. A 5 mm port was placed in the right upper quadrant and a 12 mm port was placed in the right lateral abdominal wall for laparoscopic assistance. All ports were placed under direct vision without difficulty. The surgical cart was then docked.   Utilizing the cautery scissors, the bladder was reflected posteriorly allowing entry into the space of Retzius and identification of the endopelvic fascia and prostate. The periprostatic fat was then removed from the prostate allowing full exposure of the endopelvic fascia. The endopelvic fascia was then incised from the apex back to the base of the prostate bilaterally and the underlying levator muscle fibers were swept laterally off the prostate thereby isolating the dorsal venous complex. The dorsal vein was then stapled and divided with a 45 mm Flex Echelon stapler. Attention then turned to the bladder neck which was divided anteriorly thereby allowing entry into the bladder and exposure of the urethral catheter. The catheter balloon was deflated and the catheter was brought into the operative field and used to retract the prostate anteriorly. The posterior bladder neck was then examined and was divided allowing further dissection between the bladder and prostate posteriorly until the vasa deferentia and seminal vessels were identified. The vasa deferentia were isolated, divided, and lifted anteriorly. The seminal vesicles were dissected down to their tips with care to control the seminal vascular arterial blood supply. These structures were then lifted anteriorly and the space between Denonvillier's fascia and the anterior rectum was developed with a combination of sharp  and blunt dissection. This  isolated the vascular pedicles of the prostate.  The lateral prostatic fascia on the right side of the prostate was then sharply incised allowing release of the neurovascular bundle. The vascular pedicle of the prostate on the right side was then ligated with Weck clips between the prostate and neurovascular bundle and divided with sharp cold scissor dissection resulting in neurovascular bundle preservation. On the left side, a wide non nerve sparing dissection was performed with Weck clips used to ligate the vascular pedicle of the prostate. The neurovascular bundle on the right side was then separated off the apex of the prostate and urethra.  The urethra was then sharply transected allowing the prostate specimen to be disarticulated. The pelvis was copiously irrigated and hemostasis was ensured. There was no evidence for rectal injury.  Attention then turned to the right pelvic sidewall. The fibrofatty tissue extending from the genitofemoral nerve laterally to the confluence of the iliac vessels proximally to the hypogastric artery posteriorly and Cooper's ligament distally was dissected free from the pelvic sidewall with care to preserve the obturator nerve and major vascular structures. Weck clips were used for lymphostasis and hemostasis. An identical procedure was then performed on the contralateral side and the lymphatic packets were removed for permanent pathologic analysis.  Attention then turned to the urethral anastomosis. A 2-0 Vicryl slip knot was placed between Denonvillier's fascia, the posterior bladder neck, and the posterior urethra to reapproximate these structures. A double-armed 3-0 Monocryl suture was then used to perform a 360 running tension-free anastomosis between the bladder neck and urethra. A new urethral catheter was then placed into the bladder and irrigated. There were no blood clots within the bladder and the anastomosis appeared to be watertight. A #19 Blake drain was then  brought through the left lateral 8 mm port site and positioned appropriately within the pelvis. It was secured to the skin with a nylon suture. The surgical cart was then undocked. The right lateral 12 mm port site was closed at the fascial level with a 0 Vicryl suture placed laparoscopically. All remaining ports were then removed under direct vision. The prostate specimen was removed intact within the Endopouch retrieval bag via the periumbilical camera port site. This fascial opening was closed with two running 0 Vicryl sutures. 0.25% Marcaine was then injected into all port sites and all incisions were reapproximated at the skin level with staples. Sterile dressings were applied. The patient appeared to tolerate the procedure well and without complications. The patient was able to be extubated and transferred to the recovery unit in satisfactory condition.  Pryor Curia MD

## 2016-04-05 NOTE — Progress Notes (Signed)
Post-op note  Subjective: The patient is doing well.  No complaints.  BP elevated   Objective: Vital signs in last 24 hours: Temp:  [97.4 F (36.3 C)-98 F (36.7 C)] 97.4 F (36.3 C) (05/01 1147) Pulse Rate:  [69-77] 72 (05/01 1147) Resp:  [12-18] 16 (05/01 1147) BP: (159-195)/(70-174) 195/70 mmHg (05/01 1147) SpO2:  [97 %-100 %] 99 % (05/01 1147) Weight:  [98.431 kg (217 lb)] 98.431 kg (217 lb) (05/01 0530)  Intake/Output from previous day:   Intake/Output this shift: Total I/O In: 2400 [I.V.:1400; IV Piggyback:1000] Out: 190 [Urine:100; Drains:40; Blood:50]  Physical Exam:  General: Alert and oriented. Abdomen: Soft, Nondistended. Incisions: Clean and dry. Urine: amber  Lab Results:  Recent Labs  04/05/16 1050  HGB 13.7  HCT 39.5    Assessment/Plan: POD#0   1) Continue to monitor  2) DVT prophy, clears, IS, amb, pain control  3) Monitor BP; not on anti-hypertensive at home   LOS: 0 days   Lance Stevens, [place] 04/05/2016, 1:33 PM

## 2016-04-05 NOTE — Interval H&P Note (Signed)
History and Physical Interval Note:  04/05/2016 6:54 AM  Lance Stevens  has presented today for surgery, with the diagnosis of PROSTATE CANCER  The various methods of treatment have been discussed with the patient and family. After consideration of risks, benefits and other options for treatment, the patient has consented to  Procedure(s): XI ROBOTIC ASSISTED LAPAROSCOPIC RADICAL PROSTATECTOMY LEVEL 2 (N/A) LYMPHADENECTOMY, BILATERAL PELVIC LYMPHADENECTOMY (Bilateral) as a surgical intervention .  The patient's history has been reviewed, patient examined, no change in status, stable for surgery.  I have reviewed the patient's chart and labs.  Questions were answered to the patient's satisfaction.     Enrica Corliss,LES

## 2016-04-05 NOTE — Anesthesia Procedure Notes (Signed)
Procedure Name: Intubation Date/Time: 04/05/2016 7:27 AM Performed by: Deliah Boston Pre-anesthesia Checklist: Patient identified, Emergency Drugs available, Suction available and Patient being monitored Patient Re-evaluated:Patient Re-evaluated prior to inductionOxygen Delivery Method: Circle System Utilized Preoxygenation: Pre-oxygenation with 100% oxygen Intubation Type: IV induction Ventilation: Mask ventilation without difficulty Laryngoscope Size: Mac and 4 Tube type: Oral Tube size: 8.0 mm Number of attempts: 1 Airway Equipment and Method: Oral airway Placement Confirmation: ETT inserted through vocal cords under direct vision,  positive ETCO2 and breath sounds checked- equal and bilateral Secured at: 22 cm Tube secured with: Tape Dental Injury: Teeth and Oropharynx as per pre-operative assessment and Injury to lip  Comments: Small nick to upper lip

## 2016-04-05 NOTE — Discharge Instructions (Signed)

## 2016-04-05 NOTE — Interval H&P Note (Signed)
History and Physical Interval Note:  04/05/2016 6:52 AM  Lance Stevens  has presented today for surgery, with the diagnosis of PROSTATE CANCER  The various methods of treatment have been discussed with the patient and family. After consideration of risks, benefits and other options for treatment, the patient has consented to  Procedure(s): XI ROBOTIC ASSISTED LAPAROSCOPIC RADICAL PROSTATECTOMY LEVEL 2 (N/A) LYMPHADENECTOMY, BILATERAL PELVIC LYMPHADENECTOMY (Bilateral) as a surgical intervention .  The patient's history has been reviewed, patient examined, no change in status, stable for surgery.  I have reviewed the patient's chart and labs.  Questions were answered to the patient's satisfaction.     Hazelgrace Bonham,LES

## 2016-04-06 ENCOUNTER — Ambulatory Visit: Payer: Medicare Other | Admitting: Physician Assistant

## 2016-04-06 LAB — HEMOGLOBIN AND HEMATOCRIT, BLOOD
HEMATOCRIT: 35.5 % — AB (ref 39.0–52.0)
Hemoglobin: 12.2 g/dL — ABNORMAL LOW (ref 13.0–17.0)

## 2016-04-06 MED ORDER — HYDROCODONE-ACETAMINOPHEN 5-325 MG PO TABS: 1.0000 | ORAL_TABLET | Freq: Four times a day (QID) | ORAL | Status: DC | PRN

## 2016-04-06 MED ORDER — BISACODYL 10 MG RE SUPP
10.0000 mg | Freq: Once | RECTAL | Status: AC
Start: 2016-04-06 — End: 2016-04-06
  Administered 2016-04-06: 10 mg via RECTAL
  Filled 2016-04-06: qty 1

## 2016-04-06 NOTE — Progress Notes (Signed)
Patient ID: Lance Stevens, male   DOB: [birth year], 70 y.o.   MRN: [id number]  1 Day Post-Op Subjective: The patient is doing well.  No nausea or vomiting. Pain is adequately controlled.  Objective: Vital signs in last 24 hours: Temp:  [97.4 F (36.3 C)-98.3 F (36.8 C)] 97.8 F (36.6 C) (05/02 0612) Pulse Rate:  [61-79] 61 (05/02 0612) Resp:  [12-18] 18 (05/02 0612) BP: (135-195)/(64-174) 142/72 mmHg (05/02 0612) SpO2:  [96 %-100 %] 98 % (05/02 0612)  Intake/Output from previous day: 05/01 0701 - 05/02 0700 In: 6055 [P.O.:720; I.V.:4335; IV Piggyback:1000] Out: 2810 [Urine:2500; Drains:260; Blood:50] Intake/Output this shift:    Physical Exam:  General: Alert and oriented. CV: RRR Lungs: Clear bilaterally. GI: Soft, Nondistended. Incisions: Clean, dry, and intact Urine: Clear Extremities: Nontender, no erythema, no edema.  Lab Results:  Recent Labs  04/05/16 1050 04/06/16 0511  HGB 13.7 12.2*  HCT 39.5 35.5*      Assessment/Plan: POD# 1 s/p robotic prostatectomy.  1) SL IVF 2) Ambulate, Incentive spirometry 3) Transition to oral pain medication 4) Dulcolax suppository 5) D/C pelvic drain 6) Plan for likely discharge later today   Pryor Curia. MD   LOS: 1 day   Shane Melby,LES 04/06/2016, 7:24 AM

## 2016-04-06 NOTE — Discharge Summary (Signed)
  Date of admission: 04/05/2016  Date of discharge: 04/06/2016  Admission diagnosis: Prostate Cancer  Discharge diagnosis: Prostate Cancer  History and Physical: For full details, please see admission history and physical. Briefly, Lance Stevens is a 70 y.o. gentleman with localized prostate cancer.  After discussing management/treatment options, he elected to proceed with surgical treatment.  Hospital Course: Lance Stevens was taken to the operating room on 04/05/2016 and underwent a robotic assisted laparoscopic radical prostatectomy. He tolerated this procedure well and without complications. Postoperatively, he was able to be transferred to a regular hospital room following recovery from anesthesia.  He was able to begin ambulating the night of surgery. He remained hemodynamically stable overnight.  He had excellent urine output with appropriately minimal output from his pelvic drain and his pelvic drain was removed on POD #1.  He was transitioned to oral pain medication, tolerated a clear liquid diet, and had met all discharge criteria and was able to be discharged home later on POD#1.  Laboratory values:  Recent Labs  04/05/16 1050 04/06/16 0511  HGB 13.7 12.2*  HCT 39.5 35.5*    Disposition: Home  Discharge instruction: He was instructed to be ambulatory but to refrain from heavy lifting, strenuous activity, or driving. He was instructed on urethral catheter care.  Discharge medications:     Medication List    STOP taking these medications        aspirin 325 MG EC tablet     KRILL OIL PO     multivitamin with minerals Tabs tablet     sildenafil 100 MG tablet  Commonly known as:  VIAGRA      TAKE these medications        fenofibrate 160 MG tablet  Take 1 tablet (160 mg total) by mouth daily.     HYDROcodone-acetaminophen 5-325 MG tablet  Commonly known as:  NORCO  Take 1-2 tablets by mouth every 6 (six) hours as needed for moderate pain.     sulfamethoxazole-trimethoprim 800-160 MG tablet  Commonly known as:  BACTRIM DS,SEPTRA DS  Take 1 tablet by mouth 2 (two) times daily. Start the day prior to foley removal appointment        Followup: He will followup in 1 week for catheter removal and to discuss his surgical pathology results.

## 2016-04-12 NOTE — Progress Notes (Signed)
Oncology Nurse Navigator Documentation  Oncology Nurse Navigator Flowsheets 03/24/2016 04/05/2016 04/12/2016  Navigator Location - - -  Navigator Encounter Type Telephone - Telephone  Telephone Outgoing Call;Diagnostic Results - Incoming Call  Abnormal Finding Date - - -  Confirmed Diagnosis Date - - -  Surgery Date - 04/05/2016 -  Patient Visit Type - - -  Barriers/Navigation Needs Education - Education- Lance Stevens called to inform me that he had his surgery rescheduled to May 1. I was not aware so I did not get to see him post op. He states the surgery went well and he is feeling well except for the soreness and the catheter. He states that he has a thick "pad" on his lower back and is not sure if he can remove. I explained to him that they place this on all surgical patients to relieve pressure over the lower spine and he can remove it. He has a follow up with Dr. Alinda Money 04/13/16 to have his catheter removed and get his pathology results.I asked him to call me with any questions or concerns. He voiced understanding.  Education Preparing for Soil scientist - Other  Interventions Education Method - Education Method  Coordination of Care - - -  Education Method Verbal - Verbal  Support Groups/Services - - -  Acuity - - -  Acuity Level 2 - - -  Time Spent with Patient 15 - 15

## 2016-04-19 ENCOUNTER — Encounter: Payer: Self-pay | Admitting: Medical Oncology

## 2016-04-19 ENCOUNTER — Telehealth: Payer: Self-pay | Admitting: Medical Oncology

## 2016-04-20 ENCOUNTER — Telehealth: Payer: Self-pay | Admitting: Medical Oncology

## 2016-04-20 NOTE — Telephone Encounter (Signed)
Oncology Nurse Navigator Documentation  Oncology Nurse Navigator Flowsheets 04/19/2016 04/19/2016 04/20/2016  Navigator Location - - -  Navigator Encounter Type Telephone Telephone Telephone  Telephone Outgoing Call;Appt Confirmation/Clarification Outgoing Call;Appt Confirmation/Clarification Incoming Call;Symptom Mgt  Abnormal Finding Date - - -  Confirmed Diagnosis Date - - -  Surgery Date - - -  Patient Visit Type - - -  Barriers/Navigation Needs Coordination of Care Coordination of Care -  Education - - Pain/ Symptom Management- Lance Stevens called stating that he got his catheter out last week. He is doing his exercises for bladder training.He states that during the night he feels the urge to void but during the day he does not and has continuous leakage of urine. I discussed this is a process and it will take time for him to regain continence over the next few months. I encourage him to be diligent about doing his exercises and not to be hard on himself. He also states that he was told he could get Viagra to help him with his erectile dysfunction but did not receive any samples or a prescription. I explained that he needs to heal form his surgery and regain continence before he begins rehabilitation for erectile dysfunction. I informed him that Dr. Alinda Money will discuss this with him and prescribe the medication for him. He voiced understanding. He states that he has developed a slight rash from wearing the undergarments for incontinence. I told him he could use protective ointments like A&D to see if this helps. He will try this to see if it helps. I asked him to call me with any questions or concerns. He voiced understanding.  Interventions Coordination of Care Coordination of Care Education Method  Coordination of Care Appts Appts -  Education Method - - Teach-back;Verbal  Support Groups/Services - - -  Acuity - - -  Acuity Level 2 - - -  Time Spent with Patient 15 15 15

## 2016-04-20 NOTE — Progress Notes (Signed)
Oncology Nurse Navigator Documentation  Oncology Nurse Navigator Flowsheets 04/12/2016 04/19/2016 04/19/2016  Navigator Location - - -  Navigator Encounter Type Telephone Telephone Telephone  Telephone Incoming Call Incoming Call Outgoing Call;Appt Confirmation/Clarification  Abnormal Finding Date - - -  Confirmed Diagnosis Date - - -  Surgery Date - - -  Patient Visit Type - - -  Barriers/Navigation Needs Education Coordination of Care Coordination of Care- I called [facility] to set appointments for Lance Stevens. Per Dr. Alinda Money he needs a PSA and follow up early July. Appointment for lab July 6th anytime before 4 pm and Dr. Alinda Money July 11 th at 12 noon. I will call patient with appointments.  Education Other - -  Interventions Education Method Coordination of Care Coordination of Care  Coordination of Care - Appts Appts  Education Method Verbal - -  Support Groups/Services - - -  Acuity - - -  Acuity Level 2 - - -  Time Spent with Patient [id number]

## 2016-04-20 NOTE — Progress Notes (Signed)
Oncology Nurse Navigator Documentation  Oncology Nurse Navigator Flowsheets 04/05/2016 04/12/2016 04/19/2016  Navigator Location - - -  Navigator Encounter Type - Telephone Telephone  Telephone - Incoming Call Incoming Call  Abnormal Finding Date - - -  Confirmed Diagnosis Date - - -  Surgery Date 04/05/2016 - -  Patient Visit Type - - -  Barriers/Navigation Needs - Education Coordination of Care- [name] called stating that he saw Dr. Alinda Money last week for his surgical follow up. He did not get his follow up appointments before leaving [facility]. He has called and left several messages without being successful. He asked if I could help him get these appointments. I will be glad to follow up and call him back. He voiced understanding.  Education - Other -  Interventions - Education Method Coordination of Care  Coordination of Care - - Appts  Education Method - Verbal -  Support Groups/Services - - -  Acuity - - -  Acuity Level 2 - - -  Time Spent with Patient - [id number]

## 2016-04-20 NOTE — Telephone Encounter (Signed)
Oncology Nurse Navigator Documentation  Oncology Nurse Navigator Flowsheets 04/19/2016 04/19/2016 04/19/2016  Navigator Location - - -  Navigator Encounter Type Telephone Telephone Telephone  Telephone Incoming Call Outgoing Call;Appt Confirmation/Clarification Outgoing Call;Appt Confirmation/Clarification  Abnormal Finding Date - - -  Confirmed Diagnosis Date - - -  Surgery Date - - -  Patient Visit Type - - -  Barriers/Navigation Needs Coordination of Care Coordination of Care Coordination of Care- Called Mr. Crossett with appointments for PSA 7/6 arriving before 4 pm and Dr. Alinda Money 7/11 at 12 noon. He voiced understanding and thanked me for helping him get these appointments scheduled.  Education - - -  Interventions Coordination of Care Coordination of Care Coordination of Care  Coordination of Care Appts Appts Appts  Education Method - - -  Support Groups/Services - - -  Acuity - - -  Acuity Level 2 - - -  Time Spent with Patient 15 15 15

## 2016-06-21 ENCOUNTER — Encounter: Payer: Self-pay | Admitting: Physician Assistant

## 2016-06-21 DIAGNOSIS — C61 Malignant neoplasm of prostate: Secondary | ICD-10-CM

## 2016-06-30 ENCOUNTER — Telehealth: Payer: Self-pay | Admitting: Medical Oncology

## 2016-06-30 NOTE — Telephone Encounter (Signed)
Oncology Nurse Navigator Documentation  Oncology Nurse Navigator Flowsheets 04/19/2016 04/20/2016 06/30/2016  Navigator Location - - -  Navigator Encounter Type Telephone Telephone Telephone  Telephone Outgoing Call;Appt Confirmation/Clarification Incoming Call;Symptom Mgt Incoming Call- Mr. Mcculloh left a message regarding coverage of medication  Abnormal Finding Date - - -  Confirmed Diagnosis Date - - -  Surgery Date - - -  Patient Visit Type - - -  Barriers/Navigation Needs Coordination of Care - -  Education - Pain/ Symptom Management -  Interventions Coordination of Care Education Method -  Coordination of Care Appts - -  Education Method - Teach-back;Verbal -  Support Groups/Services - - -  Acuity - - -  Acuity Level 2 - - -  Time Spent with Patient 15 15 15

## 2016-06-30 NOTE — Telephone Encounter (Signed)
Oncology Nurse Navigator Documentation  Oncology Nurse Navigator Flowsheets 04/20/2016 06/30/2016 06/30/2016  Navigator Location - - -  Navigator Encounter Type Telephone Telephone Telephone;3 month  Telephone Incoming Call;Symptom Mgt Incoming Call Outgoing Call-Lance Stevens states he is doing well post prostatectomy. He PSA was non-detectable and will see Dr. Alinda Money in 6 months.Dr.Borden had discussed medications to help with erectile dysfunction. Due to cost Dr. Alinda Money had discussed generic sildenafil but he can not remember where to get. I asked him to call Dr. Lynne Logan nurse Nira Conn and discuss with her. He voiced understanding. I asked him to call me if I can be of assistance in any way. He voiced understanding.  Abnormal Finding Date - - -  Confirmed Diagnosis Date - - -  Surgery Date - - -  Patient Visit Type - - -  Barriers/Navigation Needs - - Education  Education Pain/ Symptom Management - Other  Interventions Education Method - Education Method  Coordination of Care - - -  Education Method Teach-back;Verbal - -  Support Groups/Services - - -  Acuity - - -  Acuity Level 2 - - -  Time Spent with Patient 15 15 -

## 2016-07-10 ENCOUNTER — Encounter (HOSPITAL_COMMUNITY): Payer: Self-pay | Admitting: *Deleted

## 2016-07-10 ENCOUNTER — Observation Stay (HOSPITAL_COMMUNITY)
Admission: EM | Admit: 2016-07-10 | Discharge: 2016-07-12 | Disposition: A | Discharge status: Home / Self Care | Payer: Medicare Other | Attending: Internal Medicine | Admitting: Internal Medicine

## 2016-07-10 ENCOUNTER — Emergency Department (HOSPITAL_COMMUNITY): Payer: Medicare Other

## 2016-07-10 DIAGNOSIS — R0789 Other chest pain: Principal | ICD-10-CM | POA: Insufficient documentation

## 2016-07-10 DIAGNOSIS — E785 Hyperlipidemia, unspecified: Secondary | ICD-10-CM | POA: Diagnosis not present

## 2016-07-10 DIAGNOSIS — R072 Precordial pain: Secondary | ICD-10-CM | POA: Diagnosis not present

## 2016-07-10 DIAGNOSIS — I25110 Atherosclerotic heart disease of native coronary artery with unstable angina pectoris: Secondary | ICD-10-CM | POA: Diagnosis not present

## 2016-07-10 DIAGNOSIS — Z7982 Long term (current) use of aspirin: Secondary | ICD-10-CM | POA: Insufficient documentation

## 2016-07-10 DIAGNOSIS — N183 Chronic kidney disease, stage 3 (moderate): Secondary | ICD-10-CM | POA: Diagnosis not present

## 2016-07-10 DIAGNOSIS — Z880 Allergy status to penicillin: Secondary | ICD-10-CM | POA: Insufficient documentation

## 2016-07-10 DIAGNOSIS — K2970 Gastritis, unspecified, without bleeding: Secondary | ICD-10-CM | POA: Diagnosis not present

## 2016-07-10 DIAGNOSIS — Z8249 Family history of ischemic heart disease and other diseases of the circulatory system: Secondary | ICD-10-CM | POA: Diagnosis not present

## 2016-07-10 DIAGNOSIS — I2584 Coronary atherosclerosis due to calcified coronary lesion: Secondary | ICD-10-CM | POA: Diagnosis not present

## 2016-07-10 DIAGNOSIS — R079 Chest pain, unspecified: Secondary | ICD-10-CM | POA: Diagnosis present

## 2016-07-10 DIAGNOSIS — E1122 Type 2 diabetes mellitus with diabetic chronic kidney disease: Secondary | ICD-10-CM | POA: Diagnosis not present

## 2016-07-10 DIAGNOSIS — I2510 Atherosclerotic heart disease of native coronary artery without angina pectoris: Secondary | ICD-10-CM

## 2016-07-10 DIAGNOSIS — Z8546 Personal history of malignant neoplasm of prostate: Secondary | ICD-10-CM | POA: Insufficient documentation

## 2016-07-10 DIAGNOSIS — I129 Hypertensive chronic kidney disease with stage 1 through stage 4 chronic kidney disease, or unspecified chronic kidney disease: Secondary | ICD-10-CM | POA: Insufficient documentation

## 2016-07-10 DIAGNOSIS — E781 Pure hyperglyceridemia: Secondary | ICD-10-CM | POA: Diagnosis not present

## 2016-07-10 DIAGNOSIS — N182 Chronic kidney disease, stage 2 (mild): Secondary | ICD-10-CM

## 2016-07-10 DIAGNOSIS — N179 Acute kidney failure, unspecified: Secondary | ICD-10-CM | POA: Diagnosis present

## 2016-07-10 DIAGNOSIS — Z85828 Personal history of other malignant neoplasm of skin: Secondary | ICD-10-CM | POA: Insufficient documentation

## 2016-07-10 LAB — CBC WITH DIFFERENTIAL/PLATELET
BASOS ABS: 0 10*3/uL (ref 0.0–0.1)
BASOS PCT: 0 %
EOS PCT: 4 %
Eosinophils Absolute: 0.3 10*3/uL (ref 0.0–0.7)
HCT: 41 % (ref 39.0–52.0)
Hemoglobin: 13.7 g/dL (ref 13.0–17.0)
Lymphocytes Relative: 30 %
Lymphs Abs: 2.4 10*3/uL (ref 0.7–4.0)
MCH: 31.4 pg (ref 26.0–34.0)
MCHC: 33.4 g/dL (ref 30.0–36.0)
MCV: 93.8 fL (ref 78.0–100.0)
MONO ABS: 0.6 10*3/uL (ref 0.1–1.0)
Monocytes Relative: 7 %
Neutro Abs: 4.7 10*3/uL (ref 1.7–7.7)
Neutrophils Relative %: 59 %
PLATELETS: 193 10*3/uL (ref 150–400)
RBC: 4.37 MIL/uL (ref 4.22–5.81)
RDW: 12.7 % (ref 11.5–15.5)
WBC: 8 10*3/uL (ref 4.0–10.5)

## 2016-07-10 LAB — LIPID PANEL
Cholesterol: 196 mg/dL (ref 0–200)
HDL: 24 mg/dL — ABNORMAL LOW (ref >40)
LDL CALC: 99 mg/dL (ref 0–99)
Total CHOL/HDL Ratio: 8.2 RATIO
Triglycerides: 365 mg/dL — ABNORMAL HIGH (ref <150)
VLDL: 73 mg/dL — AB (ref 0–40)

## 2016-07-10 LAB — COMPREHENSIVE METABOLIC PANEL
ALBUMIN: 3.6 g/dL (ref 3.5–5.0)
ALT: 18 U/L (ref 17–63)
AST: 10 U/L — AB (ref 15–41)
Alkaline Phosphatase: 91 U/L (ref 38–126)
Anion gap: 8 (ref 5–15)
BUN: 26 mg/dL — AB (ref 6–20)
CHLORIDE: 105 mmol/L (ref 101–111)
CO2: 25 mmol/L (ref 22–32)
Calcium: 9.1 mg/dL (ref 8.9–10.3)
Creatinine, Ser: 1.34 mg/dL — ABNORMAL HIGH (ref 0.61–1.24)
GFR calc Af Amer: >60 mL/min (ref >60)
GFR, EST NON AFRICAN AMERICAN: 52 mL/min — AB (ref >60)
GLUCOSE: 143 mg/dL — AB (ref 65–99)
POTASSIUM: 4.1 mmol/L (ref 3.5–5.1)
Sodium: 138 mmol/L (ref 135–145)
Total Bilirubin: 0.8 mg/dL (ref 0.3–1.2)
Total Protein: 5.8 g/dL — ABNORMAL LOW (ref 6.5–8.1)

## 2016-07-10 LAB — TROPONIN I: Troponin I: <0.03 ng/mL (ref <0.03)

## 2016-07-10 MED ORDER — ATORVASTATIN CALCIUM 40 MG PO TABS
40.0000 mg | ORAL_TABLET | Freq: Every day | ORAL | Status: DC
  Administered 2016-07-10 – 2016-07-11 (×2): 40 mg via ORAL
  Filled 2016-07-10 (×3): qty 1

## 2016-07-10 MED ORDER — ONDANSETRON HCL 4 MG/2ML IJ SOLN
4.0000 mg | Freq: Once | INTRAMUSCULAR | Status: AC
  Administered 2016-07-10: 4 mg via INTRAVENOUS
  Filled 2016-07-10: qty 2

## 2016-07-10 MED ORDER — ASPIRIN EC 81 MG PO TBEC
81.0000 mg | DELAYED_RELEASE_TABLET | Freq: Every day | ORAL | Status: DC
  Administered 2016-07-10 – 2016-07-11 (×2): 81 mg via ORAL
  Filled 2016-07-10 (×3): qty 1

## 2016-07-10 MED ORDER — ONDANSETRON HCL 4 MG/2ML IJ SOLN: 4.0000 mg | Freq: Four times a day (QID) | INTRAMUSCULAR | Status: DC | PRN

## 2016-07-10 MED ORDER — ACETAMINOPHEN 325 MG PO TABS
650.0000 mg | ORAL_TABLET | ORAL | Status: DC | PRN
Start: 2016-07-10 — End: 2016-07-12

## 2016-07-10 MED ORDER — METOPROLOL TARTRATE 12.5 MG HALF TABLET: 12.5000 mg | ORAL_TABLET | Freq: Two times a day (BID) | ORAL | Status: DC

## 2016-07-10 MED ORDER — ENOXAPARIN SODIUM 40 MG/0.4ML ~~LOC~~ SOLN
40.0000 mg | SUBCUTANEOUS | Status: DC
  Administered 2016-07-10 – 2016-07-11 (×2): 40 mg via SUBCUTANEOUS
  Filled 2016-07-10 (×2): qty 0.4

## 2016-07-10 MED ORDER — AMLODIPINE BESYLATE 5 MG PO TABS
5.0000 mg | ORAL_TABLET | Freq: Every day | ORAL | Status: DC
  Administered 2016-07-10 – 2016-07-11 (×2): 5 mg via ORAL
  Filled 2016-07-10 (×3): qty 1

## 2016-07-10 MED ORDER — PANTOPRAZOLE SODIUM 20 MG PO TBEC
20.0000 mg | DELAYED_RELEASE_TABLET | Freq: Once | ORAL | Status: AC
  Administered 2016-07-10: 20 mg via ORAL
  Filled 2016-07-10: qty 1

## 2016-07-10 MED ORDER — METOPROLOL TARTRATE 25 MG PO TABS: 25.0000 mg | ORAL_TABLET | Freq: Two times a day (BID) | ORAL | Status: DC

## 2016-07-10 MED ORDER — GI COCKTAIL ~~LOC~~
30.0000 mL | Freq: Four times a day (QID) | ORAL | Status: DC | PRN
  Administered 2016-07-10: 30 mL via ORAL
  Filled 2016-07-10: qty 30

## 2016-07-10 NOTE — ED Notes (Signed)
Admitting MD at bedside.

## 2016-07-10 NOTE — H&P (Signed)
Primary cardiologist: New  HPI: 70 year old male for evaluation of chest pain. No prior cardiac history. Strong family history of coronary disease. He developed substernal chest pressure at approximately 11 PM last evening. No radiation. There was associated nausea and diaphoresis but no dyspnea. The pain was not pleuritic, positional or related to food. Lasted 3 hours and resolved. He otherwise does not have dyspnea on exertion, orthopnea, PND, pedal edema, exertional chest pain or syncope.  Medications Prior to Admission  Medication Sig Dispense Refill  . aspirin EC 325 MG tablet Take 650-975 mg by mouth once.      Allergies  Allergen Reactions  . Penicillins Swelling    As a child. Has patient had a PCN reaction causing immediate rash, facial/tongue/throat swelling, SOB or lightheadedness with hypotension: Yes Has patient had a PCN reaction causing severe rash involving mucus membranes or skin necrosis: No Has patient had a PCN reaction that required hospitalization: Was already in hospital when reaction happened Has patient had a PCN reaction occurring within the last 10 years: No  If all of the above answers are "NO", then may proceed with Cephalosporin use.      Past Medical History:  Diagnosis Date  . Allergy   . Borderline diabetes   . Bronchitis    recurrent  . Fracture, finger    LEFT HAND WITH SWELLING AND BRUISING  . History of gout   . History of kidney stones   . History of skin cancer    possible - pt not sure of dx  . Prostate cancer ([place])   . Sinus infection    recurrent    Past Surgical History:  Procedure Laterality Date  . EYE SURGERY     B cataracts removed.  Marland Kitchen EYE SURGERY     lens implants  . LITHOTRIPSY    . LYMPHADENECTOMY Bilateral 04/05/2016   Procedure: LYMPHADENECTOMY, BILATERAL PELVIC LYMPHADENECTOMY;  Surgeon: Raynelle Bring, MD;  Location: [facility];  Service: Urology;  Laterality: Bilateral;  . PROSTATE BIOPSY    . ROBOT ASSISTED  LAPAROSCOPIC RADICAL PROSTATECTOMY N/A 04/05/2016   Procedure: XI ROBOTIC ASSISTED LAPAROSCOPIC RADICAL PROSTATECTOMY LEVEL 2;  Surgeon: Raynelle Bring, MD;  Location: [facility];  Service: Urology;  Laterality: N/A;  . TONSILLECTOMY AND ADENOIDECTOMY     also received radiation treatments, as was frequent in children at the time  . WISDOM TOOTH EXTRACTION      Social History   Social History  . Marital status: Married    Spouse name: [name]  . Number of children: 3  . Years of education: N/A   Occupational History  . 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 Smoking status: Never Smoker  . Smokeless tobacco: Never Used  . Alcohol use 1.2 oz/week    2 Standard drinks or equivalent per week     Comment: wine - red 2 glasses wine per week  . Drug use: No  . Sexual activity: Yes    Partners: Female   Other Topics Concern  . Not on file   Social History Narrative   Marital status: married x 25 years; second marriage; first marriage x 12 years.      Children: 3 daughters and one step-daughter; 4 grandchildren      Lives: with wife.      Employment: semi-retired; Company secretary; professor; Social worker, Probation officer.      Tobacco:  no  Alcohol: about 2 glasses of wine/week      Exercise: active in the yard.    Family History  Problem Relation Age of Onset  . Heart attack Father   . Heart disease Father 3    AMI as cause of death  . Diabetes Father   . Hyperlipidemia Father   . Parkinson's disease Brother   . Diabetes Brother   . Cancer Brother     melanoma retina  . Heart disease Brother   . Stroke Maternal Grandmother   . Cancer Mother 39    Hodgkins   . Glaucoma Mother   . Colon cancer Neg Hx   . Colon polyps Neg Hx     ROS:  no fevers or chills, productive cough, hemoptysis, dysphasia, odynophagia, melena, hematochezia, dysuria, hematuria, rash, seizure activity, orthopnea, PND, pedal edema, claudication.  Remaining systems are negative.  Physical Exam:   Blood pressure (!) 149/69, pulse (!) 59, temperature 98.2 F (36.8 C), temperature source Oral, resp. rate 14, height _0  (1.753 m), weight 214 lb 8 oz (97.3 kg), SpO2 98 %.  General:  Well developed/well nourished in NAD Skin warm/dry Patient not depressed No peripheral clubbing Back-normal HEENT-normal/normal eyelids Neck supple/normal carotid upstroke bilaterally; no bruits; no JVD; no thyromegaly chest - CTA/ normal expansion CV - RRR/normal S1 and S2; no murmurs, rubs or gallops;  PMI nondisplaced Abdomen -NT/ND, no HSM, no mass, + bowel sounds, no bruit 2+ femoral pulses, no bruits Ext-no edema, chords, 2+ DP Neuro-grossly nonfocal  ECG -sinus bradycardia-right bundle branch block.  Results for orders placed or performed during the hospital encounter of 07/10/16 (from the past 48 hour(s))  CBC with Differential/Platelet     Status: None   Collection Time: 07/10/16  2:29 AM  Result Value Ref Range   WBC 8.0 4.0 - 10.5 K/uL   RBC 4.37 4.22 - 5.81 MIL/uL   Hemoglobin 13.7 13.0 - 17.0 g/dL   HCT 41.0 39.0 - 52.0 %   MCV 93.8 78.0 - 100.0 fL   MCH 31.4 26.0 - 34.0 pg   MCHC 33.4 30.0 - 36.0 g/dL   RDW 12.7 11.5 - 15.5 %   Platelets 193 150 - 400 K/uL   Neutrophils Relative % 59 %   Neutro Abs 4.7 1.7 - 7.7 K/uL   Lymphocytes Relative 30 %   Lymphs Abs 2.4 0.7 - 4.0 K/uL   Monocytes Relative 7 %   Monocytes Absolute 0.6 0.1 - 1.0 K/uL   Eosinophils Relative 4 %   Eosinophils Absolute 0.3 0.0 - 0.7 K/uL   Basophils Relative 0 %   Basophils Absolute 0.0 0.0 - 0.1 K/uL  Comprehensive metabolic panel     Status: Abnormal   Collection Time: 07/10/16  2:29 AM  Result Value Ref Range   Sodium 138 135 - 145 mmol/L   Potassium 4.1 3.5 - 5.1 mmol/L   Chloride 105 101 - 111 mmol/L   CO2 25 22 - 32 mmol/L   Glucose, Bld 143 (H) 65 - 99 mg/dL   BUN 26 (H) 6 - 20 mg/dL   Creatinine, Ser 1.34 (H) 0.61 - 1.24 mg/dL   Calcium  9.1 8.9 - 10.3 mg/dL   Total Protein 5.8 (L) 6.5 - 8.1 g/dL   Albumin 3.6 3.5 - 5.0 g/dL   AST 10 (L) 15 - 41 U/L   ALT 18 17 - 63 U/L   Alkaline Phosphatase 91 38 - 126 U/L   Total Bilirubin 0.8 0.3 -  1.2 mg/dL   GFR calc non Af Amer 52 (L) >60 mL/min   GFR calc Af Amer >60 >60 mL/min    Comment: (NOTE) The eGFR has been calculated using the CKD EPI equation. This calculation has not been validated in all clinical situations. eGFR's persistently <60 mL/min signify possible Chronic Kidney Disease.    Anion gap 8 5 - 15  Troponin I     Status: None   Collection Time: 07/10/16  2:29 AM  Result Value Ref Range   Troponin I <0.03 <0.03 ng/mL  Troponin I-serum (0, 3, 6 hours)     Status: None   Collection Time: 07/10/16  5:47 AM  Result Value Ref Range   Troponin I <0.03 <0.03 ng/mL  Lipid panel     Status: Abnormal   Collection Time: 07/10/16  5:47 AM  Result Value Ref Range   Cholesterol 196 0 - 200 mg/dL   Triglycerides 365 (H) <150 mg/dL   HDL 24 (L) >40 mg/dL   Total CHOL/HDL Ratio 8.2 RATIO   VLDL 73 (H) 0 - 40 mg/dL   LDL Cholesterol 99 0 - 99 mg/dL    Comment:        Total Cholesterol/HDL:CHD Risk Coronary Heart Disease Risk Table                     Men   Women  1/2 Average Risk   3.4   3.3  Average Risk       5.0   4.4  2 X Average Risk   9.6   7.1  3 X Average Risk  23.4   11.0        Use the calculated Patient Ratio above and the CHD Risk Table to determine the patient's CHD Risk.        ATP III CLASSIFICATION (LDL):  <100     mg/dL   Optimal  100-129  mg/dL   Near or Above                    Optimal  130-159  mg/dL   Borderline  160-189  mg/dL   High  >190     mg/dL   Very High     Dg Chest Portable 1 View  Result Date: 07/10/2016 CLINICAL DATA:  Chest pain and pressure.  Left arm pain. EXAM: PORTABLE CHEST 1 VIEW COMPARISON:  Chest CT 02/25/2016 FINDINGS: The cardiomediastinal contours are normal. The lungs are clear. Pulmonary vasculature is normal.  No consolidation, pleural effusion, or pneumothorax. Cortical thickening of the right fourth and left ninth ribs on a CT not well visualized radiographically. There is degenerative change in the spine. IMPRESSION: No acute abnormality. Electronically Signed   By: Jeb Levering M.D.   On: 07/10/2016 02:29    Assessment/Plan _0 @  Kirk Ruths MD 07/10/2016, 1:27 PM    Primary cardiologist:   HPI:   Medications Prior to Admission  Medication Sig Dispense Refill  . aspirin EC 325 MG tablet Take 650-975 mg by mouth once.      Allergies  Allergen Reactions  . Penicillins Swelling    As a child. Has patient had a PCN reaction causing immediate rash, facial/tongue/throat swelling, SOB or lightheadedness with hypotension: Yes Has patient had a PCN reaction causing severe rash involving mucus membranes or skin necrosis: No Has patient had a PCN reaction that required hospitalization: Was already in hospital when reaction happened Has patient had a PCN reaction occurring within the  last 10 years: No  If all of the above answers are "NO", then may proceed with Cephalosporin use.      Past Medical History:  Diagnosis Date  . Allergy   . Borderline diabetes   . Bronchitis    recurrent  . Fracture, finger    LEFT HAND WITH SWELLING AND BRUISING  . History of gout   . History of kidney stones   . History of skin cancer    possible - pt not sure of dx  . Prostate cancer ([place])   . Sinus infection    recurrent    Past Surgical History:  Procedure Laterality Date  . EYE SURGERY     B cataracts removed.  Marland Kitchen EYE SURGERY     lens implants  . LITHOTRIPSY    . LYMPHADENECTOMY Bilateral 04/05/2016   Procedure: LYMPHADENECTOMY, BILATERAL PELVIC LYMPHADENECTOMY;  Surgeon: Raynelle Bring, MD;  Location: [facility];  Service: Urology;  Laterality: Bilateral;  . PROSTATE BIOPSY    . ROBOT ASSISTED LAPAROSCOPIC RADICAL PROSTATECTOMY N/A 04/05/2016   Procedure: XI ROBOTIC ASSISTED  LAPAROSCOPIC RADICAL PROSTATECTOMY LEVEL 2;  Surgeon: Raynelle Bring, MD;  Location: [facility];  Service: Urology;  Laterality: N/A;  . TONSILLECTOMY AND ADENOIDECTOMY     also received radiation treatments, as was frequent in children at the time  . WISDOM TOOTH EXTRACTION      Social History   Social History  . Marital status: Married    Spouse name: [name]  . Number of children: 3  . Years of education: N/A   Occupational History  . 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 Smoking status: Never Smoker  . Smokeless tobacco: Never Used  . Alcohol use 1.2 oz/week    2 Standard drinks or equivalent per week     Comment: wine - red 2 glasses wine per week  . Drug use: No  . Sexual activity: Yes    Partners: Female   Other Topics Concern  . Not on file   Social History Narrative   Marital status: married x 25 years; second marriage; first marriage x 12 years.      Children: 3 daughters and one step-daughter; 4 grandchildren      Lives: with wife.      Employment: semi-retired; Company secretary; professor; Social worker, Probation officer.      Tobacco:  no      Alcohol: about 2 glasses of wine/week      Exercise: active in the yard.    Family History  Problem Relation Age of Onset  . Heart attack Father   . Heart disease Father 57    AMI as cause of death  . Diabetes Father   . Hyperlipidemia Father   . Parkinson's disease Brother   . Diabetes Brother   . Cancer Brother     melanoma retina  . Heart disease Brother   . Stroke Maternal Grandmother   . Cancer Mother 70    Hodgkins   . Glaucoma Mother   . Colon cancer Neg Hx   . Colon polyps Neg Hx     ROS:  no fevers or chills, productive cough, hemoptysis, dysphasia, odynophagia, melena, hematochezia, dysuria, hematuria, rash, seizure activity, orthopnea, PND, pedal edema, claudication. Remaining systems are negative.  Physical Exam:   Blood pressure (!) 149/69,  pulse (!) 59, temperature 98.2 F (36.8 C), temperature source Oral, resp. rate 14, height _0  (1.753 m),  weight 214 lb 8 oz (97.3 kg), SpO2 98 %.  General:  Well developed/well nourished in NAD Skin warm/dry Patient not depressed No peripheral clubbing Back-normal HEENT-normal/normal eyelids Neck supple/normal carotid upstroke bilaterally; no bruits; no JVD; no thyromegaly chest - CTA/ normal expansion CV - RRR/normal S1 and S2; no murmurs, rubs or gallops;  PMI nondisplaced Abdomen -NT/ND, no HSM, no mass, + bowel sounds, no bruit 2+ femoral pulses, no bruits Ext-no edema, chords, 2+ DP Neuro-grossly nonfocal  ECG   Results for orders placed or performed during the hospital encounter of 07/10/16 (from the past 48 hour(s))  CBC with Differential/Platelet     Status: None   Collection Time: 07/10/16  2:29 AM  Result Value Ref Range   WBC 8.0 4.0 - 10.5 K/uL   RBC 4.37 4.22 - 5.81 MIL/uL   Hemoglobin 13.7 13.0 - 17.0 g/dL   HCT 41.0 39.0 - 52.0 %   MCV 93.8 78.0 - 100.0 fL   MCH 31.4 26.0 - 34.0 pg   MCHC 33.4 30.0 - 36.0 g/dL   RDW 12.7 11.5 - 15.5 %   Platelets 193 150 - 400 K/uL   Neutrophils Relative % 59 %   Neutro Abs 4.7 1.7 - 7.7 K/uL   Lymphocytes Relative 30 %   Lymphs Abs 2.4 0.7 - 4.0 K/uL   Monocytes Relative 7 %   Monocytes Absolute 0.6 0.1 - 1.0 K/uL   Eosinophils Relative 4 %   Eosinophils Absolute 0.3 0.0 - 0.7 K/uL   Basophils Relative 0 %   Basophils Absolute 0.0 0.0 - 0.1 K/uL  Comprehensive metabolic panel     Status: Abnormal   Collection Time: 07/10/16  2:29 AM  Result Value Ref Range   Sodium 138 135 - 145 mmol/L   Potassium 4.1 3.5 - 5.1 mmol/L   Chloride 105 101 - 111 mmol/L   CO2 25 22 - 32 mmol/L   Glucose, Bld 143 (H) 65 - 99 mg/dL   BUN 26 (H) 6 - 20 mg/dL   Creatinine, Ser 1.34 (H) 0.61 - 1.24 mg/dL   Calcium 9.1 8.9 - 10.3 mg/dL   Total Protein 5.8 (L) 6.5 - 8.1 g/dL   Albumin 3.6 3.5 - 5.0 g/dL   AST 10 (L) 15 - 41 U/L   ALT 18  17 - 63 U/L   Alkaline Phosphatase 91 38 - 126 U/L   Total Bilirubin 0.8 0.3 - 1.2 mg/dL   GFR calc non Af Amer 52 (L) >60 mL/min   GFR calc Af Amer >60 >60 mL/min    Comment: (NOTE) The eGFR has been calculated using the CKD EPI equation. This calculation has not been validated in all clinical situations. eGFR's persistently <60 mL/min signify possible Chronic Kidney Disease.    Anion gap 8 5 - 15  Troponin I     Status: None   Collection Time: 07/10/16  2:29 AM  Result Value Ref Range   Troponin I <0.03 <0.03 ng/mL  Troponin I-serum (0, 3, 6 hours)     Status: None   Collection Time: 07/10/16  5:47 AM  Result Value Ref Range   Troponin I <0.03 <0.03 ng/mL  Lipid panel     Status: Abnormal   Collection Time: 07/10/16  5:47 AM  Result Value Ref Range   Cholesterol 196 0 - 200 mg/dL   Triglycerides 365 (H) <150 mg/dL   HDL 24 (L) >40 mg/dL   Total CHOL/HDL Ratio 8.2 RATIO   VLDL 73 (  H) 0 - 40 mg/dL   LDL Cholesterol 99 0 - 99 mg/dL    Comment:        Total Cholesterol/HDL:CHD Risk Coronary Heart Disease Risk Table                     Men   Women  1/2 Average Risk   3.4   3.3  Average Risk       5.0   4.4  2 X Average Risk   9.6   7.1  3 X Average Risk  23.4   11.0        Use the calculated Patient Ratio above and the CHD Risk Table to determine the patient's CHD Risk.        ATP III CLASSIFICATION (LDL):  <100     mg/dL   Optimal  100-129  mg/dL   Near or Above                    Optimal  130-159  mg/dL   Borderline  160-189  mg/dL   High  >190     mg/dL   Very High     Dg Chest Portable 1 View  Result Date: 07/10/2016 CLINICAL DATA:  Chest pain and pressure.  Left arm pain. EXAM: PORTABLE CHEST 1 VIEW COMPARISON:  Chest CT 02/25/2016 FINDINGS: The cardiomediastinal contours are normal. The lungs are clear. Pulmonary vasculature is normal. No consolidation, pleural effusion, or pneumothorax. Cortical thickening of the right fourth and left ninth ribs on a CT not  well visualized radiographically. There is degenerative change in the spine. IMPRESSION: No acute abnormality. Electronically Signed   By: Jeb Levering M.D.   On: 07/10/2016 02:29    Assessment/Plan 1 chest pain-symptoms with both typical and atypical features. His enzymes are negative. We will plan a stress nuclear study tomorrow for risk stratification. 2 hypertension-Amlodipine was added today for blood pressure control. Follow and adjust regimen as needed. 3 hyperlipidemia-agree with statin. 4 history of prostate cancer  Kirk Ruths MD 07/10/2016, 1:27 PM

## 2016-07-10 NOTE — Progress Notes (Signed)
[facility] PROGRESS NOTE    Progress Note  Lance Stevens  [id number] DOB: 11-17-1946 DOA: 07/10/2016 PCP: Harrison Mons, PA-C     Brief Narrative:   Lance Stevens is an 70 y.o. male past medical history of essential hypertension prediabetes comes in complaining of chest pain.  Assessment/Plan:   Chest pain: Heart Scores 5. 2set of cardiac biomarkers have been negative 2, his HDL is less than 24 his LDLs 100. EKG showed normal sinus rhythm right bundle branch block non-specific ST-T wave changes. No evidence on telemetry. Heart rate is around 60 so will not start beta blocker, aspirin and statins. Check A1c  Chronic kidney disease Stage III: Seems to be at baseline. Start Norvasc.  Essential hypertension: Blood pressure is high,   DVT prophylaxis: lovenox Family Communication:none Disposition Plan/Barrier to D/C: home in am Code Status:     Code Status Orders        Start     Ordered   07/10/16 0537  Full code  Continuous     07/10/16 0536    Code Status History    Date Active Date Inactive Code Status Order ID Comments User Context   This patient has a current code status but no historical code status.    Advance Directive Documentation   Flowsheet Row Most Recent Value  Type of Advance Directive  Healthcare Power of Attorney, Living will  Pre-existing out of facility DNR order (yellow form or pink MOST form)  No data  "MOST" Form in Place?  No data        IV Access:    Peripheral IV   Procedures and diagnostic studies:   Dg Chest Portable 1 View  Result Date: 07/10/2016 CLINICAL DATA:  Chest pain and pressure.  Left arm pain. EXAM: PORTABLE CHEST 1 VIEW COMPARISON:  Chest CT 02/25/2016 FINDINGS: The cardiomediastinal contours are normal. The lungs are clear. Pulmonary vasculature is normal. No consolidation, pleural effusion, or pneumothorax. Cortical thickening of the right fourth and left ninth ribs on a CT not well visualized  radiographically. There is degenerative change in the spine. IMPRESSION: No acute abnormality. Electronically Signed   By: Jeb Levering M.D.   On: 07/10/2016 02:29     Medical Consultants:    None.  Anti-Infectives:   none  Subjective:    Lance Stevens he relates no further chest pain or shortness of breath.  Objective:    Vitals:   07/10/16 0430 07/10/16 0445 07/10/16 0500 07/10/16 0531  BP: 140/56 167/79 163/76   Pulse: (!) 49 (!) 46 (!) 48   Resp: 11 19 14    Temp:      TempSrc:      SpO2: 100% 100% 99%   Weight:    97.3 kg (214 lb 8 oz)  Height:    5\' 9"  (1.753 m)   No intake or output data in the 24 hours ending 07/10/16 0825 Filed Weights   07/10/16 0207 07/10/16 0531  Weight: 93 kg (205 lb) 97.3 kg (214 lb 8 oz)    Exam: General exam: In no acute distress. Respiratory system: Good air movement and clear to auscultation. Cardiovascular system: S1 & S2 heard, RRR. No JVD, murmurs, rubs, gallops or clicks.  Gastrointestinal system: Abdomen is nondistended, soft and nontender.  Central nervous system: Alert and oriented. No focal neurological deficits. Extremities: No pedal edema. Skin: No rashes, lesions or ulcers Psychiatry: Judgement and insight appear normal. Mood & affect appropriate.    Data Reviewed:  Labs: Basic Metabolic Panel:  Recent Labs Lab 07/10/16 0229  NA 138  K 4.1  CL 105  CO2 25  GLUCOSE 143*  BUN 26*  CREATININE 1.34*  CALCIUM 9.1   GFR Estimated Creatinine Clearance: 59.8 mL/min (by C-G formula based on SCr of 1.34 mg/dL). Liver Function Tests:  Recent Labs Lab 07/10/16 0229  AST 10*  ALT 18  ALKPHOS 91  BILITOT 0.8  PROT 5.8*  ALBUMIN 3.6   No results for input(s): LIPASE, AMYLASE in the last 168 hours. No results for input(s): AMMONIA in the last 168 hours. Coagulation profile No results for input(s): INR, PROTIME in the last 168 hours.  CBC:  Recent Labs Lab 07/10/16 0229  WBC 8.0  NEUTROABS  4.7  HGB 13.7  HCT 41.0  MCV 93.8  PLT 193   Cardiac Enzymes:  Recent Labs Lab 07/10/16 0229 07/10/16 0547  TROPONINI <0.03 <0.03   BNP (last 3 results) No results for input(s): PROBNP in the last 8760 hours. CBG: No results for input(s): GLUCAP in the last 168 hours. D-Dimer: No results for input(s): DDIMER in the last 72 hours. Hgb A1c: No results for input(s): HGBA1C in the last 72 hours. Lipid Profile:  Recent Labs  07/10/16 0547  CHOL 196  HDL 24*  LDLCALC 99  TRIG 365*  CHOLHDL 8.2   Thyroid function studies: No results for input(s): TSH, T4TOTAL, T3FREE, THYROIDAB in the last 72 hours.  Invalid input(s): FREET3 Anemia work up: No results for input(s): VITAMINB12, FOLATE, FERRITIN, TIBC, IRON, RETICCTPCT in the last 72 hours. Sepsis Labs:  Recent Labs Lab 07/10/16 0229  WBC 8.0   Microbiology No results found for this or any previous visit (from the past 240 hour(s)).   Medications:   . enoxaparin (LOVENOX) injection  40 mg Subcutaneous Q24H  . pantoprazole  20 mg Oral Once   Continuous Infusions:   Time spent: 25 min   LOS: 0 days   [name]  [facility] Pager [phone]  *Please refer to [email or web address], password [email or web address] to get updated schedule on who will round on this patient, as hospitalists switch teams weekly. If 7PM-7AM, please contact night-coverage at [email or web address], password [email or web address] for any overnight needs.  07/10/2016, 8:25 AM

## 2016-07-10 NOTE — ED Triage Notes (Signed)
Pt to ED by EMS c/o L center chest pressure and tightness onset at 2300 associated with diaphoresis, nausea, and SOB. Pt took 324mg  x 5 ASA (total of 1650mg ) at home and nitro x 4 with EMS with improvement of pressure and diaphoresis. Initial bp 220/120, decreased to 151/86

## 2016-07-10 NOTE — ED Notes (Signed)
Pt c/o nausea; denies pressure or chest pain. Noted to be bradycardic in the mid-40s. Dr.Campos aware. Repeat ekg given to EDP. Pt appears uncomfortable

## 2016-07-10 NOTE — H&P (Signed)
History and Physical  Patient Name: Lance Stevens     [id number]    DOB: December 14, 1945    DOA: 07/10/2016 PCP: Harrison Mons, PA-C   Patient coming from: Home     Chief Complaint: Chest pain  HPI: Lance Stevens is a 70 y.o. male with a past medical history significant for ?HTN and ?pre-diabetes who presents with chest pain.  The patient was trying to go to bed last night when he had onset of dull central chest pressure mild to moderate intensity not radiating, associated with shortness of breath, nausea, and diaphoresis. He took three aspirin 325 mg, then two more because he was concerned about heart attack, then started to feel more nauseated and called 9-1-1.  En route, his pain improved with nitroglycerin.      ED course: -Afebrile, bradycardic to the 60s down to the 40s, respirations normal, hypertensive to 160/80, oxygen saturation normal -Initial ECG showed sinus bradycardia with old RBBB and troponin was negative. -Na 138, K 4.1, Cr 1.34 (baseline 1.0-1.7), WBC 8, Hgb 13.7 -TRH was asked to admit for observation, serial troponins and risk stratification.  He does not smoke.  His grandfather, father and brother all died from MI, in their 22s and 34s.  He was told he has "high sugar" before, but does not take medicine for diabetes, likewise he has "white coat hypertension" that is not treated.     Review of Systems:  Pt complains of chest pain, dyspepsia, diaphoresis, nausea. All other systems negative except as just noted or noted in the history of present illness.  Past Medical History:  Diagnosis Date  . Allergy   . Borderline diabetes   . Bronchitis    recurrent  . Fracture, finger    LEFT HAND WITH SWELLING AND BRUISING  . History of gout   . History of kidney stones   . History of skin cancer    possible - pt not sure of dx  . Prostate cancer ([place])   . Prostate cancer ([place])   . Sinus infection    recurrent    Past Surgical History:  Procedure Laterality  Date  . EYE SURGERY     B cataracts removed.  Marland Kitchen EYE SURGERY     lens implants  . LITHOTRIPSY    . LYMPHADENECTOMY Bilateral 04/05/2016   Procedure: LYMPHADENECTOMY, BILATERAL PELVIC LYMPHADENECTOMY;  Surgeon: Raynelle Bring, MD;  Location: [facility];  Service: Urology;  Laterality: Bilateral;  . PROSTATE BIOPSY    . ROBOT ASSISTED LAPAROSCOPIC RADICAL PROSTATECTOMY N/A 04/05/2016   Procedure: XI ROBOTIC ASSISTED LAPAROSCOPIC RADICAL PROSTATECTOMY LEVEL 2;  Surgeon: Raynelle Bring, MD;  Location: [facility];  Service: Urology;  Laterality: N/A;  . TONSILLECTOMY AND ADENOIDECTOMY     also received radiation treatments, as was frequent in children at the time  . WISDOM TOOTH EXTRACTION      Social History: Patient lives with his wife.  He is a former Company secretary, now works Warehouse manager as Press photographer.  Patient walks unassisted.  Non-smoker.  Rare alcohol.     Allergies  Allergen Reactions  . Penicillins Swelling    As a child. Has patient had a PCN reaction causing immediate rash, facial/tongue/throat swelling, SOB or lightheadedness with hypotension: Yes Has patient had a PCN reaction causing severe rash involving mucus membranes or skin necrosis: No Has patient had a PCN reaction that required hospitalization: Was already in hospital when reaction happened Has patient had a PCN reaction occurring within the last 10 years:  No  If all of the above answers are "NO", then may proceed with Cephalosporin use.     Family history: family history includes Cancer in his brother; Cancer (age of onset: 56) in his mother; Diabetes in his brother and father; Glaucoma in his mother; Heart attack in his father; Heart disease in his brother; Heart disease (age of onset: 55) in his father; Hyperlipidemia in his father; Parkinson's disease in his brother; Stroke in his maternal grandmother.  Prior to Admission medications   Medication Sig Start Date End Date Taking? Authorizing Provider  aspirin EC 325 MG tablet  Take 650-975 mg by mouth once.   Yes Historical Provider, MD       Physical Exam: BP 163/76   Pulse (!) 48   Temp 98.2 F (36.8 C) (Oral)   Resp 14   Ht (P) 5\' 9"  (1.753 m)   Wt (P) 93 kg (205 lb)   SpO2 99%   BMI (P) 30.27 kg/m  General appearance: Well-developed, adult male, alert and in no acute distress.   Eyes: Anicteric, conjunctiva pink, lids and lashes normal.     ENT: No nasal deformity, discharge, or epistaxis.  OP moist without lesions.   Skin: Warm and dry.   Cardiac: RRR, nl S1-S2, no murmurs appreciated.  Capillary refill is brisk.  JVP normal.  No LE edema.  Radial and DP pulses 2+ and symmetric.  No carotid bruits. Respiratory: Normal respiratory rate and rhythm.  CTAB without rales or wheezes. GI: Abdomen soft without rigidity.  No TTP. No ascites, distension.   MSK: No deformities or effusions.   Pain not reproduced with palpation of precordium.  No pain with arm movement. Neuro: Sensorium intact and responding to questions, attention normal.  Speech is fluent.  Moves all extremities equally and with normal coordination.    Psych: Behavior appropriate.  Affect normal.  No evidence of aural or visual hallucinations or delusions.       Labs on Admission:  The metabolic panel shows normal electrolytes, likely CKD. The complete blood count shows no leukocytosis, anemia, thrombocytopenia. The initial troponin is negative.  Radiological Exams on Admission: Personally reviewed: Dg Chest Portable 1 View  Result Date: 07/10/2016 CLINICAL DATA:  Chest pain and pressure.  Left arm pain. EXAM: PORTABLE CHEST 1 VIEW COMPARISON:  Chest CT 02/25/2016 FINDINGS: The cardiomediastinal contours are normal. The lungs are clear. Pulmonary vasculature is normal. No consolidation, pleural effusion, or pneumothorax. Cortical thickening of the right fourth and left ninth ribs on a CT not well visualized radiographically. There is degenerative change in the spine. IMPRESSION: No acute  abnormality. Electronically Signed   By: Jeb Levering M.D.   On: 07/10/2016 02:29    EKG: Independently reviewed. Rate 46 on second ECG, 61 on first, QTC normal, RBBB, old.    Assessment/Plan 1. Chest pain: This is new.  The patient has HEART score of 5. Angina is typical (pressure, relieved with nitroglycerin).  Other potential causes of chest pain (dissection, pancreatitis, pneumonia/effusion, pericarditis) are doubted.  No leg swelling, stasis or surgery, prostate cancer treatment is over, so without hypoxia or tachycardia, I do not suspect PE at this time. We have been asked to admit the patient for observation and etiology consultation with Cardiology tomorrow.  -Serial troponins are ordered -Telemetry -Consult to cardiology, appreciate recommendations -Check lipids and HgbA1c   2. Hypertension:  Monitor overnight.  3. Gastritis from Aspirin: -Pantoprazole oral -Maalox as needed    DVT prophylaxis: None, outpatient status  Diet: NPO after 4am for anticipated stress testing Code Status: Full  Family Communication: None present  Disposition Plan: Anticipate overnight observation for arrhythmia on telemetry, serial troponins and subsequent risk stratification by Cardiology.  If testing negative, home after. Consults called: None overnight Admission status: Telemetry, OBS   Medical decision making: Patient seen at 5:34 AM on 07/10/2016.  The patient was discussed with Dr. Venora Maples. What exists of the patient's chart was reviewed in depth.  Clinical condition: stable.      Edwin Dada [facility] Pager [phone]

## 2016-07-10 NOTE — Plan of Care (Signed)
Problem: Education: Goal: Knowledge of [facility] Education information/materials will improve Outcome: Progressing Pt educated on new medications, CHF, fluid restriction, daily weights. Pt has had no pain today. NPO after midnight for stress test in am. Pt refused to walk today, stated "maybe later", SCDS on. Troponin's neg x2. Pt has no further questions at this time. Will continue to monitor.

## 2016-07-10 NOTE — ED Provider Notes (Signed)
[facility] DEPT Provider Note   CSN: [id number] Arrival date & time: 07/10/16  [id number]  First Provider Contact:   First MD Initiated Contact with Patient 07/10/16 0158      By signing my name below, I, Maud Deed. Royston Sinner, attest that this documentation has been prepared under the direction and in the presence of Jola Schmidt, MD.  Electronically Signed: Maud Deed. Royston Sinner, ED Scribe. 07/10/16. 2:05 AM.    History   Chief Complaint Chief Complaint  Patient presents with  . Chest Pain   The history is provided by the patient. No language interpreter was used.    HPI Comments: Lance Stevens, brought in by EMS is a 70 y.o. male with a PMHx of borderline diabetes who presents to the Emergency Department complaining of constant, unchanged central chest pain onset 11:00 PM this evening while watching TV. Pain is described as pressure. However, pt states discomfort has significantly improved. No aggravating or alleviating factors at this time. Associated nausea, shortness of breath, and diaphoresis also reported.  5 doses of 325 mg ASA taken prior to EMS arrival. 1 dose of Nitro administered en route to department which did improved nausea, diaphoresis, and some of chest pressure. No recent fever, chills, or vomiting. No prior personal history of heart issues but reported a heavy family history of heart disease.  PCP: Harrison Mons, PA-C    Past Medical History:  Diagnosis Date  . Allergy   . Borderline diabetes   . Bronchitis    recurrent  . Fracture, finger    LEFT HAND WITH SWELLING AND BRUISING  . History of gout   . History of kidney stones   . History of skin cancer    possible - pt not sure of dx  . Prostate cancer ([place])   . Prostate cancer ([place])   . Sinus infection    recurrent    Patient Active Problem List   Diagnosis Date Noted  . Prostate cancer ([place]) 04/05/2016  . 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 Elevated PSA 12/11/2015  . Hypertriglyceridemia 12/11/2015  . Diabetes mellitus type 2, uncomplicated ([place]) 0000000  . Blood pressure elevated 05/27/2015  . Colon cancer screening 05/27/2015  . Need for shingles vaccine 05/27/2015  . Family history of Parkinson's disease 05/27/2015  . History of nephrolithiasis 05/27/2015    Past Surgical History:  Procedure Laterality Date  . EYE SURGERY     B cataracts removed.  Marland Kitchen EYE SURGERY     lens implants  . LITHOTRIPSY    . LYMPHADENECTOMY Bilateral 04/05/2016   Procedure: LYMPHADENECTOMY, BILATERAL PELVIC LYMPHADENECTOMY;  Surgeon: Raynelle Bring, MD;  Location: [facility];  Service: Urology;  Laterality: Bilateral;  . PROSTATE BIOPSY    . ROBOT ASSISTED LAPAROSCOPIC RADICAL PROSTATECTOMY N/A 04/05/2016   Procedure: XI ROBOTIC ASSISTED LAPAROSCOPIC RADICAL PROSTATECTOMY LEVEL 2;  Surgeon: Raynelle Bring, MD;  Location: [facility];  Service: Urology;  Laterality: N/A;  . TONSILLECTOMY AND ADENOIDECTOMY     also received radiation treatments, as was frequent in children at the time  . WISDOM TOOTH EXTRACTION         Home Medications    Prior to Admission medications   Medication Sig Start Date End Date Taking? Authorizing Provider  aspirin EC 325 MG tablet Take 650-975 mg by mouth once.   Yes Historical Provider, MD    Family History  Family History  Problem Relation Age of Onset  . Heart attack Father   . Heart disease Father 66    AMI as cause of death  . Diabetes Father   . Hyperlipidemia Father   . Parkinson's disease Brother   . Diabetes Brother   . Cancer Brother     melanoma retina  . Heart disease Brother   . Stroke Maternal Grandmother   . Cancer Mother 24    Hodgkins   . Glaucoma Mother   . Colon cancer Neg Hx   . Colon polyps Neg Hx     Social History Social History  Substance Use Topics  . Smoking status: Never Smoker  . Smokeless tobacco: Never Used  . Alcohol use 1.2 oz/week    2  Standard drinks or equivalent per week     Comment: wine - red 2 glasses wine per week     Allergies   Penicillins   Review of Systems Review of Systems  Constitutional: Positive for diaphoresis. Negative for chills and fever.  Respiratory: Positive for shortness of breath.   Cardiovascular: Positive for chest pain.  Gastrointestinal: Positive for nausea. Negative for vomiting.  Neurological: Negative for headaches.  Psychiatric/Behavioral: Negative for confusion.  All other systems reviewed and are negative.    Physical Exam Updated Vital Signs BP 169/73   Pulse (!) 48   Temp 98.2 F (36.8 C) (Oral)   Resp 13   Ht (P) 5\' 9"  (1.753 m)   Wt (P) 205 lb (93 kg)   SpO2 100%   BMI (P) 30.27 kg/m   Physical Exam  Constitutional: He is oriented to person, place, and time. He appears well-developed and well-nourished.  HENT:  Head: Normocephalic and atraumatic.  Eyes: EOM are normal.  Neck: Normal range of motion.  Cardiovascular: Normal rate, regular rhythm, normal heart sounds and intact distal pulses.   Pulmonary/Chest: Effort normal and breath sounds normal. No respiratory distress.  Abdominal: Soft. He exhibits no distension. There is no tenderness.  Musculoskeletal: Normal range of motion.  Neurological: He is alert and oriented to person, place, and time.  Skin: Skin is warm and dry.  Psychiatric: He has a normal mood and affect. Judgment normal.  Nursing note and vitals reviewed.    ED Treatments / Results   DIAGNOSTIC STUDIES: Oxygen Saturation is 100% on RA, Normal by my interpretation.    COORDINATION OF CARE: 2:05 AM- Will order EKG, blood work, and imaging. Discussed treatment plan with pt at bedside and pt agreed to plan.     Labs (all labs ordered are listed, but only abnormal results are displayed) Labs Reviewed  COMPREHENSIVE METABOLIC PANEL - Abnormal; Notable for the following:       Result Value   Glucose, Bld 143 (*)    BUN 26 (*)     Creatinine, Ser 1.34 (*)    Total Protein 5.8 (*)    AST 10 (*)    GFR calc non Af Amer 52 (*)    All other components within normal limits  CBC WITH DIFFERENTIAL/PLATELET  TROPONIN I    EKG  EKG Interpretation #1 Date/Time:  Saturday July 10 2016 03:16:56 EDT Ventricular Rate:  46 PR Interval:    QRS Duration: 143 QT Interval:  492 QTC Calculation: 431 R Axis:   0 Text Interpretation:  Sinus bradycardia Right bundle branch block No significant change was found Confirmed by Tobie Hellen  MD, Hersel Mcmeen ([id number]) on 07/10/2016 3:22:01 AM  ECG interpretation #2  Date: 07/10/2016  Rate: 46  Rhythm: normal sinus rhythm  QRS Axis: normal  Intervals: normal  ST/T Wave abnormalities: normal  Conduction Disutrbances: RBBB  Narrative Interpretation:   Old EKG Reviewed: No significant changes noted     Radiology Dg Chest Portable 1 View  Result Date: 07/10/2016 CLINICAL DATA:  Chest pain and pressure.  Left arm pain. EXAM: PORTABLE CHEST 1 VIEW COMPARISON:  Chest CT 02/25/2016 FINDINGS: The cardiomediastinal contours are normal. The lungs are clear. Pulmonary vasculature is normal. No consolidation, pleural effusion, or pneumothorax. Cortical thickening of the right fourth and left ninth ribs on a CT not well visualized radiographically. There is degenerative change in the spine. IMPRESSION: No acute abnormality. Electronically Signed   By: Jeb Levering M.D.   On: 07/10/2016 02:29    Procedures Procedures (including critical care time)  Medications Ordered in ED Medications  ondansetron (ZOFRAN) injection 4 mg (4 mg Intravenous Given 07/10/16 0317)     Initial Impression / Assessment and Plan / ED Course  I have reviewed the triage vital signs and the nursing notes.  Pertinent labs & imaging results that were available during my care of the patient were reviewed by me and considered in my medical decision making (see chart for details).  Clinical Course   Patient with  concerning chest pain story.  Chest pain-free on arrival to emergency department after aspirin at home as well as nitroglycerin by EMS.  EKG with right bundle branch block.  No significant changes EKG.  Initial troponin is negative.  Patient will be admitted to the hospital for ongoing chest pain rule out.  Final Clinical Impressions(s) / ED Diagnoses   Final diagnoses:  Chest pain, unspecified chest pain type    New Prescriptions New Prescriptions   No medications on file   I personally performed the services described in this documentation, which was scribed in my presence. The recorded information has been reviewed and is accurate.       Jola Schmidt, MD 07/10/16 0400

## 2016-07-11 ENCOUNTER — Observation Stay (HOSPITAL_BASED_OUTPATIENT_CLINIC_OR_DEPARTMENT_OTHER): Payer: Medicare Other

## 2016-07-11 ENCOUNTER — Observation Stay (HOSPITAL_COMMUNITY): Payer: Medicare Other

## 2016-07-11 DIAGNOSIS — R079 Chest pain, unspecified: Secondary | ICD-10-CM | POA: Diagnosis not present

## 2016-07-11 DIAGNOSIS — R072 Precordial pain: Secondary | ICD-10-CM | POA: Diagnosis not present

## 2016-07-11 DIAGNOSIS — N179 Acute kidney failure, unspecified: Secondary | ICD-10-CM | POA: Diagnosis not present

## 2016-07-11 LAB — NM MYOCAR MULTI W/SPECT W/WALL MOTION / EF
CHL CUP MPHR: 151 {beats}/min
CHL CUP RESTING HR STRESS: 80 {beats}/min
CSEPED: 0 min
CSEPPHR: 93 {beats}/min
Estimated workload: 1 METS
Exercise duration (sec): 0 s
Percent HR: 61 %

## 2016-07-11 LAB — PROTIME-INR
INR: 1.04
Prothrombin Time: 13.6 seconds (ref 11.4–15.2)

## 2016-07-11 MED ORDER — SODIUM CHLORIDE 0.9% FLUSH: 3.0000 mL | INTRAVENOUS | Status: DC | PRN

## 2016-07-11 MED ORDER — REGADENOSON 0.4 MG/5ML IV SOLN
0.4000 mg | Freq: Once | INTRAVENOUS | Status: DC
  Filled 2016-07-11: qty 5

## 2016-07-11 MED ORDER — TECHNETIUM TC 99M TETROFOSMIN IV KIT
30.0000 | PACK | Freq: Once | INTRAVENOUS | Status: AC | PRN
  Administered 2016-07-11: 30 via INTRAVENOUS

## 2016-07-11 MED ORDER — TECHNETIUM TC 99M TETROFOSMIN IV KIT
10.0000 | PACK | Freq: Once | INTRAVENOUS | Status: AC | PRN
  Administered 2016-07-11: 10 via INTRAVENOUS

## 2016-07-11 MED ORDER — SODIUM CHLORIDE 0.9 % WEIGHT BASED INFUSION
3.0000 mL/kg/h | INTRAVENOUS | Status: DC
  Administered 2016-07-12: 3 mL/kg/h via INTRAVENOUS

## 2016-07-11 MED ORDER — SODIUM CHLORIDE 0.9 % WEIGHT BASED INFUSION: 1.0000 mL/kg/h | INTRAVENOUS | Status: DC

## 2016-07-11 MED ORDER — SODIUM CHLORIDE 0.9% FLUSH
3.0000 mL | Freq: Two times a day (BID) | INTRAVENOUS | Status: DC
  Administered 2016-07-11: 3 mL via INTRAVENOUS

## 2016-07-11 MED ORDER — SODIUM CHLORIDE 0.9 % IV SOLN: 250.0000 mL | INTRAVENOUS | Status: DC | PRN

## 2016-07-11 MED ORDER — ASPIRIN 81 MG PO CHEW
81.0000 mg | CHEWABLE_TABLET | ORAL | Status: AC
  Administered 2016-07-12: 81 mg via ORAL
  Filled 2016-07-11: qty 1

## 2016-07-11 MED ORDER — REGADENOSON 0.4 MG/5ML IV SOLN
INTRAVENOUS | Status: AC
  Filled 2016-07-11: qty 5

## 2016-07-11 NOTE — Progress Notes (Signed)
    Subjective:  Denies CP or dyspnea   Objective:  Vitals:   07/10/16 1300 07/10/16 1406 07/10/16 2100 07/11/16 0427  BP:  (!) 166/81 128/83 133/86  Pulse: 67 65 69 61  Resp: 18  16 15   Temp: 98.2 F (36.8 C)  98.1 F (36.7 C) 98.3 F (36.8 C)  TempSrc: Oral  Oral Oral  SpO2: 98% 95% 97% 94%  Weight:    206 lb 6.4 oz (93.6 kg)  Height:        Intake/Output from previous day:  Intake/Output Summary (Last 24 hours) at 07/11/16 0726 Last data filed at 07/11/16 0424  Gross per 24 hour  Intake              480 ml  Output             1475 ml  Net             -995 ml    Physical Exam: Physical exam: Well-developed well-nourished in no acute distress.  Skin is warm and dry.  HEENT is normal.  Neck is supple.  Chest is clear to auscultation with normal expansion.  Cardiovascular exam is regular rate and rhythm.  Abdominal exam nontender or distended. No masses palpated. Extremities show no edema. neuro grossly intact    Lab Results: Basic Metabolic Panel:  Recent Labs  07/10/16 0229  NA 138  K 4.1  CL 105  CO2 25  GLUCOSE 143*  BUN 26*  CREATININE 1.34*  CALCIUM 9.1   CBC:  Recent Labs  07/10/16 0229  WBC 8.0  NEUTROABS 4.7  HGB 13.7  HCT 41.0  MCV 93.8  PLT 193   Cardiac Enzymes:  Recent Labs  07/10/16 0229 07/10/16 0547  TROPONINI <0.03 <0.03     Assessment/Plan:  1 chest pain-symptoms with both typical and atypical features. His enzymes are negative. We will plan a stress nuclear study today for risk stratification. 2 hypertension-Amlodipine was added for blood pressure control. Follow and adjust regimen as needed. 3 hyperlipidemia-agree with statin. 4 history of prostate cancer If nuclear study negative, pt can be DCed from a cardiac standpoint Kirk Ruths 07/11/2016, 7:26 AM

## 2016-07-11 NOTE — Progress Notes (Signed)
I reviewed stress test personally. Equivocal results.  ECG with exertional ST depression. However nuclear images show only diaphragmatic attenuation and no ischemia.   I also reviewed his chest CT from 3/17 which showed 3v coronary calcifications.   I discussed these results with patient and his wife.  Given nature of his CP, positive exercise ECG, coronary calcifications on chest CT and strong FHx will proceed with cath tomorrow.   I have reviewed the risks, indications, and alternatives to angioplasty and stenting with the patient. Risks include but are not limited to bleeding, infection, vascular injury, stroke, myocardial infarction, arrhythmia, kidney injury, radiation-related injury in the case of prolonged fluoroscopy use, emergency cardiac surgery, and death. The patient understands the risks of serious complication is low ([id number]) and he agrees to proceed.   Eldwin Volkov,MD 3:52 PM

## 2016-07-11 NOTE — Progress Notes (Signed)
1 day lexiscan myoview completed without significant complication, initially ordered as treadmill myoview, unfortunately patient could not achieve target HR due to fatigue, therefore test changed to [place]. Pending final result by [facility] radiology.    Hilbert Corrigan PA Pager: [phone]

## 2016-07-11 NOTE — Progress Notes (Addendum)
[facility] PROGRESS NOTE    Progress Note  Lance Stevens  [id number] DOB: 07/27/1946 DOA: 07/10/2016 PCP: Harrison Mons, PA-C     Brief Narrative:   Lance Stevens is an 70 y.o. male past medical history of essential hypertension prediabetes comes in complaining of chest pain.  Assessment/Plan:   Chest pain: No evidence of arrhythmia on telemetry. Cont  aspirin and statins. For stress test today.  Chronic kidney disease Stage III: Seems to be at baseline. Start Norvasc.  Essential hypertension: At goal, on Norvasc.  DVT prophylaxis: lovenox Family Communication:none Disposition Plan/Barrier to D/C: Unable to determined. Code Status:     Code Status Orders        Start     Ordered   07/10/16 0537  Full code  Continuous     07/10/16 0536    Code Status History    Date Active Date Inactive Code Status Order ID Comments User Context   This patient has a current code status but no historical code status.    Advance Directive Documentation   Flowsheet Row Most Recent Value  Type of Advance Directive  Healthcare Power of Attorney, Living will  Pre-existing out of facility DNR order (yellow form or pink MOST form)  No data  "MOST" Form in Place?  No data        IV Access:    Peripheral IV   Procedures and diagnostic studies:   Dg Chest Portable 1 View  Result Date: 07/10/2016 CLINICAL DATA:  Chest pain and pressure.  Left arm pain. EXAM: PORTABLE CHEST 1 VIEW COMPARISON:  Chest CT 02/25/2016 FINDINGS: The cardiomediastinal contours are normal. The lungs are clear. Pulmonary vasculature is normal. No consolidation, pleural effusion, or pneumothorax. Cortical thickening of the right fourth and left ninth ribs on a CT not well visualized radiographically. There is degenerative change in the spine. IMPRESSION: No acute abnormality. Electronically Signed   By: Jeb Levering M.D.   On: 07/10/2016 02:29     Medical Consultants:     None.  Anti-Infectives:   none  Subjective:    Lance Stevens he relates no further chest pain or shortness of breath.  Objective:    Vitals:   07/10/16 1406 07/10/16 2100 07/11/16 0427 07/11/16 0741  BP: (!) 166/81 128/83 133/86 125/75  Pulse: 65 69 61   Resp:  16 15   Temp:  98.1 F (36.7 C) 98.3 F (36.8 C)   TempSrc:  Oral Oral   SpO2: 95% 97% 94%   Weight:   93.6 kg (206 lb 6.4 oz)   Height:        Intake/Output Summary (Last 24 hours) at 07/11/16 0926 Last data filed at 07/11/16 0900  Gross per 24 hour  Intake              480 ml  Output             1650 ml  Net            -1170 ml   Filed Weights   07/10/16 0207 07/10/16 0531 07/11/16 0427  Weight: 93 kg (205 lb) 97.3 kg (214 lb 8 oz) 93.6 kg (206 lb 6.4 oz)    Exam: General exam: In no acute distress. Respiratory system: Good air movement and clear to auscultation. Cardiovascular system: S1 & S2 heard, RRR.  Gastrointestinal system: Abdomen is nondistended, soft and nontender.  Central nervous system: Alert and oriented. No focal neurological deficits. Extremities: No pedal edema.  Skin: No rashes, lesions or ulcers Psychiatry: Judgement and insight appear normal. Mood & affect appropriate.    Data Reviewed:    Labs: Basic Metabolic Panel:  Recent Labs Lab 07/10/16 0229  NA 138  K 4.1  CL 105  CO2 25  GLUCOSE 143*  BUN 26*  CREATININE 1.34*  CALCIUM 9.1   GFR Estimated Creatinine Clearance: 58.8 mL/min (by C-G formula based on SCr of 1.34 mg/dL). Liver Function Tests:  Recent Labs Lab 07/10/16 0229  AST 10*  ALT 18  ALKPHOS 91  BILITOT 0.8  PROT 5.8*  ALBUMIN 3.6   No results for input(s): LIPASE, AMYLASE in the last 168 hours. No results for input(s): AMMONIA in the last 168 hours. Coagulation profile No results for input(s): INR, PROTIME in the last 168 hours.  CBC:  Recent Labs Lab 07/10/16 0229  WBC 8.0  NEUTROABS 4.7  HGB 13.7  HCT 41.0  MCV 93.8  PLT  193   Cardiac Enzymes:  Recent Labs Lab 07/10/16 0229 07/10/16 0547  TROPONINI <0.03 <0.03   BNP (last 3 results) No results for input(s): PROBNP in the last 8760 hours. CBG: No results for input(s): GLUCAP in the last 168 hours. D-Dimer: No results for input(s): DDIMER in the last 72 hours. Hgb A1c: No results for input(s): HGBA1C in the last 72 hours. Lipid Profile:  Recent Labs  07/10/16 0547  CHOL 196  HDL 24*  LDLCALC 99  TRIG 365*  CHOLHDL 8.2   Thyroid function studies: No results for input(s): TSH, T4TOTAL, T3FREE, THYROIDAB in the last 72 hours.  Invalid input(s): FREET3 Anemia work up: No results for input(s): VITAMINB12, FOLATE, FERRITIN, TIBC, IRON, RETICCTPCT in the last 72 hours. Sepsis Labs:  Recent Labs Lab 07/10/16 0229  WBC 8.0   Microbiology No results found for this or any previous visit (from the past 240 hour(s)).   Medications:   . regadenoson      . amLODipine  5 mg Oral Daily  . aspirin EC  81 mg Oral Daily  . atorvastatin  40 mg Oral q1800  . enoxaparin (LOVENOX) injection  40 mg Subcutaneous Q24H   Continuous Infusions:   Time spent: 15 min   LOS: 0 days   Lance Stevens  [facility] Pager [phone]  *Please refer to [email or web address], password [email or web address] to get updated schedule on who will round on this patient, as hospitalists switch teams weekly. If 7PM-7AM, please contact night-coverage at [email or web address], password [email or web address] for any overnight needs.  07/11/2016, 9:26 AM

## 2016-07-11 NOTE — Plan of Care (Signed)
Problem: Consults Goal: Cardiac Cath Patient Education (See Patient Education module for education specifics.) Outcome: Progressing Patient educated on cardiac cath with possible percutaneous coronary intervention due to calcifications and positive ECG.  Patient has no further questions at this time. Consent obtained.

## 2016-07-12 ENCOUNTER — Encounter (HOSPITAL_COMMUNITY)
Admission: EM | Disposition: A | Discharge status: Home / Self Care | Payer: Self-pay | Source: Home / Self Care | Attending: Emergency Medicine

## 2016-07-12 ENCOUNTER — Encounter (HOSPITAL_COMMUNITY): Payer: Self-pay | Admitting: Cardiology

## 2016-07-12 DIAGNOSIS — N183 Chronic kidney disease, stage 3 (moderate): Secondary | ICD-10-CM | POA: Diagnosis not present

## 2016-07-12 DIAGNOSIS — I10 Essential (primary) hypertension: Secondary | ICD-10-CM | POA: Diagnosis not present

## 2016-07-12 DIAGNOSIS — N179 Acute kidney failure, unspecified: Secondary | ICD-10-CM | POA: Diagnosis not present

## 2016-07-12 DIAGNOSIS — R079 Chest pain, unspecified: Secondary | ICD-10-CM | POA: Diagnosis not present

## 2016-07-12 DIAGNOSIS — I209 Angina pectoris, unspecified: Secondary | ICD-10-CM

## 2016-07-12 DIAGNOSIS — N182 Chronic kidney disease, stage 2 (mild): Secondary | ICD-10-CM

## 2016-07-12 DIAGNOSIS — I2510 Atherosclerotic heart disease of native coronary artery without angina pectoris: Secondary | ICD-10-CM

## 2016-07-12 DIAGNOSIS — E785 Hyperlipidemia, unspecified: Secondary | ICD-10-CM

## 2016-07-12 HISTORY — PX: CARDIAC CATHETERIZATION: SHX172

## 2016-07-12 LAB — HEMOGLOBIN A1C
HEMOGLOBIN A1C: 6.8 % — AB (ref 4.8–5.6)
MEAN PLASMA GLUCOSE: 148 mg/dL

## 2016-07-12 SURGERY — LEFT HEART CATH AND CORONARY ANGIOGRAPHY
Anesthesia: LOCAL

## 2016-07-12 MED ORDER — SODIUM CHLORIDE 0.9% FLUSH: 3.0000 mL | Freq: Two times a day (BID) | INTRAVENOUS | Status: DC

## 2016-07-12 MED ORDER — LIDOCAINE HCL (PF) 1 % IJ SOLN
INTRAMUSCULAR | Status: DC | PRN
  Administered 2016-07-12: 3 mL

## 2016-07-12 MED ORDER — HEPARIN (PORCINE) IN NACL 2-0.9 UNIT/ML-% IJ SOLN
INTRAMUSCULAR | Status: AC
  Filled 2016-07-12: qty 1500

## 2016-07-12 MED ORDER — METOPROLOL SUCCINATE ER 25 MG PO TB24: 25.0000 mg | ORAL_TABLET | Freq: Every day | ORAL | Status: DC

## 2016-07-12 MED ORDER — LIDOCAINE HCL (PF) 1 % IJ SOLN
INTRAMUSCULAR | Status: AC
  Filled 2016-07-12: qty 30

## 2016-07-12 MED ORDER — FENTANYL CITRATE (PF) 100 MCG/2ML IJ SOLN
INTRAMUSCULAR | Status: AC
  Filled 2016-07-12: qty 2

## 2016-07-12 MED ORDER — SODIUM CHLORIDE 0.9% FLUSH: 3.0000 mL | INTRAVENOUS | Status: DC | PRN

## 2016-07-12 MED ORDER — HEPARIN SODIUM (PORCINE) 1000 UNIT/ML IJ SOLN
INTRAMUSCULAR | Status: DC | PRN
  Administered 2016-07-12: 5000 [IU] via INTRAVENOUS

## 2016-07-12 MED ORDER — ATORVASTATIN CALCIUM 40 MG PO TABS: 40.0000 mg | ORAL_TABLET | Freq: Every day | ORAL | 0 refills | Status: DC

## 2016-07-12 MED ORDER — IOPAMIDOL (ISOVUE-370) INJECTION 76%
INTRAVENOUS | Status: AC
  Filled 2016-07-12: qty 100

## 2016-07-12 MED ORDER — MIDAZOLAM HCL 2 MG/2ML IJ SOLN
INTRAMUSCULAR | Status: DC | PRN
  Administered 2016-07-12: 1 mg via INTRAVENOUS

## 2016-07-12 MED ORDER — IOPAMIDOL (ISOVUE-370) INJECTION 76%
INTRAVENOUS | Status: DC | PRN
  Administered 2016-07-12: 75 mL via INTRAVENOUS

## 2016-07-12 MED ORDER — SODIUM CHLORIDE 0.9 % WEIGHT BASED INFUSION: 1.0000 mL/kg/h | INTRAVENOUS | Status: AC

## 2016-07-12 MED ORDER — ASPIRIN 81 MG PO TBEC: 81.0000 mg | DELAYED_RELEASE_TABLET | Freq: Every day | ORAL | Status: DC

## 2016-07-12 MED ORDER — HEPARIN (PORCINE) IN NACL 2-0.9 UNIT/ML-% IJ SOLN
INTRAMUSCULAR | Status: DC | PRN
  Administered 2016-07-12: 1500 mL

## 2016-07-12 MED ORDER — METOPROLOL SUCCINATE ER 25 MG PO TB24: 12.5000 mg | ORAL_TABLET | Freq: Every day | ORAL | 0 refills | Status: DC

## 2016-07-12 MED ORDER — AMLODIPINE BESYLATE 5 MG PO TABS: 5.0000 mg | ORAL_TABLET | Freq: Every day | ORAL | 0 refills | Status: DC

## 2016-07-12 MED ORDER — FENTANYL CITRATE (PF) 100 MCG/2ML IJ SOLN
INTRAMUSCULAR | Status: DC | PRN
  Administered 2016-07-12: 25 ug via INTRAVENOUS

## 2016-07-12 MED ORDER — VERAPAMIL HCL 2.5 MG/ML IV SOLN
INTRAVENOUS | Status: AC
  Filled 2016-07-12: qty 2

## 2016-07-12 MED ORDER — MIDAZOLAM HCL 2 MG/2ML IJ SOLN
INTRAMUSCULAR | Status: AC
  Filled 2016-07-12: qty 2

## 2016-07-12 MED ORDER — SODIUM CHLORIDE 0.9 % IV SOLN: 250.0000 mL | INTRAVENOUS | Status: DC | PRN

## 2016-07-12 MED ORDER — HEPARIN SODIUM (PORCINE) 1000 UNIT/ML IJ SOLN
INTRAMUSCULAR | Status: AC
  Filled 2016-07-12: qty 1

## 2016-07-12 SURGICAL SUPPLY — 11 items
CATH INFINITI 5 FR JL3.5 (CATHETERS) ×1 IMPLANT
CATH INFINITI 5FR ANG PIGTAIL (CATHETERS) ×1 IMPLANT
CATH INFINITI JR4 5F (CATHETERS) ×1 IMPLANT
DEVICE RAD COMP TR BAND LRG (VASCULAR PRODUCTS) ×1 IMPLANT
GLIDESHEATH SLEND SS 6F .021 (SHEATH) ×1 IMPLANT
KIT HEART LEFT (KITS) ×2 IMPLANT
PACK CARDIAC CATHETERIZATION (CUSTOM PROCEDURE TRAY) ×2 IMPLANT
SYR MEDRAD MARK V 150ML (SYRINGE) ×2 IMPLANT
TRANSDUCER W/STOPCOCK (MISCELLANEOUS) ×2 IMPLANT
TUBING CIL FLEX 10 FLL-RA (TUBING) ×2 IMPLANT
WIRE SAFE-T 1.5MM-J .035X260CM (WIRE) ×1 IMPLANT

## 2016-07-12 NOTE — Discharge Summary (Signed)
Physician Discharge Summary  Lance Stevens [id number] DOB: [birth year] DOA: 07/10/2016  PCP: Harrison Mons, PA-C  Admit date: 07/10/2016 Discharge date: 07/12/2016  Admitted From:home   Disposition:  home   Recommendations for Outpatient Follow-up:  1. F/u with Dr Stanford Breed in 2-3 wks  Home Health:  none  Equipment/Devices:  none    Discharge Condition:  stable   CODE STATUS:  Full code   Diet recommendation:  Heart healthy Consultations:  cardiology    Discharge Diagnoses:  Principal Problem:   Pain in the chest Active Problems:   CKD (chronic kidney disease) stage 3, GFR 30-59 ml/min   Dyslipidemia    Subjective: No further chest pain or discomfort since admission.   Brief Summary: Lance Stevens is an 70 y.o. male past medical history of essential hypertension prediabetes comes in complaining of chest pain.  Hospital Course:  Chest pain: No evidence of arrhythmia on telemetry. Cont  aspirin and statins. For stress test showed a fixed defect and ST depressions on exertion. CT on 3/17 showed 3 V coronary calcifications - decision was made to proceed with cath- cath shows  CAD- per notes "signif CAD in mid and distal RCA/PLA, ostial and prox LAD with nl LV fxn"- recommendation is medical management - will prescribe Statin and Toprol - cont Aspirin and Norvasc  Dyslipidemia Lipid Panel - Lipitor ordered    Component Value Date/Time   CHOL 196 07/10/2016 0547   TRIG 365 (H) 07/10/2016 0547   HDL 24 (L) 07/10/2016 0547   CHOLHDL 8.2 07/10/2016 0547   VLDL 73 (H) 07/10/2016 0547   LDLCALC 99 07/10/2016 0547    Chronic kidney disease Stage III: Seems to be at baseline. Start Norvasc.  Essential hypertension: At goal, on Norvasc and Toprol   Discharge Instructions  Discharge Instructions    Diet - low sodium heart healthy    Complete by:  As directed   Increase activity slowly    Complete by:  As directed       Medication List    TAKE these  medications   amLODipine 5 MG tablet Commonly known as:  NORVASC Take 1 tablet (5 mg total) by mouth daily.   aspirin 81 MG EC tablet Take 1 tablet (81 mg total) by mouth daily. What changed:  medication strength  how much to take  when to take this   atorvastatin 40 MG tablet Commonly known as:  LIPITOR Take 1 tablet (40 mg total) by mouth daily at 6 PM.   metoprolol succinate 25 MG 24 hr tablet Commonly known as:  TOPROL-XL Take 0.5 tablets (12.5 mg total) by mouth daily.       Allergies  Allergen Reactions  . Penicillins Swelling    As a child. Has patient had a PCN reaction causing immediate rash, facial/tongue/throat swelling, SOB or lightheadedness with hypotension: Yes Has patient had a PCN reaction causing severe rash involving mucus membranes or skin necrosis: No Has patient had a PCN reaction that required hospitalization: Was already in hospital when reaction happened Has patient had a PCN reaction occurring within the last 10 years: No  If all of the above answers are "NO", then may proceed with Cephalosporin use.      Procedures/Studies:  Nm Myocar Multi W/spect W/wall Motion / Ef  Result Date: 07/11/2016 CLINICAL DATA:  70 year old male with new onset chest pressure EXAM: MYOCARDIAL IMAGING WITH SPECT (REST AND PHARMACOLOGIC-STRESS) GATED LEFT VENTRICULAR WALL MOTION STUDY LEFT VENTRICULAR EJECTION FRACTION TECHNIQUE: Standard myocardial  SPECT imaging was performed after resting intravenous injection of 10 mCi Tc-19m tetrofosmin. Subsequently, intravenous infusion of Lexiscan was performed under the supervision of the Cardiology staff. At peak effect of the drug, 30 mCi Tc-33m tetrofosmin was injected intravenously and standard myocardial SPECT imaging was performed. Quantitative gated imaging was also performed to evaluate left ventricular wall motion, and estimate left ventricular ejection fraction. COMPARISON:  Chest x-ray 07/10/2016 FINDINGS: Perfusion: No  decreased activity in the left ventricle on stress imaging to suggest reversible ischemia. There is a region of mildly attenuated radiotracer activity in the inferolateral wall extending to the true apex both at rest and during stress suggesting a region of prior infarct or scarring. Diaphragmatic attenuation artifact is also possible. Wall Motion: Normal left ventricular wall motion. No left ventricular dilation. Left Ventricular Ejection Fraction: 57 % End diastolic volume 47 ml End systolic volume 20 ml IMPRESSION: 1. No evidence of reversible ischemia. Moderate region of fixed defect in the inferolateral wall extending to the true apex suggests an area of prior infarct or scarring versus diaphragmatic attenuation artifact. 2. Normal left ventricular wall motion. 3. Left ventricular ejection fraction 57% 4. Non invasive risk stratification*: Low *2012 Appropriate Use Criteria for Coronary Revascularization Focused Update: J Am Coll Cardiol. N6492421. http://content.[email or web address].aspx?articleid=[email or web address] Electronically Signed   By: Jacqulynn Cadet M.D.   On: 07/11/2016 12:37   Dg Chest Portable 1 View  Result Date: 07/10/2016 CLINICAL DATA:  Chest pain and pressure.  Left arm pain. EXAM: PORTABLE CHEST 1 VIEW COMPARISON:  Chest CT 02/25/2016 FINDINGS: The cardiomediastinal contours are normal. The lungs are clear. Pulmonary vasculature is normal. No consolidation, pleural effusion, or pneumothorax. Cortical thickening of the right fourth and left ninth ribs on a CT not well visualized radiographically. There is degenerative change in the spine. IMPRESSION: No acute abnormality. Electronically Signed   By: Jeb Levering M.D.   On: 07/10/2016 02:29       Discharge Exam: Vitals:   07/12/16 1349 07/12/16 1412  BP: 139/74 128/76  Pulse: 61 64  Resp: 16 14  Temp:  98.3 F (36.8 C)   Vitals:   07/12/16 1340 07/12/16 1345 07/12/16 1349 07/12/16 1412  BP: 135/78 138/76 139/74 128/76   Pulse: 64 (!) 55 61 64  Resp: 16 18 16 14   Temp:    98.3 F (36.8 C)  TempSrc:    Oral  SpO2: 100% 100% 100% 100%  Weight:      Height:        General: Pt is alert, awake, not in acute distress Cardiovascular: RRR, S1/S2 +, no rubs, no gallops Respiratory: CTA bilaterally, no wheezing, no rhonchi Abdominal: Soft, NT, ND, bowel sounds + Extremities: no edema, no cyanosis    The results of significant diagnostics from this hospitalization (including imaging, microbiology, ancillary and laboratory) are listed below for reference.     Microbiology: No results found for this or any previous visit (from the past 240 hour(s)).   Labs: BNP (last 3 results) No results for input(s): BNP in the last 8760 hours. Basic Metabolic Panel:  Recent Labs Lab 07/10/16 0229  NA 138  K 4.1  CL 105  CO2 25  GLUCOSE 143*  BUN 26*  CREATININE 1.34*  CALCIUM 9.1   Liver Function Tests:  Recent Labs Lab 07/10/16 0229  AST 10*  ALT 18  ALKPHOS 91  BILITOT 0.8  PROT 5.8*  ALBUMIN 3.6   No results for input(s): LIPASE, AMYLASE in the last 168 hours. No results  for input(s): AMMONIA in the last 168 hours. CBC:  Recent Labs Lab 07/10/16 0229  WBC 8.0  NEUTROABS 4.7  HGB 13.7  HCT 41.0  MCV 93.8  PLT 193   Cardiac Enzymes:  Recent Labs Lab 07/10/16 0229 07/10/16 0547  TROPONINI <0.03 <0.03   BNP: Invalid input(s): POCBNP CBG: No results for input(s): GLUCAP in the last 168 hours. D-Dimer No results for input(s): DDIMER in the last 72 hours. Hgb A1c  Recent Labs  07/10/16 0547  HGBA1C 6.8*   Lipid Profile  Recent Labs  07/10/16 0547  CHOL 196  HDL 24*  LDLCALC 99  TRIG 365*  CHOLHDL 8.2   Thyroid function studies No results for input(s): TSH, T4TOTAL, T3FREE, THYROIDAB in the last 72 hours.  Invalid input(s): FREET3 Anemia work up No results for input(s): VITAMINB12, FOLATE, FERRITIN, TIBC, IRON, RETICCTPCT in the last 72 hours. Urinalysis     Component Value Date/Time   COLORURINE YELLOW 08/20/2007 [place] 08/20/2007 1704   LABSPEC 1.019 08/20/2007 1704   PHURINE 5.5 08/20/2007 1704   GLUCOSEU NEGATIVE 08/20/2007 1704   HGBUR LARGE (A) 08/20/2007 1704   BILIRUBINUR negative 12/09/2015 1228   BILIRUBINUR neg 05/07/2015 1207   KETONESUR negative 12/09/2015 1228   KETONESUR NEGATIVE 08/20/2007 1704   PROTEINUR negative 12/09/2015 1228   PROTEINUR neg 05/07/2015 1207   PROTEINUR NEGATIVE 08/20/2007 1704   UROBILINOGEN 0.2 12/09/2015 1228   UROBILINOGEN 0.2 08/20/2007 1704   NITRITE Negative 12/09/2015 1228   NITRITE neg 05/07/2015 1207   NITRITE NEGATIVE 08/20/2007 1704   LEUKOCYTESUR Negative 12/09/2015 1228   Sepsis Labs Invalid input(s): PROCALCITONIN,  WBC,  LACTICIDVEN Microbiology No results found for this or any previous visit (from the past 240 hour(s)).   Time coordinating discharge: Over 30 minutes  SIGNED:   Debbe Odea, MD  [facility] 07/12/2016, 3:01 PM Pager   If 7PM-7AM, please contact night-coverage [email or web address] Password [email or web address]

## 2016-07-12 NOTE — H&P (View-Only) (Signed)
Patient Name: Lance Stevens Date of Encounter: 07/12/2016     Principal Problem:   Pain in the chest Active Problems:   AKI (acute kidney injury) ([place])    SUBJECTIVE  Currently chest pain free. Awaiting heart cath  CURRENT MEDS . amLODipine  5 mg Oral Daily  . aspirin EC  81 mg Oral Daily  . atorvastatin  40 mg Oral q1800  . enoxaparin (LOVENOX) injection  40 mg Subcutaneous Q24H  . regadenoson  0.4 mg Intravenous Once  . sodium chloride flush  3 mL Intravenous Q12H    OBJECTIVE  Vitals:   07/11/16 1013 07/11/16 1309 07/11/16 2221 07/12/16 0528  BP:  (!) 145/81 128/65 132/77  Pulse: 89 73 66 77  Resp:  17 15 20   Temp:  98.4 F (36.9 C) 98.2 F (36.8 C) 98 F (36.7 C)  TempSrc:  Oral Oral Oral  SpO2:  94% 98% 98%  Weight:    206 lb 1.6 oz (93.5 kg)  Height:        Intake/Output Summary (Last 24 hours) at 07/12/16 1042 Last data filed at 07/12/16 0529  Gross per 24 hour  Intake              480 ml  Output              475 ml  Net                5 ml   Filed Weights   07/10/16 0531 07/11/16 0427 07/12/16 0528  Weight: 214 lb 8 oz (97.3 kg) 206 lb 6.4 oz (93.6 kg) 206 lb 1.6 oz (93.5 kg)    PHYSICAL EXAM  General: Pleasant, NAD. Neuro: Alert and oriented X 3. Moves all extremities spontaneously. Psych: Normal affect. HEENT:  Normal  Neck: Supple without bruits or JVD. Lungs:  Resp regular and unlabored, CTA. Heart: RRR no s3, s4, or murmurs. Abdomen: Soft, non-tender, non-distended, BS + x 4.  Extremities: No clubbing, cyanosis or edema. DP/PT/Radials 2+ and equal bilaterally.  Accessory Clinical Findings  CBC  Recent Labs  07/10/16 0229  WBC 8.0  NEUTROABS 4.7  HGB 13.7  HCT 41.0  MCV 93.8  PLT 0000000   Basic Metabolic Panel  Recent Labs  07/10/16 0229  NA 138  K 4.1  CL 105  CO2 25  GLUCOSE 143*  BUN 26*  CREATININE 1.34*  CALCIUM 9.1   Liver Function Tests  Recent Labs  07/10/16 0229  AST 10*  ALT 18  ALKPHOS 91    BILITOT 0.8  PROT 5.8*  ALBUMIN 3.6   No results for input(s): LIPASE, AMYLASE in the last 72 hours. Cardiac Enzymes  Recent Labs  07/10/16 0229 07/10/16 0547  TROPONINI <0.03 <0.03   BNP Invalid input(s): POCBNP D-Dimer No results for input(s): DDIMER in the last 72 hours. Hemoglobin A1C  Recent Labs  07/10/16 0547  HGBA1C 6.8*   Fasting Lipid Panel  Recent Labs  07/10/16 0547  CHOL 196  HDL 24*  LDLCALC 99  TRIG 365*  CHOLHDL 8.2   Thyroid Function Tests No results for input(s): TSH, T4TOTAL, T3FREE, THYROIDAB in the last 72 hours.  Invalid input(s): FREET3  TELE  Sinus with some brady  Radiology/Studies  Nm Myocar Multi W/spect W/wall Motion / Ef  Result Date: 07/11/2016 CLINICAL DATA:  70 year old male with new onset chest pressure EXAM: MYOCARDIAL IMAGING WITH SPECT (REST AND PHARMACOLOGIC-STRESS) GATED LEFT VENTRICULAR WALL MOTION STUDY LEFT VENTRICULAR EJECTION FRACTION TECHNIQUE:  Standard myocardial SPECT imaging was performed after resting intravenous injection of 10 mCi Tc-31m tetrofosmin. Subsequently, intravenous infusion of Lexiscan was performed under the supervision of the Cardiology staff. At peak effect of the drug, 30 mCi Tc-14m tetrofosmin was injected intravenously and standard myocardial SPECT imaging was performed. Quantitative gated imaging was also performed to evaluate left ventricular wall motion, and estimate left ventricular ejection fraction. COMPARISON:  Chest x-ray 07/10/2016 FINDINGS: Perfusion: No decreased activity in the left ventricle on stress imaging to suggest reversible ischemia. There is a region of mildly attenuated radiotracer activity in the inferolateral wall extending to the true apex both at rest and during stress suggesting a region of prior infarct or scarring. Diaphragmatic attenuation artifact is also possible. Wall Motion: Normal left ventricular wall motion. No left ventricular dilation. Left Ventricular Ejection  Fraction: 57 % End diastolic volume 47 ml End systolic volume 20 ml IMPRESSION: 1. No evidence of reversible ischemia. Moderate region of fixed defect in the inferolateral wall extending to the true apex suggests an area of prior infarct or scarring versus diaphragmatic attenuation artifact. 2. Normal left ventricular wall motion. 3. Left ventricular ejection fraction 57% 4. Non invasive risk stratification*: Low *2012 Appropriate Use Criteria for Coronary Revascularization Focused Update: J Am Coll Cardiol. [id number]. http://content.[email or web address].aspx?articleid=[email or web address] Electronically Signed   By: Jacqulynn Cadet M.D.   On: 07/11/2016 12:37   Dg Chest Portable 1 View  Result Date: 07/10/2016 CLINICAL DATA:  Chest pain and pressure.  Left arm pain. EXAM: PORTABLE CHEST 1 VIEW COMPARISON:  Chest CT 02/25/2016 FINDINGS: The cardiomediastinal contours are normal. The lungs are clear. Pulmonary vasculature is normal. No consolidation, pleural effusion, or pneumothorax. Cortical thickening of the right fourth and left ninth ribs on a CT not well visualized radiographically. There is degenerative change in the spine. IMPRESSION: No acute abnormality. Electronically Signed   By: Jeb Levering M.D.   On: 07/10/2016 02:29    ASSESSMENT AND PLAN  Lance Stevens is a 70 y.o. male with a history of strong family history of CAD but no personal CAD, DMT2, hypertension, hyperlipidemia and history of prostate cancer who presented to [facility] on 07/10/16 for evaluation of chest pain.  Chest pain: symptoms felt to be both typical and atypical. He is ruled out for MI. Stress test performed on 07/11/16 with equivocal results. Per Dr. Haroldine Laws, with significant RFs, 3V coronary calcifications seen on CT scan and ECG with exertional ST depression and equivocal stress testing results it was decided to proceed with cardiac catheterization.  -- Plan for cardiac catheterization today 12 with Dr.  Martinique  HTN: Amlodipine 5mg  was added for blood pressure control. Now well controlled  HLD: LDL 99. If found to have CAD, LDL goal <70. Continue statin.   DMT2: Hg A1c 6.8. Per IM.   Signed, Angelena Form PA-C  Pager [phone] Agree with note by Nell Range PA-C  Good story for angina. + CRF, + GXT with  Neg MV images, Cor CA on Chest CT. Exam benign. Labs OK. Enz neg. Agree with cath to define anatomy.   Lorretta Harp, M.D., [place], [facility], [place] [address] [place], [place]  [id number]  [phone] 07/12/2016 12:41 PM

## 2016-07-12 NOTE — Care Management Obs Status (Signed)
[place] NOTIFICATION   Patient Details  Name: EVA MOODIE MRN: [id number] Date of Birth: 1946-08-21   Medicare Observation Status Notification Given:  Yes    Bethena Roys, RN 07/12/2016, 10:48 AM

## 2016-07-12 NOTE — Care Management Note (Signed)
Case Management Note  Patient Details  Name: Lance Stevens MRN: [id number] Date of Birth: [birth year]  Subjective/Objective:   Pt presented for chest pain. Plan for cardiac cath 07-12-16.Pt is from home with wife and plan will be to return home once stable.                  Action/Plan: CM did provide pt with number to insurance company to call and verify if payment would occur for cardiac cath under observation status. Pt to proceed with cardiac cath. Pt is aware of co pay of $250.00. No further needs from CM at this time.  Expected Discharge Date:                  Expected Discharge Plan:  Home/Self Care  In-House Referral:  NA  Discharge planning Services  CM Consult  Post Acute Care Choice:  NA Choice offered to:  NA  DME Arranged:  N/A DME Agency:  NA  HH Arranged:  NA HH Agency:  NA  Status of Service:  Completed, signed off  If discussed at [place] of Stay Meetings, dates discussed:    Additional Comments:  Bethena Roys, RN 07/12/2016, 10:49 AM

## 2016-07-12 NOTE — Progress Notes (Signed)
I have reviewed angio with Dr Martinique. There does appear to be signif CAD in mid and distal RCA/PLA, ostial and prox LAD with nl LV fxn. Given the fact that he only had one episode of CP with nl perfusion on MV, it's certainly reasonable to Rx medically for now. His HR is in the low 60s on low dose BB. Can probably be D/Cd home today and followed up in the office in 2-3 weeks by Dr Stanford Breed. If he fails med Rx his only remaining option is CABG given his anatomy.  Lorretta Harp, M.D., [place], [facility], [place] [address] [place], [place]  [address]  [phone] 07/12/2016 2:40 PM

## 2016-07-12 NOTE — Progress Notes (Signed)
Nutrition Brief Note  Patient identified on the Malnutrition Screening Tool (MST) Report.  Patient has lost weight due to lifestyle changes. Weight loss was not unintentional.  Wt Readings from Last 15 Encounters:  07/12/16 206 lb 1.6 oz (93.5 kg)  04/05/16 217 lb (98.4 kg)  03/26/16 217 lb (98.4 kg)  03/19/16 219 lb 6.4 oz (99.5 kg)  12/09/15 210 lb (95.3 kg)  11/27/15 213 lb 9.6 oz (96.9 kg)  08/27/15 218 lb (98.9 kg)  08/13/15 218 lb (98.9 kg)  05/07/15 218 lb 3.2 oz (99 kg)  12/04/13 235 lb 6.4 oz (106.8 kg)  09/05/13 234 lb (106.1 kg)    Body mass index is 30.44 kg/m. Patient meets criteria for obesity based on current BMI.   Current diet order is NPO for procedure. Previously on heart healthy diet with good intake. Labs and medications reviewed.   No nutrition interventions warranted at this time. If nutrition issues arise, please consult RD.   Molli Barrows, RD, LDN, [place] Pager [phone] After Hours Pager [phone]

## 2016-07-12 NOTE — Progress Notes (Signed)
Patient Name: Lance Stevens Date of Encounter: 07/12/2016     Principal Problem:   Pain in the chest Active Problems:   AKI (acute kidney injury) ([place])    SUBJECTIVE  Currently chest pain free. Awaiting heart cath  CURRENT MEDS . amLODipine  5 mg Oral Daily  . aspirin EC  81 mg Oral Daily  . atorvastatin  40 mg Oral q1800  . enoxaparin (LOVENOX) injection  40 mg Subcutaneous Q24H  . regadenoson  0.4 mg Intravenous Once  . sodium chloride flush  3 mL Intravenous Q12H    OBJECTIVE  Vitals:   07/11/16 1013 07/11/16 1309 07/11/16 2221 07/12/16 0528  BP:  (!) 145/81 128/65 132/77  Pulse: 89 73 66 77  Resp:  17 15 20   Temp:  98.4 F (36.9 C) 98.2 F (36.8 C) 98 F (36.7 C)  TempSrc:  Oral Oral Oral  SpO2:  94% 98% 98%  Weight:    206 lb 1.6 oz (93.5 kg)  Height:        Intake/Output Summary (Last 24 hours) at 07/12/16 1042 Last data filed at 07/12/16 0529  Gross per 24 hour  Intake              480 ml  Output              475 ml  Net                5 ml   Filed Weights   07/10/16 0531 07/11/16 0427 07/12/16 0528  Weight: 214 lb 8 oz (97.3 kg) 206 lb 6.4 oz (93.6 kg) 206 lb 1.6 oz (93.5 kg)    PHYSICAL EXAM  General: Pleasant, NAD. Neuro: Alert and oriented X 3. Moves all extremities spontaneously. Psych: Normal affect. HEENT:  Normal  Neck: Supple without bruits or JVD. Lungs:  Resp regular and unlabored, CTA. Heart: RRR no s3, s4, or murmurs. Abdomen: Soft, non-tender, non-distended, BS + x 4.  Extremities: No clubbing, cyanosis or edema. DP/PT/Radials 2+ and equal bilaterally.  Accessory Clinical Findings  CBC  Recent Labs  07/10/16 0229  WBC 8.0  NEUTROABS 4.7  HGB 13.7  HCT 41.0  MCV 93.8  PLT 0000000   Basic Metabolic Panel  Recent Labs  07/10/16 0229  NA 138  K 4.1  CL 105  CO2 25  GLUCOSE 143*  BUN 26*  CREATININE 1.34*  CALCIUM 9.1   Liver Function Tests  Recent Labs  07/10/16 0229  AST 10*  ALT 18  ALKPHOS 91    BILITOT 0.8  PROT 5.8*  ALBUMIN 3.6   No results for input(s): LIPASE, AMYLASE in the last 72 hours. Cardiac Enzymes  Recent Labs  07/10/16 0229 07/10/16 0547  TROPONINI <0.03 <0.03   BNP Invalid input(s): POCBNP D-Dimer No results for input(s): DDIMER in the last 72 hours. Hemoglobin A1C  Recent Labs  07/10/16 0547  HGBA1C 6.8*   Fasting Lipid Panel  Recent Labs  07/10/16 0547  CHOL 196  HDL 24*  LDLCALC 99  TRIG 365*  CHOLHDL 8.2   Thyroid Function Tests No results for input(s): TSH, T4TOTAL, T3FREE, THYROIDAB in the last 72 hours.  Invalid input(s): FREET3  TELE  Sinus with some brady  Radiology/Studies  Nm Myocar Multi W/spect W/wall Motion / Ef  Result Date: 07/11/2016 CLINICAL DATA:  70 year old male with new onset chest pressure EXAM: MYOCARDIAL IMAGING WITH SPECT (REST AND PHARMACOLOGIC-STRESS) GATED LEFT VENTRICULAR WALL MOTION STUDY LEFT VENTRICULAR EJECTION FRACTION TECHNIQUE:  Standard myocardial SPECT imaging was performed after resting intravenous injection of 10 mCi Tc-21m tetrofosmin. Subsequently, intravenous infusion of Lexiscan was performed under the supervision of the Cardiology staff. At peak effect of the drug, 30 mCi Tc-47m tetrofosmin was injected intravenously and standard myocardial SPECT imaging was performed. Quantitative gated imaging was also performed to evaluate left ventricular wall motion, and estimate left ventricular ejection fraction. COMPARISON:  Chest x-ray 07/10/2016 FINDINGS: Perfusion: No decreased activity in the left ventricle on stress imaging to suggest reversible ischemia. There is a region of mildly attenuated radiotracer activity in the inferolateral wall extending to the true apex both at rest and during stress suggesting a region of prior infarct or scarring. Diaphragmatic attenuation artifact is also possible. Wall Motion: Normal left ventricular wall motion. No left ventricular dilation. Left Ventricular Ejection  Fraction: 57 % End diastolic volume 47 ml End systolic volume 20 ml IMPRESSION: 1. No evidence of reversible ischemia. Moderate region of fixed defect in the inferolateral wall extending to the true apex suggests an area of prior infarct or scarring versus diaphragmatic attenuation artifact. 2. Normal left ventricular wall motion. 3. Left ventricular ejection fraction 57% 4. Non invasive risk stratification*: Low *2012 Appropriate Use Criteria for Coronary Revascularization Focused Update: J Am Coll Cardiol. N6492421. http://content.airportbarriers.com.aspx?articleid=1201161 Electronically Signed   By: Jacqulynn Cadet M.D.   On: 07/11/2016 12:37   Dg Chest Portable 1 View  Result Date: 07/10/2016 CLINICAL DATA:  Chest pain and pressure.  Left arm pain. EXAM: PORTABLE CHEST 1 VIEW COMPARISON:  Chest CT 02/25/2016 FINDINGS: The cardiomediastinal contours are normal. The lungs are clear. Pulmonary vasculature is normal. No consolidation, pleural effusion, or pneumothorax. Cortical thickening of the right fourth and left ninth ribs on a CT not well visualized radiographically. There is degenerative change in the spine. IMPRESSION: No acute abnormality. Electronically Signed   By: Jeb Levering M.D.   On: 07/10/2016 02:29    ASSESSMENT AND PLAN  REVANTH TRABUCCO is a 70 y.o. male with a history of strong family history of CAD but no personal CAD, DMT2, hypertension, hyperlipidemia and history of prostate cancer who presented to [facility] on 07/10/16 for evaluation of chest pain.  Chest pain: symptoms felt to be both typical and atypical. He is ruled out for MI. Stress test performed on 07/11/16 with equivocal results. Per Dr. Haroldine Laws, with significant RFs, 3V coronary calcifications seen on CT scan and ECG with exertional ST depression and equivocal stress testing results it was decided to proceed with cardiac catheterization.  -- Plan for cardiac catheterization today 12 with Dr.  Martinique  HTN: Amlodipine 5mg  was added for blood pressure control. Now well controlled  HLD: LDL 99. If found to have CAD, LDL goal <70. Continue statin.   DMT2: Hg A1c 6.8. Per IM.   Signed, Angelena Form PA-C  Pager [phone] Agree with note by Nell Range PA-C  Good story for angina. + CRF, + GXT with  Neg MV images, Cor CA on Chest CT. Exam benign. Labs OK. Enz neg. Agree with cath to define anatomy.   Lorretta Harp, M.D., [place], [facility], [place] [address] [place], [place]  [id number]  [phone] 07/12/2016 12:41 PM

## 2016-07-12 NOTE — Interval H&P Note (Signed)
History and Physical Interval Note:  07/12/2016 1:08 PM  Lance Stevens  has presented today for surgery, with the diagnosis of unstable angina  The various methods of treatment have been discussed with the patient and family. After consideration of risks, benefits and other options for treatment, the patient has consented to  Procedure(s): Left Heart Cath and Coronary Angiography (N/A) as a surgical intervention .  The patient's history has been reviewed, patient examined, no change in status, stable for surgery.  I have reviewed the patient's chart and labs.  Questions were answered to the patient's satisfaction.    Cath Lab Visit (complete for each Cath Lab visit)  Clinical Evaluation Leading to the Procedure:   ACS: Yes.    Non-ACS:    Anginal Classification: CCS III  Anti-ischemic medical therapy: Minimal Therapy (1 class of medications)  Non-Invasive Test Results: Low-risk stress test findings: cardiac mortality <1%/year  Prior CABG: No previous CABG       Collier Salina [facility] 07/12/2016 1:08 PM

## 2016-07-13 MED FILL — Verapamil HCl IV Soln 2.5 MG/ML: INTRAVENOUS | Qty: 2 | Status: AC

## 2016-07-17 ENCOUNTER — Emergency Department (HOSPITAL_COMMUNITY): Payer: Medicare Other

## 2016-07-17 ENCOUNTER — Inpatient Hospital Stay (HOSPITAL_COMMUNITY)
Admission: EM | Admit: 2016-07-17 | Discharge: 2016-07-19 | DRG: 303 | Disposition: A | Discharge status: Home / Self Care | Payer: Medicare Other | Attending: Cardiology | Admitting: Cardiology

## 2016-07-17 ENCOUNTER — Encounter (HOSPITAL_COMMUNITY): Payer: Self-pay | Admitting: Emergency Medicine

## 2016-07-17 DIAGNOSIS — Z7982 Long term (current) use of aspirin: Secondary | ICD-10-CM | POA: Diagnosis not present

## 2016-07-17 DIAGNOSIS — Z9841 Cataract extraction status, right eye: Secondary | ICD-10-CM

## 2016-07-17 DIAGNOSIS — R0789 Other chest pain: Secondary | ICD-10-CM | POA: Diagnosis not present

## 2016-07-17 DIAGNOSIS — Z9079 Acquired absence of other genital organ(s): Secondary | ICD-10-CM

## 2016-07-17 DIAGNOSIS — Z880 Allergy status to penicillin: Secondary | ICD-10-CM | POA: Diagnosis not present

## 2016-07-17 DIAGNOSIS — I10 Essential (primary) hypertension: Secondary | ICD-10-CM

## 2016-07-17 DIAGNOSIS — E785 Hyperlipidemia, unspecified: Secondary | ICD-10-CM | POA: Diagnosis present

## 2016-07-17 DIAGNOSIS — R079 Chest pain, unspecified: Secondary | ICD-10-CM | POA: Diagnosis present

## 2016-07-17 DIAGNOSIS — I25110 Atherosclerotic heart disease of native coronary artery with unstable angina pectoris: Secondary | ICD-10-CM

## 2016-07-17 DIAGNOSIS — E119 Type 2 diabetes mellitus without complications: Secondary | ICD-10-CM

## 2016-07-17 DIAGNOSIS — Z961 Presence of intraocular lens: Secondary | ICD-10-CM | POA: Diagnosis not present

## 2016-07-17 DIAGNOSIS — Z9842 Cataract extraction status, left eye: Secondary | ICD-10-CM | POA: Diagnosis not present

## 2016-07-17 DIAGNOSIS — Z79899 Other long term (current) drug therapy: Secondary | ICD-10-CM | POA: Diagnosis not present

## 2016-07-17 DIAGNOSIS — D72829 Elevated white blood cell count, unspecified: Secondary | ICD-10-CM | POA: Diagnosis present

## 2016-07-17 DIAGNOSIS — I129 Hypertensive chronic kidney disease with stage 1 through stage 4 chronic kidney disease, or unspecified chronic kidney disease: Secondary | ICD-10-CM | POA: Diagnosis present

## 2016-07-17 DIAGNOSIS — I2510 Atherosclerotic heart disease of native coronary artery without angina pectoris: Secondary | ICD-10-CM | POA: Diagnosis present

## 2016-07-17 DIAGNOSIS — I25119 Atherosclerotic heart disease of native coronary artery with unspecified angina pectoris: Secondary | ICD-10-CM | POA: Diagnosis not present

## 2016-07-17 DIAGNOSIS — Z8546 Personal history of malignant neoplasm of prostate: Secondary | ICD-10-CM

## 2016-07-17 DIAGNOSIS — N182 Chronic kidney disease, stage 2 (mild): Secondary | ICD-10-CM | POA: Diagnosis present

## 2016-07-17 DIAGNOSIS — I4510 Unspecified right bundle-branch block: Secondary | ICD-10-CM | POA: Diagnosis present

## 2016-07-17 DIAGNOSIS — I209 Angina pectoris, unspecified: Secondary | ICD-10-CM

## 2016-07-17 HISTORY — DX: Essential (primary) hypertension: I10

## 2016-07-17 HISTORY — DX: Atherosclerotic heart disease of native coronary artery without angina pectoris: I25.10

## 2016-07-17 HISTORY — DX: Hyperlipidemia, unspecified: E78.5

## 2016-07-17 LAB — TROPONIN I: TROPONIN I: 0.03 ng/mL — AB (ref <0.03)

## 2016-07-17 LAB — CBC
HEMATOCRIT: 43.4 % (ref 39.0–52.0)
Hemoglobin: 14.5 g/dL (ref 13.0–17.0)
MCH: 30.9 pg (ref 26.0–34.0)
MCHC: 33.4 g/dL (ref 30.0–36.0)
MCV: 92.5 fL (ref 78.0–100.0)
PLATELETS: 282 10*3/uL (ref 150–400)
RBC: 4.69 MIL/uL (ref 4.22–5.81)
RDW: 12.4 % (ref 11.5–15.5)
WBC: 16.4 10*3/uL — ABNORMAL HIGH (ref 4.0–10.5)

## 2016-07-17 LAB — BASIC METABOLIC PANEL
Anion gap: 10 (ref 5–15)
BUN: 17 mg/dL (ref 6–20)
CHLORIDE: 101 mmol/L (ref 101–111)
CO2: 25 mmol/L (ref 22–32)
CREATININE: 1.18 mg/dL (ref 0.61–1.24)
Calcium: 9.2 mg/dL (ref 8.9–10.3)
GFR calc Af Amer: >60 mL/min (ref >60)
GFR calc non Af Amer: >60 mL/min (ref >60)
GLUCOSE: 188 mg/dL — AB (ref 65–99)
Potassium: 4.3 mmol/L (ref 3.5–5.1)
SODIUM: 136 mmol/L (ref 135–145)

## 2016-07-17 LAB — I-STAT TROPONIN, ED: Troponin i, poc: 0 ng/mL (ref 0.00–0.08)

## 2016-07-17 LAB — BRAIN NATRIURETIC PEPTIDE: B Natriuretic Peptide: 50.1 pg/mL (ref 0.0–100.0)

## 2016-07-17 LAB — CBG MONITORING, ED: Glucose-Capillary: 201 mg/dL — ABNORMAL HIGH (ref 65–99)

## 2016-07-17 LAB — MRSA PCR SCREENING: MRSA by PCR: NEGATIVE

## 2016-07-17 MED ORDER — NITROGLYCERIN 0.4 MG SL SUBL
0.4000 mg | SUBLINGUAL_TABLET | SUBLINGUAL | Status: DC | PRN
  Administered 2016-07-17: 0.4 mg via SUBLINGUAL
  Filled 2016-07-17: qty 1

## 2016-07-17 MED ORDER — NITROGLYCERIN IN D5W 200-5 MCG/ML-% IV SOLN
0.0000 ug/min | INTRAVENOUS | Status: DC
  Administered 2016-07-17: 5 ug/min via INTRAVENOUS
  Filled 2016-07-17: qty 250

## 2016-07-17 MED ORDER — ENOXAPARIN SODIUM 40 MG/0.4ML ~~LOC~~ SOLN
40.0000 mg | SUBCUTANEOUS | Status: DC
  Administered 2016-07-18: 40 mg via SUBCUTANEOUS
  Filled 2016-07-17: qty 0.4

## 2016-07-17 MED ORDER — INSULIN ASPART 100 UNIT/ML ~~LOC~~ SOLN
0.0000 [IU] | Freq: Three times a day (TID) | SUBCUTANEOUS | Status: DC
  Administered 2016-07-17: 5 [IU] via SUBCUTANEOUS
  Administered 2016-07-18: 3 [IU] via SUBCUTANEOUS
  Administered 2016-07-19: 2 [IU] via SUBCUTANEOUS
  Administered 2016-07-19: 3 [IU] via SUBCUTANEOUS
  Filled 2016-07-17: qty 1

## 2016-07-17 MED ORDER — AMLODIPINE BESYLATE 5 MG PO TABS
5.0000 mg | ORAL_TABLET | Freq: Every day | ORAL | Status: DC
  Administered 2016-07-18 – 2016-07-19 (×2): 5 mg via ORAL
  Filled 2016-07-17 (×2): qty 1

## 2016-07-17 MED ORDER — ATORVASTATIN CALCIUM 40 MG PO TABS
40.0000 mg | ORAL_TABLET | Freq: Every day | ORAL | Status: DC
  Administered 2016-07-17 – 2016-07-19 (×3): 40 mg via ORAL
  Filled 2016-07-17 (×3): qty 1

## 2016-07-17 MED ORDER — ONDANSETRON HCL 4 MG/2ML IJ SOLN: 4.0000 mg | Freq: Four times a day (QID) | INTRAMUSCULAR | Status: DC | PRN

## 2016-07-17 MED ORDER — ASPIRIN EC 81 MG PO TBEC
81.0000 mg | DELAYED_RELEASE_TABLET | Freq: Every day | ORAL | Status: DC
  Administered 2016-07-18 – 2016-07-19 (×2): 81 mg via ORAL
  Filled 2016-07-17 (×2): qty 1

## 2016-07-17 MED ORDER — ACETAMINOPHEN 325 MG PO TABS: 650.0000 mg | ORAL_TABLET | ORAL | Status: DC | PRN

## 2016-07-17 MED ORDER — METOPROLOL SUCCINATE ER 25 MG PO TB24
12.5000 mg | ORAL_TABLET | Freq: Every day | ORAL | Status: DC
  Administered 2016-07-18: 12.5 mg via ORAL
  Filled 2016-07-17: qty 1

## 2016-07-17 NOTE — Progress Notes (Signed)
Pt refusing to wear non skid socks. Rn discussed patient safety. Pt verbalized understanding

## 2016-07-17 NOTE — ED Provider Notes (Signed)
[facility] DEPT Provider Note   CSN: [id number] Arrival date & time: 07/17/16  [id number]  First Provider Contact:  First MD Initiated Contact with Patient 07/17/16 [phone]        History   Chief Complaint Chief Complaint  Patient presents with  . Chest Pain  . Shortness of Breath    HPI Lance Stevens is a 70 y.o. male.  The history is provided by the patient and medical records.  Chest Pain   Associated symptoms include shortness of breath.  Shortness of Breath  Associated symptoms include chest pain.    70 year old male with history of borderline diabetes, recurrent bronchitis, gout, history of skin and prostate cancer, sinus infections, presenting to the ED for chest pain. Patient was recently admitted for the same on 07/10/2016. He underwent a cardiac catheterization performed by Dr. Martinique and was found to have significant CAD in the mid and distal RCA/PLA, ostial and proximal LAD.  He had a normal LV function.  Cardiology elected to medically manage now, potential CABG in the future.  He was d/c on 07/12/16 and started on atorvastatin, metoprolol and amlodipine and has been taking as directed. He has also been taking it daily 81 mg aspirin. States he was doing well until around 4 AM this morning when pain woke him from sleep. He states it is a pressure sensation in his chest. He also reports some shortness of breath.  He denies any dizziness, diaphoresis, nausea, vomiting, numbness, or weakness.  Denies any heavy exertion over the past few days, did take a light walk with his wife.  VSS.  Past Medical History:  Diagnosis Date  . Allergy   . Borderline diabetes   . Bronchitis    recurrent  . Fracture, finger    LEFT HAND WITH SWELLING AND BRUISING  . History of gout   . History of kidney stones   . History of skin cancer    possible - pt not sure of dx  . Prostate cancer ([place])   . Sinus infection    recurrent    Patient Active Problem List   Diagnosis Date Noted  . CKD  (chronic kidney disease) stage 3, GFR 30-59 ml/min 07/12/2016  . Dyslipidemia 07/12/2016  . CAD (coronary artery disease) 07/12/2016  . Pain in the chest 07/10/2016  . Prostate cancer ([place]) 04/05/2016  . 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 Elevated PSA 12/11/2015  . Hypertriglyceridemia 12/11/2015  . Diabetes mellitus type 2, uncomplicated ([place]) 0000000  . Blood pressure elevated 05/27/2015  . Colon cancer screening 05/27/2015  . Need for shingles vaccine 05/27/2015  . Family history of Parkinson's disease 05/27/2015  . History of nephrolithiasis 05/27/2015    Past Surgical History:  Procedure Laterality Date  . CARDIAC CATHETERIZATION N/A 07/12/2016   Procedure: Left Heart Cath and Coronary Angiography;  Surgeon: Peter M Martinique, MD;  Location: [facility];  Service: Cardiovascular;  Laterality: N/A;  . EYE SURGERY     B cataracts removed.  Marland Kitchen EYE SURGERY     lens implants  . LITHOTRIPSY    . LYMPHADENECTOMY Bilateral 04/05/2016   Procedure: LYMPHADENECTOMY, BILATERAL PELVIC LYMPHADENECTOMY;  Surgeon: Raynelle Bring, MD;  Location: [facility];  Service: Urology;  Laterality: Bilateral;  . PROSTATE BIOPSY    . ROBOT ASSISTED LAPAROSCOPIC RADICAL PROSTATECTOMY N/A 04/05/2016   Procedure: XI ROBOTIC ASSISTED LAPAROSCOPIC RADICAL  PROSTATECTOMY LEVEL 2;  Surgeon: Raynelle Bring, MD;  Location: [facility];  Service: Urology;  Laterality: N/A;  . TONSILLECTOMY AND ADENOIDECTOMY     also received radiation treatments, as was frequent in children at the time  . WISDOM TOOTH EXTRACTION         Home Medications    Prior to Admission medications   Medication Sig Start Date End Date Taking? Authorizing Provider  amLODipine (NORVASC) 5 MG tablet Take 1 tablet (5 mg total) by mouth daily. 07/12/16   Debbe Odea, MD  aspirin EC 81 MG EC tablet Take 1 tablet (81 mg total) by mouth daily. 07/12/16   Debbe Odea, MD  atorvastatin (LIPITOR) 40 MG tablet Take 1 tablet (40 mg total) by mouth daily at 6 PM. 07/12/16   Debbe Odea, MD  metoprolol succinate (TOPROL-XL) 25 MG 24 hr tablet Take 0.5 tablets (12.5 mg total) by mouth daily. 07/12/16   Debbe Odea, MD    Family History Family History  Problem Relation Age of Onset  . Heart attack Father   . Heart disease Father 7    AMI as cause of death  . Diabetes Father   . Hyperlipidemia Father   . Parkinson's disease Brother   . Diabetes Brother   . Cancer Brother     melanoma retina  . Heart disease Brother   . Stroke Maternal Grandmother   . Cancer Mother 70    Hodgkins   . Glaucoma Mother   . Colon cancer Neg Hx   . Colon polyps Neg Hx     Social History Social History  Substance Use Topics  . Smoking status: Never Smoker  . Smokeless tobacco: Never Used  . Alcohol use 1.2 oz/week    2 Standard drinks or equivalent per week     Comment: wine - red 2 glasses wine per week     Allergies   Penicillins   Review of Systems Review of Systems  Respiratory: Positive for shortness of breath.   Cardiovascular: Positive for chest pain.  All other systems reviewed and are negative.    Physical Exam Updated Vital Signs BP 134/79 (BP Location: Right Arm)   Pulse 69   Temp 98.4 F (36.9 C) (Oral)   Resp 18   Ht 5\' 9"  (1.753 m)   Wt 93.4 kg   SpO2 97%   BMI 30.42 kg/m   Physical Exam  Constitutional: He is oriented to person, place, and time. He appears well-developed and well-nourished.  HENT:  Head: Normocephalic and atraumatic.  Mouth/Throat: Oropharynx is clear and moist.  Eyes: Conjunctivae and EOM are normal. Pupils are equal, round, and reactive to light.  Neck: Normal range of motion.  Cardiovascular: Normal rate, regular rhythm and normal heart sounds.   Pulmonary/Chest: Effort normal and breath sounds normal.  Respirations unlabored, lungs overall clear  Abdominal: Soft. Bowel sounds are normal.   Musculoskeletal: Normal range of motion.  No significant edema noted  Neurological: He is alert and oriented to person, place, and time.  Skin: Skin is warm and dry.  Psychiatric: He has a normal mood and affect.  Nursing note and vitals reviewed.    ED Treatments / Results  Labs (all labs ordered are listed, but only abnormal results are displayed) Labs Reviewed  BASIC METABOLIC PANEL - Abnormal; Notable for the following:       Result Value   Glucose, Bld 188 (*)    All other components within normal limits  CBC -  Abnormal; Notable for the following:    WBC 16.4 (*)    All other components within normal limits  CBG MONITORING, ED - Abnormal; Notable for the following:    Glucose-Capillary 201 (*)    All other components within normal limits  BRAIN NATRIURETIC PEPTIDE  I-STAT TROPOININ, ED    EKG  EKG Interpretation  Date/Time:  Saturday July 17 2016 09:00:05 EDT Ventricular Rate:  77 PR Interval:  142 QRS Duration: 120 QT Interval:  422 QTC Calculation: 477 R Axis:   -30 Text Interpretation:  Normal sinus rhythm Left axis deviation Low voltage QRS Right bundle branch block Abnormal ECG Confirmed by Cleavon Goldman MD, Awad Gladd ([id number]) on 07/17/2016 9:49:12 AM       Radiology Dg Chest 2 View  Result Date: 07/17/2016 CLINICAL DATA:  Patient with chest pain and pressure. EXAM: CHEST  2 VIEW COMPARISON:  Chest radiograph 07/10/2016 FINDINGS: Normal cardiac and mediastinal contours. No consolidative pulmonary opacities. No pleural effusion pneumothorax. Thoracic spine degenerative changes. IMPRESSION: No active cardiopulmonary disease. Electronically Signed   By: Lovey Newcomer M.D.   On: 07/17/2016 09:30    Procedures Procedures (including critical care time)  Medications Ordered in ED Medications  nitroGLYCERIN (NITROSTAT) SL tablet 0.4 mg (0.4 mg Sublingual Given 07/17/16 1108)  nitroGLYCERIN 50 mg in dextrose 5 % 250 mL (0.2 mg/mL) infusion (5 mcg/min Intravenous New  Bag/Given 07/17/16 1218)  insulin aspart (novoLOG) injection 0-15 Units (5 Units Subcutaneous Given 07/17/16 1225)     Initial Impression / Assessment and Plan / ED Course  I have reviewed the triage vital signs and the nursing notes.  Pertinent labs & imaging results that were available during my care of the patient were reviewed by me and considered in my medical decision making (see chart for details).  Clinical Course   70 year old male here with chest pain. Recently admitted for the same and found to have significant coronary artery disease of distal RCA. This was elected to be managed medically.  Reports recurrence of pain this morning at 4 AM which woke him from sleep. Rates his pain a 2 at this time but is notable. His EKG here is unchanged from previous. Chest x-ray is clear. His lab work is reassuring. Cardiology has evaluated the patient and will admit for further management.  Final Clinical Impressions(s) / ED Diagnoses   Final diagnoses:  Chest pain, unspecified chest pain type    New Prescriptions New Prescriptions   No medications on file     Larene Pickett, Hershal Coria 07/17/16 1453  Medical screening examination/treatment/procedure(s) were conducted as a shared visit with non-physician practitioner(s) and myself.  I personally evaluated the patient during the encounter.  Pt was d/w cardiology who will admit pt for further eval due to the extensive disease seen on cath.  EKG Interpretation  Date/Time:  Saturday July 17 2016 09:00:05 EDT Ventricular Rate:  77 PR Interval:  142 QRS Duration: 120 QT Interval:  422 QTC Calculation: 477 R Axis:   -30 Text Interpretation:  Normal sinus rhythm Left axis deviation Low voltage QRS Right bundle branch block Abnormal ECG Confirmed by [facility] MD, Donnella Morford ([id number]) on 07/17/2016 9:49:12 AM        Isla Pence, MD 07/18/16 [phone]

## 2016-07-17 NOTE — ED Notes (Signed)
Attempted report x1. 

## 2016-07-17 NOTE — ED Notes (Signed)
Attempted report 

## 2016-07-17 NOTE — H&P (Signed)
History & Physical    Patient ID: Lance Stevens MRN: [id number], DOB/AGE: [birth year]   Admit date: 07/17/2016   Primary Physician: Harrison Mons, PA-C Primary Cardiologist: Dr. Stanford Breed   History of Present Illness    Lance Stevens is a 70 y.o. male with past medical history of CAD, HTN, HLD, and remote history of prostate cancer who presents to [name] ED on 07/17/2016 for evaluation of chest pain.   He was recently admitted from 07/10/2016 - 07/12/2016 for chest pain as well. His symptoms were thought to have both atypical and typical components, therefore a NST was initially performed. This showed no evidence of reversible ischemia but a moderate region of fixed defect in the inferolateral wall extending to the true apex suggesting an area of prior infarct or scarring versus diaphragmatic attenuation artifact. Dr. Haroldine Laws reviewed this along with a Chest CT which showed 3c coronary calcifications and recommended a cardiac catheterization. This was performed on 07/12/2016 and showed 3-vessel CAD (LAD with 50% ostial, 80% mid, and 70% distal disease, 90% very small OM1, 50% mid RCA, 70% very small PDA, 80% PLOM. Aggressive medical therapy was recommended and he was started on ASA, Amlodipine, BB, and a statin. Dr. Gwenlyn Found mentioned that if he continued to have angina while on medical therapy, then he should be considered for revascularization with CABG.   Since being discharged five days ago, he reports doing well until this morning. Had been taking his new medications as prescribed without any symptoms. This morning, he developed pain around 0400 which awoke him from sleep. Says it has been constant since but increasing in severity. No association with rest or exertion. Had mild diaphoresis this AM. No nausea, vomiting, or dyspnea.    While in the ED, labs have shown a WBC of 16.4, Hgb 14.5, platelets 282. Electrolytes WNL. Creatinine 1.18. BNP 50.1. Initial troponin negative. CXR with no  active cardiopulmonary disease. EKG shows NSR, HR 77, with RBBB and down-sloping ST in Leads I and II.   Past Medical History    Past Medical History:  Diagnosis Date  . Allergy   . Borderline diabetes   . Bronchitis    recurrent  . Fracture, finger    LEFT HAND WITH SWELLING AND BRUISING  . History of gout   . History of kidney stones   . History of skin cancer    possible - pt not sure of dx  . Prostate cancer ([place])   . Sinus infection    recurrent    Past Surgical History:  Procedure Laterality Date  . CARDIAC CATHETERIZATION N/A 07/12/2016   Procedure: Left Heart Cath and Coronary Angiography;  Surgeon: Peter M Martinique, MD;  Location: [place] [facility];  Service: Cardiovascular;  Laterality: N/A;  . EYE SURGERY     B cataracts removed.  Marland Kitchen EYE SURGERY     lens implants  . LITHOTRIPSY    . LYMPHADENECTOMY Bilateral 04/05/2016   Procedure: LYMPHADENECTOMY, BILATERAL PELVIC LYMPHADENECTOMY;  Surgeon: Raynelle Bring, MD;  Location: [facility];  Service: Urology;  Laterality: Bilateral;  . PROSTATE BIOPSY    . ROBOT ASSISTED LAPAROSCOPIC RADICAL PROSTATECTOMY N/A 04/05/2016   Procedure: XI ROBOTIC ASSISTED LAPAROSCOPIC RADICAL PROSTATECTOMY LEVEL 2;  Surgeon: Raynelle Bring, MD;  Location: [facility];  Service: Urology;  Laterality: N/A;  . TONSILLECTOMY AND ADENOIDECTOMY     also received radiation treatments, as was frequent in children at the time  . WISDOM TOOTH EXTRACTION  Allergies  Allergies  Allergen Reactions  . Penicillins Swelling and Other (See Comments)    As a child. Has patient had a PCN reaction causing immediate rash, facial/tongue/throat swelling, SOB or lightheadedness with hypotension: Yes Has patient had a PCN reaction causing severe rash involving mucus membranes or skin necrosis: No Has patient had a PCN reaction that required hospitalization: Was already in hospital when reaction happened Has patient had a PCN reaction occurring within the last 10 years: No   If all of the above answers are "NO", then may proceed with Cephalosporin use.      Home Medications    Prior to Admission medications   Medication Sig Start Date End Date Taking? Authorizing Provider  amLODipine (NORVASC) 5 MG tablet Take 1 tablet (5 mg total) by mouth daily. 07/12/16  Yes Debbe Odea, MD  aspirin EC 81 MG EC tablet Take 1 tablet (81 mg total) by mouth daily. 07/12/16  Yes Debbe Odea, MD  atorvastatin (LIPITOR) 40 MG tablet Take 1 tablet (40 mg total) by mouth daily at 6 PM. 07/12/16  Yes Saima Rizwan, MD  KRILL OIL PO Take 1 capsule by mouth daily.   Yes Historical Provider, MD  metoprolol succinate (TOPROL-XL) 25 MG 24 hr tablet Take 0.5 tablets (12.5 mg total) by mouth daily. 07/12/16  Yes Debbe Odea, MD    Family History    Family History  Problem Relation Age of Onset  . Heart attack Father   . Heart disease Father 67    AMI as cause of death  . Diabetes Father   . Hyperlipidemia Father   . Parkinson's disease Brother   . Diabetes Brother   . Cancer Brother     melanoma retina  . Heart disease Brother   . Stroke Maternal Grandmother   . Cancer Mother 25    Hodgkins   . Glaucoma Mother   . Colon cancer Neg Hx   . Colon polyps Neg Hx     Social History    Social History   Social History  . Marital status: Married    Spouse name: [name]  . Number of children: 3  . Years of education: N/A   Occupational History  . 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 Smoking status: Never Smoker  . Smokeless tobacco: Never Used  . Alcohol use 1.2 oz/week    2 Standard drinks or equivalent per week     Comment: wine - red 2 glasses wine per week  . Drug use: No  . Sexual activity: Yes    Partners: Female   Other Topics Concern  . Not on file   Social History Narrative   Marital status: married x 25 years; second marriage; first marriage x 12 years.      Children: 3 daughters and  one step-daughter; 4 grandchildren      Lives: with wife.      Employment: semi-retired; Company secretary; professor; Social worker, Probation officer.      Tobacco:  no      Alcohol: about 2 glasses of wine/week      Exercise: active in the yard.     Review of Systems    General:  No chills, fever, night sweats or weight changes.  Cardiovascular:  No dyspnea on exertion, edema, orthopnea, palpitations, paroxysmal nocturnal dyspnea. Positive for chest pain and diaphoresis.  Dermatological: No rash, lesions/masses Respiratory: No cough, dyspnea Urologic: No hematuria, dysuria Abdominal:  No nausea, vomiting, diarrhea, bright red blood per rectum, melena, or hematemesis Neurologic:  No visual changes, wkns, changes in mental status. All other systems reviewed and are otherwise negative except as noted above.  Physical Exam    Blood pressure 145/80, pulse 72, temperature 98.4 F (36.9 C), temperature source Oral, resp. rate 20, height 5\' 9"  (1.753 m), weight 206 lb (93.4 kg), SpO2 97 %.  General: Well developed, well nourished,male in no acute distress. Head: Normocephalic, atraumatic, sclera non-icteric, no xanthomas, nares are without discharge. Dentition:  Neck: No carotid bruits. JVD not elevated.  Lungs: Respirations regular and unlabored, without wheezes or rales.  Heart: Regular rate and rhythm. No S3 or S4.  No murmur, no rubs, or gallops appreciated. Abdomen: Soft, non-tender, non-distended with normoactive bowel sounds. No hepatomegaly. No rebound/guarding. No obvious abdominal masses. Msk:  Strength and tone appear normal for age. No joint deformities or effusions. Extremities: No clubbing or cyanosis. No edema.  Distal pedal pulses are 2+ bilaterally. Neuro: Alert and oriented X 3. Moves all extremities spontaneously. No focal deficits noted. Psych:  Responds to questions appropriately with a normal affect. Skin: No rashes or lesions noted  Labs    Troponin [facility] of Care Test)  Recent  Labs  07/17/16 0922  TROPIPOC 0.00   No results for input(s): CKTOTAL, CKMB, TROPONINI in the last 72 hours. Lab Results  Component Value Date   WBC 16.4 (H) 07/17/2016   HGB 14.5 07/17/2016   HCT 43.4 07/17/2016   MCV 92.5 07/17/2016   PLT 282 07/17/2016    Recent Labs Lab 07/17/16 0915  NA 136  K 4.3  CL 101  CO2 25  BUN 17  CREATININE 1.18  CALCIUM 9.2  GLUCOSE 188*   Lab Results  Component Value Date   CHOL 196 07/10/2016   HDL 24 (L) 07/10/2016   LDLCALC 99 07/10/2016   TRIG 365 (H) 07/10/2016   No results found for: DDIMER   B Natriuretic Peptide  Date/Time Value Ref Range Status  07/17/2016 09:15 AM 50.1 0.0 - 100.0 pg/mL Final   No results found for: PROBNP No results for input(s): INR in the last 72 hours.    Radiology Studies    Dg Chest 2 View  Result Date: 07/17/2016 CLINICAL DATA:  Patient with chest pain and pressure. EXAM: CHEST  2 VIEW COMPARISON:  Chest radiograph 07/10/2016 FINDINGS: Normal cardiac and mediastinal contours. No consolidative pulmonary opacities. No pleural effusion pneumothorax. Thoracic spine degenerative changes. IMPRESSION: No active cardiopulmonary disease. Electronically Signed   By: Lovey Newcomer M.D.   On: 07/17/2016 09:30   Nm Myocar Multi W/spect W/wall Motion / Ef  Result Date: 07/11/2016 CLINICAL DATA:  70 year old male with new onset chest pressure EXAM: MYOCARDIAL IMAGING WITH SPECT (REST AND PHARMACOLOGIC-STRESS) GATED LEFT VENTRICULAR WALL MOTION STUDY LEFT VENTRICULAR EJECTION FRACTION TECHNIQUE: Standard myocardial SPECT imaging was performed after resting intravenous injection of 10 mCi Tc-58m tetrofosmin. Subsequently, intravenous infusion of Lexiscan was performed under the supervision of the Cardiology staff. At peak effect of the drug, 30 mCi Tc-47m tetrofosmin was injected intravenously and standard myocardial SPECT imaging was performed. Quantitative gated imaging was also performed to evaluate left ventricular  wall motion, and estimate left ventricular ejection fraction. COMPARISON:  Chest x-ray 07/10/2016 FINDINGS: Perfusion: No decreased activity in the left ventricle on stress imaging to suggest reversible ischemia. There is a region of mildly attenuated radiotracer activity in the inferolateral wall extending to the true apex both at rest and  during stress suggesting a region of prior infarct or scarring. Diaphragmatic attenuation artifact is also possible. Wall Motion: Normal left ventricular wall motion. No left ventricular dilation. Left Ventricular Ejection Fraction: 57 % End diastolic volume 47 ml End systolic volume 20 ml IMPRESSION: 1. No evidence of reversible ischemia. Moderate region of fixed defect in the inferolateral wall extending to the true apex suggests an area of prior infarct or scarring versus diaphragmatic attenuation artifact. 2. Normal left ventricular wall motion. 3. Left ventricular ejection fraction 57% 4. Non invasive risk stratification*: Low *2012 Appropriate Use Criteria for Coronary Revascularization Focused Update: J Am Coll Cardiol. [id number]. http://content.airportbarriers.com.aspx?articleid=[email or web address] Electronically Signed   By: Jacqulynn Cadet M.D.   On: 07/11/2016 12:37   Dg Chest Portable 1 View  Result Date: 07/10/2016 CLINICAL DATA:  Chest pain and pressure.  Left arm pain. EXAM: PORTABLE CHEST 1 VIEW COMPARISON:  Chest CT 02/25/2016 FINDINGS: The cardiomediastinal contours are normal. The lungs are clear. Pulmonary vasculature is normal. No consolidation, pleural effusion, or pneumothorax. Cortical thickening of the right fourth and left ninth ribs on a CT not well visualized radiographically. There is degenerative change in the spine. IMPRESSION: No acute abnormality. Electronically Signed   By: Jeb Levering M.D.   On: 07/10/2016 02:29    EKG & Cardiac Imaging    EKG:  NSR, HR 77, with RBBB and down-sloping ST in Leads I and II.   CARDIAC  CATHETERIZATION: 07/12/2016  RPDA lesion, 70 %stenosed.  1st RPLB lesion, 80 %stenosed.  LM lesion, 30 %stenosed.  Prox Cx lesion, 30 %stenosed.  Ost 1st Mrg to 1st Mrg lesion, 90 %stenosed.  Dist LAD lesion, 70 %stenosed.  The left ventricular systolic function is normal.  LV end diastolic pressure is mildly elevated.  The left ventricular ejection fraction is 55-65% by visual estimate.  Mid RCA lesion, 50 %stenosed.  Mid LAD lesion, 80 %stenosed.  Prox LAD lesion, 50 %stenosed.   1. Moderate 3 vessel obstructive CAD.    - LAD with 50% ostial, 80% mid, and 70% distal disease.    - 90% very small OM1    - 50% mid RCA, 70% very small PDA, 80% PLOM 2. Good LV function  Plan: recommend aggressive medical therapy. Would continue ASA, amlodipine. Add beta blocker and statin. Patient has only had one episode of chest pain and a low risk Myoview. If he continues to have angina on medical therapy then we should consider him for revascularization with CABG. Will need close follow up as outpatient.   Assessment & Plan    1. Chest pain/ 3-vessel CAD - recently admitted from 07/10/2016 - 07/12/2016 for chest pain and cardiac cath showed 3-vessel CAD (LAD with 50% ostial, 80% mid, and 70% distal disease, 90% very small OM1, 50% mid RCA, 70% very small PDA, 80% PLOM. Aggressive medical therapy was recommended and he was started on ASA, Amlodipine, BB, and a statin. Dr. Gwenlyn Found mentioned that if he continued to have angina while on medical therapy, then he should be considered for revascularization with CABG.  - reports good compliance with his medications but developed recurrent chest pain at 0400 which awoke him from sleep, saying this pain is more intense than previous episodes. Has been waxing and waning since onset but is more severe than during his recent admission.  - initial troponin negative and EKG shows NSR, HR 77, with RBBB and down-sloping ST in Leads I and II. - with his current  pain, will start IV NTG drip.  Continue ASA, statin, BB, and Amlodipine. Cyclic troponin values and if elevation occurs, start IV Heparin.  - while he has only been on medical therapy for one week, the patient is very concerned about his current condition due to his father dying of an MI in his 36's. He wishes to talk with CT Surgery and this certainly seems reasonable with his recent catheterization results. Would recommend further titration of his anti-anginal medications as HR and BP allows and initiation of Imdur once IV NTG able to be discontinued.   2. Dyslipidemia -  Lipid Panel last admission showed total cholesterol 196, Triglycerides 365, LDL 99. - started on Atorvastatin 40mg  daily.  3. Essential HTN - continue Amlodipine and Lopressor. Starting IV NTG for continued chest pain. If BP is stable on drip, would recommend increasing Lopressor dosing.   4. Elevated A1c  - has ranged from 6.8 - 7.0 in the past year - glucose 188 on admission. Not on any medications as an outpatient - SSI while admitted.  5. Leukocytosis - WBC 16.4 on admission. CXR without acute findings.  - repeat CBC in AM.  Signed, Erma Heritage, PA-C 07/17/2016, 11:45 AM Pager: [phone]  Patient seen and discussed with PA Ahmed Prima, I agree with her documentation above. 70 yo male with known CAD with cath 07/12/16  With 70% RPDA, 1st RPLB 80%, OM1 90%, LAD 70%, mid RCA 50% presents with chest pain. Seen recently for same symptoms with cath findings as mentioned, recs were for medical therapy given isolated episode of chest pain, consider CABG if reccurent symptoms. Discharge on medical therapy including antianginal therapy with norvasc 5, toprol XL 12.5mg  daily. LVEF 55-65% by LVgram, do not see echo in system. Trop neg this admit thus far, EKG without acute ischemic changes. 07/2016 nuclear stress with moderate inferolateral defect but no specific ischemia.   Presents today with 2nd episode of chest pain over  the last few weeks. This episode 2/10 in severity, awoke him from sleep. Associated with some SOB, diaphoresis. Constant for a few hours, resolved with SL nitroglycerin. No objective evidence of ischemia. This episode occurred on 2 antianginals at home, Toprol and norvasc. Symptoms concerning that occurred at rest and also recurrent despite antianginal therapy. Will admit for rule out overnight, will ask CT surgery to evaluate.   Zandra Abts MD

## 2016-07-17 NOTE — ED Notes (Signed)
Pt sitting in chair at side of bed. sts feels more comfortable

## 2016-07-17 NOTE — ED Triage Notes (Signed)
Pt. Stated, I started having some chest heaviness last night and I just got out of the hospital on Monday for the same thing.Im also having some SOB

## 2016-07-17 NOTE — Consult Note (Signed)
[address]       [place] [address]             [phone]        Ukiah R Hottenstein [facility] Medical Record [id number] Date of Birth: [birth year]  Referring: Dr Harl Bowie, cath  by Dr Lonia Blood, discharging MD  Dr Gwenlyn Found Primary Cardiology: Dr Stanford Breed  Primary Care: Harrison Mons, PA-C  Chief Complaint:    Chief Complaint  Patient presents with  . Chest Pain  . Shortness of Breath    History of Present Illness:  Patient admitted this am through ER with substernal tightness, denies pain, no nausea or diaphoresis.     He was in the hospital  from 07/10/2016 - 07/12/2016 for chest tightness  as well. His symptoms were thought to have both atypical and typical components, stress test was done was initially. This showed no evidence of reversible ischemia but a moderate region of fixed defect in the inferolateral wall extending to the true apex suggesting an area of prior infarct or scarring versus diaphragmatic attenuation artifact. Dr. Haroldine Laws reviewed this along with a Chest CT which showed 3c coronary calcifications and recommended a cardiac catheterization. Mildly dilated ascending aorta was noted 4.1 cm. The  CT scan was done in march 2017 in Urology office  to RO bony mets from prostate cancer.  Cardiac cath was on 07/12/2016 and showed 3-vessel CAD (LAD with 50% ostial, 80% mid, and 70% distal disease, 90% very small OM1, 50% mid RCA, 70% very small PDA, 80% PLOM. Aggressive medical therapy was recommended and he was started on ASA, Amlodipine, BB, and a statin. The patient is  unsure about his meds but  appears he was stared on statin,  lopressor 12.5 mg bid and Norvasc 5mg  for bp 220/120 on admission. Dr Lonia Blood  recommended  medical therapy, and  if he continued to have angina consider CABG. Since being discharged five days ago, he reports doing well until today . Had been taking his new medications as prescribed without any symptoms. This morning, he developed pain around  0400 which awoke him from sleep. Says it has been constant since but increasing in severity. No association with rest or exertion. Had mild diaphoresis this AM. No nausea, vomiting, or dyspnea.    While in the ED, labs have shown a WBC of 16.4, Hgb 14.5, platelets 282. Electrolytes WNL. Creatinine 1.18. BNP 50.1. Initial troponin negative. CXR with no active cardiopulmonary disease. EKG shows NSR, HR 77, with RBBB and down-sloping ST in Leads I and II.    TNM code: pT3a, pN0 prostate cancer resected 04/2016  Current Activity/ Functional Status: Patient is independent with mobility/ambulation, transfers, ADL's, IADL's.   Zubrod Score: At the time of surgery this patient's most appropriate activity status/level should be described as: []     0    Normal activity, no symptoms [x]     1    Restricted in physical strenuous activity but ambulatory, able to do out light work []     2    Ambulatory and capable of self care, unable to do work activities, up and about                 more than 50%  Of the time                            []     3    Only  limited self care, in bed greater than 50% of waking hours []     4    Completely disabled, no self care, confined to bed or chair []     5    Moribund  Past Medical History:  Diagnosis Date  . Allergy   . Borderline diabetes   . Bronchitis    recurrent  . Fracture, finger    LEFT HAND WITH SWELLING AND BRUISING  . History of gout   . History of kidney stones   . History of skin cancer    possible - pt not sure of dx  . Prostate cancer ([place])   . Sinus infection    recurrent    Past Surgical History:  Procedure Laterality Date  . CARDIAC CATHETERIZATION N/A 07/12/2016   Procedure: Left Heart Cath and Coronary Angiography;  Surgeon: Peter M Martinique, MD;  Location: [facility];  Service: Cardiovascular;  Laterality: N/A;  . EYE SURGERY     B cataracts removed.  Marland Kitchen EYE SURGERY     lens implants  . LITHOTRIPSY    . LYMPHADENECTOMY Bilateral  04/05/2016   Procedure: LYMPHADENECTOMY, BILATERAL PELVIC LYMPHADENECTOMY;  Surgeon: Raynelle Bring, MD;  Location: [facility];  Service: Urology;  Laterality: Bilateral;  . PROSTATE BIOPSY    . ROBOT ASSISTED LAPAROSCOPIC RADICAL PROSTATECTOMY N/A 04/05/2016   Procedure: XI ROBOTIC ASSISTED LAPAROSCOPIC RADICAL PROSTATECTOMY LEVEL 2;  Surgeon: Raynelle Bring, MD;  Location: [facility];  Service: Urology;  Laterality: N/A;  . TONSILLECTOMY AND ADENOIDECTOMY     also received radiation treatments, as was frequent in children at the time  . WISDOM TOOTH EXTRACTION      History  Smoking Status  . Never Smoker  Smokeless Tobacco  . Never Used   History  Alcohol Use  . 1.2 oz/week  . 2 Standard drinks or equivalent per week    Comment: wine - red 2 glasses wine per week    Social History   Social History  . Marital status: Married    Spouse name: [name]  . Number of children: 3  . Years of education: N/A   Occupational History  . 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 Smoking status: Never Smoker  . Smokeless tobacco: Never Used  . Alcohol use 1.2 oz/week    2 Standard drinks or equivalent per week     Comment: wine - red 2 glasses wine per week  . Drug use: No  . Sexual activity: Yes    Partners: Female   Other Topics Concern  . Not on file   Social History Narrative   Marital status: married x 25 years; second marriage; first marriage x 12 years.      Children: 3 daughters and one step-daughter; 4 grandchildren      Lives: with wife.      Employment: semi-retired; Company secretary; professor; Social worker, Probation officer.      Tobacco:  no      Alcohol: about 2 glasses of wine/week      Exercise: active in the yard.    Allergies  Allergen Reactions  . Penicillins Swelling and Other (See Comments)    As a child. Has patient had a PCN reaction causing immediate rash, facial/tongue/throat swelling,  SOB or lightheadedness with hypotension: Yes Has patient had a PCN reaction causing severe rash involving mucus membranes or skin necrosis: No Has patient had a PCN reaction that  required hospitalization: Was already in hospital when reaction happened Has patient had a PCN reaction occurring within the last 10 years: No  If all of the above answers are "NO", then may proceed with Cephalosporin use.     Current Facility-Administered Medications  Medication Dose Route Frequency Provider Last Rate Last Dose  . insulin aspart (novoLOG) injection 0-15 Units  0-15 Units Subcutaneous TID WC Erma Heritage, [place]   5 Units at 07/17/16 1225  . nitroGLYCERIN (NITROSTAT) SL tablet 0.4 mg  0.4 mg Sublingual Q5 min PRN Erma Heritage, PA   0.4 mg at 07/17/16 1108  . nitroGLYCERIN 50 mg in dextrose 5 % 250 mL (0.2 mg/mL) infusion  0-30 mcg/min Intravenous Titrated Erma Heritage, PA 1.5 mL/hr at 07/17/16 1218 5 mcg/min at 07/17/16 1218   Current Outpatient Prescriptions  Medication Sig Dispense Refill  . amLODipine (NORVASC) 5 MG tablet Take 1 tablet (5 mg total) by mouth daily. 30 tablet 0  . aspirin EC 81 MG EC tablet Take 1 tablet (81 mg total) by mouth daily.    Marland Kitchen atorvastatin (LIPITOR) 40 MG tablet Take 1 tablet (40 mg total) by mouth daily at 6 PM. 30 tablet 0  . KRILL OIL PO Take 1 capsule by mouth daily.    . metoprolol succinate (TOPROL-XL) 25 MG 24 hr tablet Take 0.5 tablets (12.5 mg total) by mouth daily. 30 tablet 0     (Not in a hospital admission)  Family History  Problem Relation Age of Onset  . Heart attack Father   . Heart disease Father 65    AMI as cause of death  . Diabetes Father   . Hyperlipidemia Father   . Parkinson's disease Brother   . Diabetes Brother   . Cancer Brother     melanoma retina  . Heart disease Brother   . Stroke Maternal Grandmother   . Cancer Mother 23    Hodgkins   . Glaucoma Mother   . Colon cancer Neg Hx   . Colon polyps Neg Hx       Review of Systems:      Cardiac Review of Systems: Y or N  Chest Pain [  y  ]  Resting SOB [  n ] Exertional SOB  [n  ]  Orthopnea [n  ]   Pedal Edema [  n ]    Palpitations [ n ] Syncope  [ n ]   Presyncope [n  ]  General Review of Systems: [Y] = yes [  ]=no Constitional: recent weight change [ n ]; anorexia [  ]; fatigue [ y ]; nausea [  ]; night sweats [  ]; fever [  ]; or chills [  ]                                                               Dental: poor dentition[  ]; Last Dentist visit:   Eye : blurred vision [  ]; diplopia [   ]; vision changes [  ];  Amaurosis fugax[  ]; Resp: cough [ n ];  wheezing[  ];  hemoptysis[n  ]; shortness of breath[ n ]; paroxysmal nocturnal dyspnea[  ]; dyspnea on exertion[  ]; or orthopnea[  ];  GI:  gallstones[  ],  vomiting[  ];  dysphagia[  ]; melena[  ];  hematochezia [  ]; heartburn[  ];   Hx of  Colonoscopy[  ]; GU: kidney stones [  ]; hematuria[  ];   dysuria [  ];  nocturia[  ];  history of     obstruction [  ]; urinary frequency [  ]             Skin: rash, swelling[  ];, hair loss[  ];  peripheral edema[  ];  or itching[  ]; Musculosketetal: myalgias[  ];  joint swelling[  ];  joint erythema[  ];  joint pain[  ];  back pain[  ];  Heme/Lymph: bruising[  ];  bleeding[  ];  anemia[  ];  Neuro: TIA[  n];  headaches[  ];  stroke[  ];  vertigo[  ];  seizures[  ];   paresthesias[  ];  difficulty walking[n  ];  Psych:depression[  ]; anxiety[  ];  Endocrine: diabetes[  ];  thyroid dysfunction[  ];  Immunizations: Flu [  ]; Pneumococcal[  ];  Other:  Physical Exam: BP 139/77   Pulse 72   Temp 98.4 F (36.9 C) (Oral)   Resp 17   Ht 5\' 9"  (1.753 m)   Wt 206 lb (93.4 kg)   SpO2 98%   BMI 30.42 kg/m    General appearance: cooperative, appears stated age and mildly confused, had trouble with details of the last 10 days , did not know what meds hes was taking. Thaought the symptoms were because he took 5 324 asa at one time last week Head:  Normocephalic, without obvious abnormality, atraumatic Neck: no adenopathy, no carotid bruit, no JVD, supple, symmetrical, trachea midline and thyroid not enlarged, symmetric, no tenderness/mass/nodules Lymph nodes: Cervical, supraclavicular, and axillary nodes normal. Resp: clear to auscultation bilaterally Back: symmetric, no curvature. ROM normal. No CVA tenderness. Cardio: regular rate and rhythm, S1, S2 normal, no murmur, click, rub or gallop GI: soft, non-tender; bowel sounds normal; no masses,  no organomegaly Extremities: extremities normal, atraumatic, no cyanosis or edema and Homans sign is negative, no sign of DVT Neurologic: Mental status: Alert, oriented, thought content appropriate, alertness: alert, orientation: intact, as above mildly confused about details   Diagnostic Studies & Laboratory data:     Recent Radiology Findings:   Dg Chest 2 View  Result Date: 07/17/2016 CLINICAL DATA:  Patient with chest pain and pressure. EXAM: CHEST  2 VIEW COMPARISON:  Chest radiograph 07/10/2016 FINDINGS: Normal cardiac and mediastinal contours. No consolidative pulmonary opacities. No pleural effusion pneumothorax. Thoracic spine degenerative changes. IMPRESSION: No active cardiopulmonary disease. Electronically Signed   By: Lovey Newcomer M.D.   On: 07/17/2016 09:30     I have independently reviewed the above radiologic studies.  Recent Lab Findings: Lab Results  Component Value Date   WBC 16.4 (H) 07/17/2016   HGB 14.5 07/17/2016   HCT 43.4 07/17/2016   PLT 282 07/17/2016   GLUCOSE 188 (H) 07/17/2016   CHOL 196 07/10/2016   TRIG 365 (H) 07/10/2016   HDL 24 (L) 07/10/2016   LDLCALC 99 07/10/2016   ALT 18 07/10/2016   AST 10 (L) 07/10/2016   NA 136 07/17/2016   K 4.3 07/17/2016   CL 101 07/17/2016   CREATININE 1.18 07/17/2016   BUN 17 07/17/2016   CO2 25 07/17/2016   INR 1.04 07/11/2016   HGBA1C 6.8 (H) 07/10/2016   CT of chest 02/2016: Final Report  CLINICAL DATA:  Followup rib abnormality seen on recent bone scan.  EXAM: CT CHEST WITHOUT CONTRAST  TECHNIQUE: Multidetector CT imaging of the chest was performed following the standard protocol without IV contrast.  COMPARISON: Whole bone scan 02/19/2016  FINDINGS: Mediastinum/Nodes: No chest wall mass, supraclavicular or axillary lymphadenopathy.  The heart is normal in size. No pericardial effusion. 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 Scattered aortic calcifications.  Three-vessel coronary artery calcifications.  No mediastinal or hilar mass or adenopathy. Esophagus is grossly normal.  Lungs/Pleura: No acute pulmonary findings. No worrisome pulmonary lesions. No pleural effusion.  Upper abdomen: No significant upper abdominal findings.  Musculoskeletal: The abnormalities the on the bone scan correlate 2 areas of marked cortical thickening involving the fourth right posterior rib and also the ninth left posterior rib. This is not have the appearance of prostate cancer. This is most likely some type of hyperostotic process and could be related to remote trauma. Paget's disease would be another possibility. There are also diffuse degenerative changes involving the thoracic spine with osteophytosis and DISH.  Severe degenerate disc disease is barely covered at L2-3 but may account for the abnormality on the bone scan.  IMPRESSION: 1. The findings on the bone scan are un likely due to prostate cancer. This is more likely remote trauma or other hyperostotic process such as Paget's disease. 2. 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 3. No findings suspicious for metastatic prostate cancer. 4. Three vessel coronary artery calcifications.   Electronically Signed By: Marijo Sanes M.D. On: 02/25/2016 17:27  Chronic Kidney Disease   Stage I     GFR >90  Stage II    GFR 60-89  Stage IIIA GFR 45-59  Stage  IIIB GFR 30-44  Stage IV   GFR 15-29  Stage V    GFR  <15  Lab Results  Component Value Date   CREATININE 1.18 07/17/2016   Estimated Creatinine Clearance: 66.2 mL/min (by C-G formula based on SCr of 1.18 mg/dL).    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  3/ recent prostatectomy for : pT3a, pN0 prostate cancer resected 04/2016 4/ stage II chronic Kidney disease  5/ elevated WBC to 16,000- was 8,000 last week- unknown etiology- no symptoms of infection , respiratory or urinary - will chesk ua    I  spent 40 minutes counseling the patient face to face and 50% or more the  time was spent in counseling and coordination of care. The total time spent in the appointment was 60 minutes.    Grace Isaac MD      [address] [place] [address] Office [phone]   Beeper [phone]  07/17/2016 12:54 PM

## 2016-07-17 NOTE — ED Notes (Signed)
Ordered heart healthy meal tray for pt. Per PA, pt can eat at this time

## 2016-07-18 DIAGNOSIS — R079 Chest pain, unspecified: Secondary | ICD-10-CM | POA: Diagnosis not present

## 2016-07-18 DIAGNOSIS — I25119 Atherosclerotic heart disease of native coronary artery with unspecified angina pectoris: Secondary | ICD-10-CM | POA: Diagnosis not present

## 2016-07-18 DIAGNOSIS — I10 Essential (primary) hypertension: Secondary | ICD-10-CM | POA: Diagnosis not present

## 2016-07-18 DIAGNOSIS — I2510 Atherosclerotic heart disease of native coronary artery without angina pectoris: Secondary | ICD-10-CM

## 2016-07-18 DIAGNOSIS — I209 Angina pectoris, unspecified: Secondary | ICD-10-CM | POA: Diagnosis not present

## 2016-07-18 DIAGNOSIS — E785 Hyperlipidemia, unspecified: Secondary | ICD-10-CM | POA: Diagnosis not present

## 2016-07-18 LAB — CBC
HEMATOCRIT: 39.3 % (ref 39.0–52.0)
HEMOGLOBIN: 13.4 g/dL (ref 13.0–17.0)
MCH: 31.4 pg (ref 26.0–34.0)
MCHC: 34.1 g/dL (ref 30.0–36.0)
MCV: 92 fL (ref 78.0–100.0)
Platelets: 252 10*3/uL (ref 150–400)
RBC: 4.27 MIL/uL (ref 4.22–5.81)
RDW: 12.4 % (ref 11.5–15.5)
WBC: 16.5 10*3/uL — ABNORMAL HIGH (ref 4.0–10.5)

## 2016-07-18 LAB — URINALYSIS, ROUTINE W REFLEX MICROSCOPIC
Bilirubin Urine: NEGATIVE
Glucose, UA: NEGATIVE mg/dL
Hgb urine dipstick: NEGATIVE
Ketones, ur: NEGATIVE mg/dL
Leukocytes, UA: NEGATIVE
Nitrite: NEGATIVE
Protein, ur: NEGATIVE mg/dL
Specific Gravity, Urine: 1.014 (ref 1.005–1.030)
pH: 5.5 (ref 5.0–8.0)

## 2016-07-18 LAB — BASIC METABOLIC PANEL
ANION GAP: 11 (ref 5–15)
BUN: 13 mg/dL (ref 6–20)
CALCIUM: 8.7 mg/dL — AB (ref 8.9–10.3)
CO2: 24 mmol/L (ref 22–32)
Chloride: 101 mmol/L (ref 101–111)
Creatinine, Ser: 1.13 mg/dL (ref 0.61–1.24)
GFR calc Af Amer: >60 mL/min (ref >60)
GFR calc non Af Amer: >60 mL/min (ref >60)
GLUCOSE: 181 mg/dL — AB (ref 65–99)
Potassium: 3.5 mmol/L (ref 3.5–5.1)
Sodium: 136 mmol/L (ref 135–145)

## 2016-07-18 LAB — GLUCOSE, CAPILLARY
GLUCOSE-CAPILLARY: 165 mg/dL — AB (ref 65–99)
Glucose-Capillary: 173 mg/dL — ABNORMAL HIGH (ref 65–99)
Glucose-Capillary: 146 mg/dL — ABNORMAL HIGH (ref 65–99)
Glucose-Capillary: 164 mg/dL — ABNORMAL HIGH (ref 65–99)

## 2016-07-18 LAB — TROPONIN I

## 2016-07-18 MED ORDER — METOPROLOL SUCCINATE ER 25 MG PO TB24
12.5000 mg | ORAL_TABLET | Freq: Once | ORAL | Status: AC
  Administered 2016-07-18: 12.5 mg via ORAL
  Filled 2016-07-18: qty 1

## 2016-07-18 MED ORDER — METOPROLOL SUCCINATE ER 25 MG PO TB24
25.0000 mg | ORAL_TABLET | Freq: Every day | ORAL | Status: DC
  Administered 2016-07-19: 25 mg via ORAL
  Filled 2016-07-18: qty 1

## 2016-07-18 NOTE — Progress Notes (Signed)
Patient refusing to allow bed alarm to remain on, patient turns the alarm off. Pt also refusing to wear yellow, non-slip, fall precaution socks. Educated on fall risk.

## 2016-07-18 NOTE — Progress Notes (Signed)
       Patient Name: Lance Stevens Date of Encounter: 07/18/2016    SUBJECTIVE: The patient is very anxious about his condition. He's had no recurrence of chest discomfort on minimal nitroglycerin. Coronary angiography was performed less than 2 weeks ago. Until he was awakened with discomfort in the early hours of yesterday morning he had been asymptomatic since discharge.  TELEMETRY:  Normal sinus rhythm Vitals:   07/18/16 0700 07/18/16 0736 07/18/16 0800 07/18/16 1148  BP: 113/70  125/76   Pulse: 64  74   Resp:  17  17  Temp:  98.3 F (36.8 C)  98.8 F (37.1 C)  TempSrc:  Oral  Oral  SpO2: 94%  94%   Weight:      Height:        Intake/Output Summary (Last 24 hours) at 07/18/16 1219 Last data filed at 07/18/16 0929  Gross per 24 hour  Intake             1581 ml  Output              300 ml  Net             1281 ml   LABS: Basic Metabolic Panel:  Recent Labs  07/17/16 0915 07/18/16 0322  NA 136 136  K 4.3 3.5  CL 101 101  CO2 25 24  GLUCOSE 188* 181*  BUN 17 13  CREATININE 1.18 1.13  CALCIUM 9.2 8.7*   CBC:  Recent Labs  07/17/16 0915 07/18/16 0322  WBC 16.4* 16.5*  HGB 14.5 13.4  HCT 43.4 39.3  MCV 92.5 92.0  PLT 282 252   Cardiac Enzymes:  Recent Labs  07/17/16 1514 07/17/16 2127 07/18/16 0322  TROPONINI 0.03* <0.03 <0.03   Radiology/Studies:  Digital coronary angiogram images were personally reviewed.  Significant coronary disease in the distal right coronary territory involving a PDA and LV branch. There is intermediate disease in the mid RCA.  Severe mid LAD disease. Moderately severe ostial LAD disease.  Moderately severe stenosis in each of 2 small obtuse marginal branches.  ECG: Right bundle branch block with no ischemic ST abnormality noted  Physical Exam: Blood pressure 125/76, pulse 74, temperature 98.8 F (37.1 C), temperature source Oral, resp. rate 17, height 5\' 9"  (1.753 m), weight 204 lb (92.5 kg), SpO2 94 %. Weight  change:   Wt Readings from Last 3 Encounters:  07/18/16 204 lb (92.5 kg)  07/12/16 206 lb 1.6 oz (93.5 kg)  04/05/16 217 lb (98.4 kg)   Exam is normal  ASSESSMENT:  1. Multivessel coronary artery disease with moderately severe three-vessel involvement, most of which is relatively distal disease 2. Awakened with vague chest discomfort which led to anxiety and emergency room evaluation. So far he is ruled out for myocardial infarction. 3. Hypertension controlled 4. Hyperlipidemia on statin therapy.  Plan:  The patient is very anxious. He wants to try medical therapy if symptoms can be controlled.  Discontinue IV nitroglycerin. Up titrate antianginal regimen.  Observe in hospital overnight.  Final decision with reference to bypass surgery will occur after Dr. Martinique and [name] have an opportunity to speak.  Signed, Belva Crome III 07/18/2016, 12:19 PM

## 2016-07-19 ENCOUNTER — Encounter (HOSPITAL_COMMUNITY): Payer: Self-pay | Admitting: Physician Assistant

## 2016-07-19 ENCOUNTER — Inpatient Hospital Stay (HOSPITAL_COMMUNITY): Payer: Medicare Other

## 2016-07-19 DIAGNOSIS — I209 Angina pectoris, unspecified: Secondary | ICD-10-CM

## 2016-07-19 DIAGNOSIS — R079 Chest pain, unspecified: Secondary | ICD-10-CM

## 2016-07-19 DIAGNOSIS — I10 Essential (primary) hypertension: Secondary | ICD-10-CM | POA: Diagnosis not present

## 2016-07-19 DIAGNOSIS — I25110 Atherosclerotic heart disease of native coronary artery with unstable angina pectoris: Secondary | ICD-10-CM | POA: Diagnosis not present

## 2016-07-19 DIAGNOSIS — E785 Hyperlipidemia, unspecified: Secondary | ICD-10-CM | POA: Diagnosis not present

## 2016-07-19 DIAGNOSIS — I25119 Atherosclerotic heart disease of native coronary artery with unspecified angina pectoris: Secondary | ICD-10-CM | POA: Diagnosis not present

## 2016-07-19 DIAGNOSIS — I2510 Atherosclerotic heart disease of native coronary artery without angina pectoris: Secondary | ICD-10-CM | POA: Diagnosis not present

## 2016-07-19 LAB — ECHOCARDIOGRAM COMPLETE
HEIGHTINCHES: 69 in
Weight: 3235.2 oz

## 2016-07-19 LAB — GLUCOSE, CAPILLARY
GLUCOSE-CAPILLARY: 140 mg/dL — AB (ref 65–99)
GLUCOSE-CAPILLARY: 178 mg/dL — AB (ref 65–99)
Glucose-Capillary: 126 mg/dL — ABNORMAL HIGH (ref 65–99)

## 2016-07-19 MED ORDER — NITROGLYCERIN 0.4 MG SL SUBL: 0.4000 mg | SUBLINGUAL_TABLET | SUBLINGUAL | 2 refills | Status: DC | PRN

## 2016-07-19 MED ORDER — ACETAMINOPHEN 325 MG PO TABS: 650.0000 mg | ORAL_TABLET | ORAL | Status: DC | PRN

## 2016-07-19 MED ORDER — ISOSORBIDE MONONITRATE ER 30 MG PO TB24: 30.0000 mg | ORAL_TABLET | Freq: Every day | ORAL | Status: DC

## 2016-07-19 MED ORDER — METOPROLOL SUCCINATE ER 25 MG PO TB24: 25.0000 mg | ORAL_TABLET | Freq: Every day | ORAL | 0 refills | Status: DC

## 2016-07-19 MED ORDER — ISOSORBIDE MONONITRATE ER 30 MG PO TB24: 15.0000 mg | ORAL_TABLET | Freq: Every day | ORAL | 3 refills | Status: DC

## 2016-07-19 MED ORDER — PERFLUTREN LIPID MICROSPHERE
INTRAVENOUS | Status: AC
  Administered 2016-07-19: 2 mL
  Filled 2016-07-19: qty 10

## 2016-07-19 MED ORDER — ISOSORBIDE MONONITRATE ER 30 MG PO TB24
15.0000 mg | ORAL_TABLET | Freq: Every day | ORAL | Status: DC
  Administered 2016-07-19: 15 mg via ORAL
  Filled 2016-07-19: qty 1

## 2016-07-19 NOTE — Progress Notes (Signed)
Patient ID: NATE BERROCAL, male   DOB: [birth year], 70 y.o.   MRN: [id number]      [address]       [place] [address]             [phone]                     LOS: 2 days   Subjective: Patient with out chest pain today, on beta blocker and started Imdur   Objective: Vital signs in last 24 hours: Patient Vitals for the past 24 hrs:  BP Temp Temp src Pulse Resp SpO2 Weight  07/19/16 1648 122/72 98.6 F (37 C) Oral 72 19 100 % -  07/19/16 1212 121/68 98.2 F (36.8 C) Oral 73 18 99 % -  07/19/16 0759 - 98.5 F (36.9 C) Oral 68 15 98 % -  07/19/16 0554 114/60 98.2 F (36.8 C) Oral 60 16 98 % 202 lb 3.2 oz (91.7 kg)  07/19/16 0021 (!) 115/52 98.7 F (37.1 C) Oral 67 16 96 % -  07/18/16 2043 124/78 98 F (36.7 C) Oral 65 18 100 % -  07/18/16 1900 - - - 65 - 96 % -    Filed Weights   07/17/16 1500 07/18/16 0432 07/19/16 0554  Weight: 202 lb 8 oz (91.9 kg) 204 lb (92.5 kg) 202 lb 3.2 oz (91.7 kg)    Hemodynamic parameters for last 24 hours:    Intake/Output from previous day: 08/13 0701 - 08/14 0700 In: 1681.5 [P.O.:1680; I.V.:1.5] Out: 700 [Urine:700] Intake/Output this shift: Total I/O In: 960 [P.O.:960] Out: 400 [Urine:400]  Scheduled Meds: . amLODipine  5 mg Oral Daily  . aspirin EC  81 mg Oral Daily  . atorvastatin  40 mg Oral q1800  . enoxaparin (LOVENOX) injection  40 mg Subcutaneous Q24H  . insulin aspart  0-15 Units Subcutaneous TID WC  . isosorbide mononitrate  15 mg Oral Daily  . metoprolol succinate  25 mg Oral Daily   Continuous Infusions:  PRN Meds:.acetaminophen, nitroGLYCERIN, ondansetron (ZOFRAN) IV  General appearance: alert and cooperative Neurologic: intact Heart: regular rate and rhythm, S1, S2 normal, no murmur, click, rub or gallop Lungs: clear to auscultation bilaterally Abdomen: soft, non-tender; bowel sounds normal; no masses,  no organomegaly Extremities: extremities normal, atraumatic, no cyanosis or edema and  Homans sign is negative, no sign of DVT  Lab Results: CBC: Recent Labs  07/17/16 0915 07/18/16 0322  WBC 16.4* 16.5*  HGB 14.5 13.4  HCT 43.4 39.3  PLT 282 252   BMET:  Recent Labs  07/17/16 0915 07/18/16 0322  NA 136 136  K 4.3 3.5  CL 101 101  CO2 25 24  GLUCOSE 188* 181*  BUN 17 13  CREATININE 1.18 1.13  CALCIUM 9.2 8.7*    PT/INR: No results for input(s): LABPROT, INR in the last 72 hours.   Radiology No results found.   Assessment/Plan: I have had 45 min conversation with patient and wife about coronary anatomy and symptoms. I have reviewed situation with Dr Burt Knack in Dr Lonia Blood absence. I have recommended proceeding with CABG. Will see patient in office Thursday for any follow up questions and plan surgery Friday. Explination of risks and options discussed .  I  spent 40 minutes counseling the patient face to face and 50% or more the  time was spent in counseling and coordination of care. The total time spent in the appointment was 60 minutes.  Percell Miller  Maryruth Bun MD 07/19/2016 6:04 PM

## 2016-07-19 NOTE — Care Management Note (Signed)
Case Management Note  Patient Details  Name: Lance Stevens MRN: [id number] Date of Birth: 1946/09/01  Subjective/Objective:     Patient is from home with wife, pta indep.  He had a cath on 8/7 and was txd with medications.  He has a PCP, Emilie Rutter at [facility].  He has medication coverage.   He has 3 vessel dz , per cards not a a surgical candidate.  NCM will cont to follow for dc needs.               Action/Plan:   Expected Discharge Date:                  Expected Discharge Plan:  Home/Self Care  In-House Referral:     Discharge planning Services  CM Consult  Post Acute Care Choice:    Choice offered to:     DME Arranged:    DME Agency:     HH Arranged:    HH Agency:     Status of Service:  In process, will continue to follow  If discussed at Long Length of Stay Meetings, dates discussed:    Additional Comments:  Zenon Mayo, RN 07/19/2016, 12:08 PM

## 2016-07-19 NOTE — Discharge Summary (Signed)
Patient ID: Lance Stevens,  MRN: [id number], DOB/AGE: 02/16/1946 70 y.o.  Admit date: 07/17/2016 Discharge date: 07/19/2016  Primary Care Provider: Harrison Mons, PA-C Primary Cardiologist: Dr Stanford Breed  Discharge Diagnoses Principal Problem:   Angina pectoris [facility]) Active Problems:   Diabetes mellitus type 2 in nonobese [facility])   CKD (chronic kidney disease), stage II   Dyslipidemia   CAD (coronary artery disease)   Chest pain   Essential hypertension    Hospital Course:  Lance Stevens is a 70 year old man with history of multivessel CAD, HTN, HLD, CKD stage II, and prostate cancer s/p radical prostatectomy 04/2016 who presents to [name] ED on 07/17/2016 for evaluation of chest pain.   He was recently admitted from 07/10/2016 - 07/12/2016 for chest pain. His symptoms were thought to have both atypical and typical components. He underwent nuclear stress test which was abnormal - he subsequently underwent LHC 07/12/2016 showing 3-vessel CAD (LAD with 50% ostial, 80% mid, and 70% distal disease, 90% very small OM1, 50% mid RCA, 70% very small PDA, 80% PLOM). Aggressive medical therapy was recommended and he was started on ASA, amlodipine, BB, and a statin. It was recommended that if he continued to have angina while on medical therapy, then he should be considered for revascularization with CABG.   He returned 07/17/16 with chest pain. The night before, he felt as though he had over-eaten a meal. He had some discomfort afterwards.  Several hours later around 4am, he had chest pain, similar to prior angina, that woke up from sleep associated with mild diaphoresis. Due to his symptoms he presented back tot he ER. Labs showed WBC of 16.4, Hgb 14.5, platelets 282. Electrolytes WNL. Creatinine 1.18. BNP 50.1. Initial troponin was negative. CXR showed no active cardiopulmonary disease. EKG showed NSR, HR 77, with RBBB and down-sloping ST in leads I and II. 2nd troponin was 0.03, the next two were  <0.03. UA was clear and there was no specific source for his leukocytosis. He was treated with IV NTG which was then transitioned to oral NTG. He had no further chest pain.  He did express frustration over getting information in bits and pieces. We spent time listening to the patient's concerns and acknowledging the difficulty of receiving care from multiple fronts. [name] saw the patient in consult to further discuss recommendations, who reviewed the case with interventional cardiologist Dr Burt Knack. The plan is for the pt to return later this week for CABG.   Discharge Vitals:  Blood pressure 122/72, pulse 72, temperature 98.6 F (37 C), temperature source Oral, resp. rate 19, height 5\' 9"  (1.753 m), weight 202 lb 3.2 oz (91.7 kg), SpO2 100 %.    Labs: Results for orders placed or performed during the hospital encounter of 07/17/16 (from the past 24 hour(s))  Glucose, capillary     Status: Abnormal   Collection Time: 07/18/16  8:40 PM  Result Value Ref Range   Glucose-Capillary 164 (H) 65 - 99 mg/dL  Glucose, capillary     Status: Abnormal   Collection Time: 07/19/16  7:45 AM  Result Value Ref Range   Glucose-Capillary 140 (H) 65 - 99 mg/dL  Glucose, capillary     Status: Abnormal   Collection Time: 07/19/16 11:04 AM  Result Value Ref Range   Glucose-Capillary 178 (H) 65 - 99 mg/dL  Glucose, capillary     Status: Abnormal   Collection Time: 07/19/16  4:22 PM  Result Value Ref Range   Glucose-Capillary  126 (H) 65 - 99 mg/dL    Disposition: Dr Arsenio Katz will contact you   Discharge Medications:    Medication List    STOP taking these medications   KRILL OIL PO     TAKE these medications   acetaminophen 325 MG tablet Commonly known as:  TYLENOL Take 2 tablets (650 mg total) by mouth every 4 (four) hours as needed for headache or mild pain.   amLODipine 5 MG tablet Commonly known as:  NORVASC Take 1 tablet (5 mg total) by mouth daily.   aspirin 81 MG EC  tablet Take 1 tablet (81 mg total) by mouth daily.   atorvastatin 40 MG tablet Commonly known as:  LIPITOR Take 1 tablet (40 mg total) by mouth daily at 6 PM.   isosorbide mononitrate 30 MG 24 hr tablet Commonly known as:  IMDUR Take 0.5 tablets (15 mg total) by mouth daily.   metoprolol succinate 25 MG 24 hr tablet Commonly known as:  TOPROL-XL Take 1 tablet (25 mg total) by mouth daily. What changed:  how much to take   nitroGLYCERIN 0.4 MG SL tablet Commonly known as:  NITROSTAT Place 1 tablet (0.4 mg total) under the tongue every 5 (five) minutes as needed for chest pain.        Duration of Discharge Encounter: Greater than 30 minutes including physician time.  Angelena Form PA-C 07/19/2016 6:08 PM

## 2016-07-19 NOTE — Discharge Instructions (Signed)
Return to the hospital if you have recurrent chest pain

## 2016-07-19 NOTE — Progress Notes (Signed)
Patient: Lance Stevens / Admit Date: 07/17/2016 / Date of Encounter: 07/19/2016, 10:10 AM   Subjective: He feels well this AM - no CP or SOB. His major concern is that he feels rushed while here in the hospital, only getting info in 5 min increments. Expresses desire to sit down with his cardiologist and PCP to discuss options. He didn't know that Dr. Stanford Breed was his cardiologist. He has been cared for by several members of our team during admission.  Objective: Telemetry: NSR occ PACS Physical Exam: Blood pressure 114/60, pulse 68, temperature 98.5 F (36.9 C), temperature source Oral, resp. rate 15, height 5\' 9"  (1.753 m), weight 202 lb 3.2 oz (91.7 kg), SpO2 98 %. General: Well developed, well nourished WM, in no acute distress. Head: Normocephalic, atraumatic, sclera non-icteric, no xanthomas, nares are without discharge. Neck: Negative for carotid bruits. JVP not elevated. Lungs: Clear bilaterally to auscultation without wheezes, rales, or rhonchi. Breathing is unlabored. Heart: RRR S1 S2 without murmurs, rubs, or gallops.  Abdomen: Soft, non-tender, non-distended with normoactive bowel sounds. No rebound/guarding. Extremities: No clubbing or cyanosis. No edema. Distal pedal pulses are 2+ and equal bilaterally. Neuro: Alert and oriented X 3. Moves all extremities spontaneously. Psych:  Responds to questions appropriately with a normal affect.   Intake/Output Summary (Last 24 hours) at 07/19/16 1010 Last data filed at 07/19/16 0838  Gross per 24 hour  Intake             1440 ml  Output              800 ml  Net              640 ml    Inpatient Medications:  . amLODipine  5 mg Oral Daily  . aspirin EC  81 mg Oral Daily  . atorvastatin  40 mg Oral q1800  . enoxaparin (LOVENOX) injection  40 mg Subcutaneous Q24H  . insulin aspart  0-15 Units Subcutaneous TID WC  . isosorbide mononitrate  30 mg Oral Daily  . metoprolol succinate  25 mg Oral Daily   Infusions:     Labs:  Recent Labs  07/17/16 0915 07/18/16 0322  NA 136 136  K 4.3 3.5  CL 101 101  CO2 25 24  GLUCOSE 188* 181*  BUN 17 13  CREATININE 1.18 1.13  CALCIUM 9.2 8.7*   No results for input(s): AST, ALT, ALKPHOS, BILITOT, PROT, ALBUMIN in the last 72 hours.  Recent Labs  07/17/16 0915 07/18/16 0322  WBC 16.4* 16.5*  HGB 14.5 13.4  HCT 43.4 39.3  MCV 92.5 92.0  PLT 282 252    Recent Labs  07/17/16 1514 07/17/16 2127 07/18/16 0322  TROPONINI 0.03* <0.03 <0.03   Invalid input(s): POCBNP No results for input(s): HGBA1C in the last 72 hours.   Radiology/Studies:  Dg Chest 2 View  Result Date: 07/17/2016 CLINICAL DATA:  Patient with chest pain and pressure. EXAM: CHEST  2 VIEW COMPARISON:  Chest radiograph 07/10/2016 FINDINGS: Normal cardiac and mediastinal contours. No consolidative pulmonary opacities. No pleural effusion pneumothorax. Thoracic spine degenerative changes. IMPRESSION: No active cardiopulmonary disease. Electronically Signed   By: Lovey Newcomer M.D.   On: 07/17/2016 09:30   Nm Myocar Multi W/spect W/wall Motion / Ef  Result Date: 07/11/2016 CLINICAL DATA:  70 year old male with new onset chest pressure EXAM: MYOCARDIAL IMAGING WITH SPECT (REST AND PHARMACOLOGIC-STRESS) GATED LEFT VENTRICULAR WALL MOTION STUDY LEFT VENTRICULAR EJECTION FRACTION TECHNIQUE: Standard myocardial SPECT imaging was performed  after resting intravenous injection of 10 mCi Tc-9m tetrofosmin. Subsequently, intravenous infusion of Lexiscan was performed under the supervision of the Cardiology staff. At peak effect of the drug, 30 mCi Tc-25m tetrofosmin was injected intravenously and standard myocardial SPECT imaging was performed. Quantitative gated imaging was also performed to evaluate left ventricular wall motion, and estimate left ventricular ejection fraction. COMPARISON:  Chest x-ray 07/10/2016 FINDINGS: Perfusion: No decreased activity in the left ventricle on stress imaging to  suggest reversible ischemia. There is a region of mildly attenuated radiotracer activity in the inferolateral wall extending to the true apex both at rest and during stress suggesting a region of prior infarct or scarring. Diaphragmatic attenuation artifact is also possible. Wall Motion: Normal left ventricular wall motion. No left ventricular dilation. Left Ventricular Ejection Fraction: 57 % End diastolic volume 47 ml End systolic volume 20 ml IMPRESSION: 1. No evidence of reversible ischemia. Moderate region of fixed defect in the inferolateral wall extending to the true apex suggests an area of prior infarct or scarring versus diaphragmatic attenuation artifact. 2. Normal left ventricular wall motion. 3. Left ventricular ejection fraction 57% 4. Non invasive risk stratification*: Low *2012 Appropriate Use Criteria for Coronary Revascularization Focused Update: J Am Coll Cardiol. N6492421. http://content.airportbarriers.com.aspx?articleid=[email or web address] Electronically Signed   By: Jacqulynn Cadet M.D.   On: 07/11/2016 12:37   Dg Chest Portable 1 View  Result Date: 07/10/2016 CLINICAL DATA:  Chest pain and pressure.  Left arm pain. EXAM: PORTABLE CHEST 1 VIEW COMPARISON:  Chest CT 02/25/2016 FINDINGS: The cardiomediastinal contours are normal. The lungs are clear. Pulmonary vasculature is normal. No consolidation, pleural effusion, or pneumothorax. Cortical thickening of the right fourth and left ninth ribs on a CT not well visualized radiographically. There is degenerative change in the spine. IMPRESSION: No acute abnormality. Electronically Signed   By: Jeb Levering M.D.   On: 07/10/2016 02:29     Assessment and Plan  70M with multivessel CAD, HTN, HLD, prostate CA s/p resection, CKD stage II, DM who presented back to [facility] after recurrent chest pain that occurred after over-eating. LHC 07/12/16 with 3V CAD, recommendation for med rx, considering CABG if he had angina while on medical  therapy.  1. CAD with recurrent angina - spent a lot of time listening to patient's concerns and acknowledging difficulty of receiving care on multiple fronts. I explained there have been a lot of discussions behind the scenes as well and that we will be touching base with [name] to further discuss his status. Add Imdur 15mg  daily. Lower dose chosen due to softer BP.  2. Leukocytosis - UA, CXR neg. Question reactive demargination. F/u in AM if still inpatient.  3. HTN - controlled.  Signed, Melina Copa PA-C Pager: [phone]

## 2016-07-19 NOTE — Progress Notes (Signed)
Just discussed disposition on patient with [name] who completed a lengthy conversation with the patient.  Films reviewed with Dr. Burt Knack and patient is not a candidate for PCI due to diffuse disease. [name] plans to perform CABG on Friday and will see him in the office on this Thursday.  He is ok with patient being discharged home today on ASA/statin/imdur 15mg  daily. Toprol XL 25mg  daily and amlodipine for antianginal thearpy and  SL NTG.  He has been told if he has recurrent CP that does not resolve with SL NTG that he needs to come back to ER.

## 2016-07-19 NOTE — Progress Notes (Signed)
Pt discharged home. Discharge instructions have been gone over with the patient. IV's removed. Pt given unit number and told to call if they have any concerns regarding their discharge instructions. Kin Galbraith V, RN   

## 2016-07-19 NOTE — Progress Notes (Signed)
  Echocardiogram 2D Echocardiogram with Definity has been performed.  Darlina Sicilian M 07/19/2016, 11:23 AM

## 2016-07-20 ENCOUNTER — Telehealth: Payer: Self-pay

## 2016-07-20 ENCOUNTER — Other Ambulatory Visit: Payer: Self-pay | Admitting: *Deleted

## 2016-07-20 DIAGNOSIS — I2510 Atherosclerotic heart disease of native coronary artery without angina pectoris: Secondary | ICD-10-CM

## 2016-07-20 NOTE — Telephone Encounter (Signed)
Chelle, Mr Lance Stevens wants you to know that he has been hospitalized twice in the last 11 days. He also wants to make you aware of him having bypass surgery on 07/23/16 with Dr. Pia Mau.

## 2016-07-21 ENCOUNTER — Ambulatory Visit (HOSPITAL_COMMUNITY)
Admission: RE | Admit: 2016-07-21 | Discharge: 2016-07-21 | Disposition: A | Discharge status: Home / Self Care | Payer: Medicare Other | Source: Ambulatory Visit | Attending: Cardiothoracic Surgery | Admitting: Cardiothoracic Surgery

## 2016-07-21 ENCOUNTER — Ambulatory Visit (HOSPITAL_BASED_OUTPATIENT_CLINIC_OR_DEPARTMENT_OTHER)
Admission: RE | Admit: 2016-07-21 | Discharge: 2016-07-21 | Disposition: A | Discharge status: Home / Self Care | Payer: Medicare Other | Source: Ambulatory Visit | Attending: Cardiothoracic Surgery | Admitting: Cardiothoracic Surgery

## 2016-07-21 ENCOUNTER — Encounter (HOSPITAL_COMMUNITY): Payer: Self-pay

## 2016-07-21 ENCOUNTER — Encounter (HOSPITAL_COMMUNITY)
Admission: RE | Admit: 2016-07-21 | Discharge: 2016-07-21 | Disposition: A | Discharge status: Home / Self Care | Payer: Medicare Other | Source: Ambulatory Visit | Attending: Cardiothoracic Surgery | Admitting: Cardiothoracic Surgery

## 2016-07-21 DIAGNOSIS — I6523 Occlusion and stenosis of bilateral carotid arteries: Secondary | ICD-10-CM

## 2016-07-21 DIAGNOSIS — Z01812 Encounter for preprocedural laboratory examination: Secondary | ICD-10-CM

## 2016-07-21 DIAGNOSIS — I2510 Atherosclerotic heart disease of native coronary artery without angina pectoris: Secondary | ICD-10-CM

## 2016-07-21 DIAGNOSIS — Z01818 Encounter for other preprocedural examination: Secondary | ICD-10-CM

## 2016-07-21 DIAGNOSIS — Z0183 Encounter for blood typing: Secondary | ICD-10-CM | POA: Insufficient documentation

## 2016-07-21 HISTORY — DX: Pneumonia, unspecified organism: J18.9

## 2016-07-21 HISTORY — DX: Angina pectoris, unspecified: I20.9

## 2016-07-21 HISTORY — DX: Cardiac murmur, unspecified: R01.1

## 2016-07-21 LAB — BLOOD GAS, ARTERIAL
Acid-base deficit: 0.7 mmol/L (ref 0.0–2.0)
Bicarbonate: 22.5 mEq/L (ref 20.0–24.0)
Drawn by: 205171
FIO2: 0.21
O2 Saturation: 97.9 %
Patient temperature: 98.6
TCO2: 23.4 mmol/L (ref 0–100)
pCO2 arterial: 31.2 mmHg — ABNORMAL LOW (ref 35.0–45.0)
pH, Arterial: 7.472 — ABNORMAL HIGH (ref 7.350–7.450)
pO2, Arterial: 107 mmHg — ABNORMAL HIGH (ref 80.0–100.0)

## 2016-07-21 LAB — URINALYSIS, ROUTINE W REFLEX MICROSCOPIC
Bilirubin Urine: NEGATIVE
Glucose, UA: NEGATIVE mg/dL
Hgb urine dipstick: NEGATIVE
Ketones, ur: NEGATIVE mg/dL
Leukocytes, UA: NEGATIVE
Nitrite: NEGATIVE
Protein, ur: NEGATIVE mg/dL
Specific Gravity, Urine: 1.024 (ref 1.005–1.030)
pH: 5 (ref 5.0–8.0)

## 2016-07-21 LAB — PULMONARY FUNCTION TEST
DL/VA % pred: 103 %
DL/VA: 4.71 ml/min/mmHg/L
DLCO cor % pred: 83 %
DLCO cor: 25.96 ml/min/mmHg
DLCO unc % pred: 80 %
DLCO unc: 25.04 ml/min/mmHg
FEF 25-75 Post: 2.88 L/sec
FEF 25-75 Pre: 2.44 L/sec
FEF2575-%Change-Post: 18 %
FEF2575-%Pred-Post: 119 %
FEF2575-%Pred-Pre: 101 %
FEV1-%Change-Post: 4 %
FEV1-%Pred-Post: 96 %
FEV1-%Pred-Pre: 92 %
FEV1-Post: 3.05 L
FEV1-Pre: 2.91 L
FEV1FVC-%Change-Post: 6 %
FEV1FVC-%Pred-Pre: 104 %
FEV6-%Change-Post: -2 %
FEV6-%Pred-Post: 91 %
FEV6-%Pred-Pre: 93 %
FEV6-Post: 3.67 L
FEV6-Pre: 3.77 L
FEV6FVC-%Change-Post: 0 %
FEV6FVC-%Pred-Post: 105 %
FEV6FVC-%Pred-Pre: 106 %
FVC-%Change-Post: -2 %
FVC-%Pred-Post: 86 %
FVC-%Pred-Pre: 88 %
FVC-Post: 3.69 L
FVC-Pre: 3.77 L
Post FEV1/FVC ratio: 83 %
Post FEV6/FVC ratio: 99 %
Pre FEV1/FVC ratio: 77 %
Pre FEV6/FVC Ratio: 100 %
RV % pred: 98 %
RV: 2.35 L
TLC % pred: 92 %
TLC: 6.32 L

## 2016-07-21 LAB — TYPE AND SCREEN
ABO/RH(D): O POS
Antibody Screen: NEGATIVE

## 2016-07-21 LAB — CBC
HCT: 40.3 % (ref 39.0–52.0)
Hemoglobin: 13.4 g/dL (ref 13.0–17.0)
MCH: 30.9 pg (ref 26.0–34.0)
MCHC: 33.3 g/dL (ref 30.0–36.0)
MCV: 92.9 fL (ref 78.0–100.0)
Platelets: 351 10*3/uL (ref 150–400)
RBC: 4.34 MIL/uL (ref 4.22–5.81)
RDW: 12.2 % (ref 11.5–15.5)
WBC: 8.4 10*3/uL (ref 4.0–10.5)

## 2016-07-21 LAB — COMPREHENSIVE METABOLIC PANEL
ALT: 24 U/L (ref 17–63)
AST: 15 U/L (ref 15–41)
Albumin: 3.5 g/dL (ref 3.5–5.0)
Alkaline Phosphatase: 108 U/L (ref 38–126)
Anion gap: 8 (ref 5–15)
BUN: 18 mg/dL (ref 6–20)
CO2: 27 mmol/L (ref 22–32)
Calcium: 9.6 mg/dL (ref 8.9–10.3)
Chloride: 108 mmol/L (ref 101–111)
Creatinine, Ser: 1.25 mg/dL — ABNORMAL HIGH (ref 0.61–1.24)
GFR calc Af Amer: >60 mL/min (ref >60)
GFR calc non Af Amer: 57 mL/min — ABNORMAL LOW (ref >60)
Glucose, Bld: 158 mg/dL — ABNORMAL HIGH (ref 65–99)
Potassium: 3.9 mmol/L (ref 3.5–5.1)
Sodium: 143 mmol/L (ref 135–145)
Total Bilirubin: 0.5 mg/dL (ref 0.3–1.2)
Total Protein: 6.6 g/dL (ref 6.5–8.1)

## 2016-07-21 LAB — ABO/RH: ABO/RH(D): O POS

## 2016-07-21 LAB — SURGICAL PCR SCREEN
MRSA, PCR: NEGATIVE
Staphylococcus aureus: NEGATIVE

## 2016-07-21 LAB — PROTIME-INR
INR: 1.09
Prothrombin Time: 14.2 seconds (ref 11.4–15.2)

## 2016-07-21 LAB — APTT: aPTT: 34 seconds (ref 24–36)

## 2016-07-21 MED ORDER — ALBUTEROL SULFATE (2.5 MG/3ML) 0.083% IN NEBU
2.5000 mg | INHALATION_SOLUTION | Freq: Once | RESPIRATORY_TRACT | Status: AC
  Administered 2016-07-21: 2.5 mg via RESPIRATORY_TRACT

## 2016-07-21 NOTE — Telephone Encounter (Signed)
See my chart message

## 2016-07-21 NOTE — Pre-Procedure Instructions (Addendum)
Lance Stevens  07/21/2016      [facility] [place], [address] [place] [address] Phone: [phone] Fax: [phone]    Your procedure is scheduled on 07/23/16.  Report to [facility] Admitting at 530 A.M.  Call this number if you have problems the morning of surgery:  [phone]   Remember:  Do not eat food or drink liquids after midnight.  Take these medicines the morning of surgery with A SIP OF WATER amlodipine(norvasc),isosorbide(imdur),metoprolol)toprol)  STOP all herbel meds, nsaids (aleve,naproxen,advil,ibuprofen) 7 days prior to surgery including all vitamins starting now   Do not wear jewelry, make-up or nail polish.  Do not wear lotions, powders, or perfumes.  You may wear deoderant.  Do not shave 48 hours prior to surgery.  Men may shave face and neck.  Do not bring valuables to the hospital.  [facility] is not responsible for any belongings or valuables.  Contacts, dentures or bridgework may not be worn into surgery.  Leave your suitcase in the car.  After surgery it may be brought to your room.  For patients admitted to the hospital, discharge time will be determined by your treatment team.  Patients discharged the day of surgery will not be allowed to drive home.   Name and phone number of your driver:    Special instructions:  Special Instructions: [place] - Preparing for Surgery  Before surgery, you can play an important role.  Because skin is not sterile, your skin needs to be as free of germs as possible.  You can reduce the number of germs on you skin by washing with CHG (chlorahexidine gluconate) soap before surgery.  CHG is an antiseptic cleaner which kills germs and bonds with the skin to continue killing germs even after washing.  Please DO NOT use if you have an allergy to CHG or antibacterial soaps.  If your skin becomes reddened/irritated  stop using the CHG and inform your nurse when you arrive at Short Stay.  Do not shave (including legs and underarms) for at least 48 hours prior to the first CHG shower.  You may shave your face.  Please follow these instructions carefully:   1.  Shower with CHG Soap the night before surgery and the morning of Surgery.  2.  If you choose to wash your hair, wash your hair first as usual with your normal shampoo.  3.  After you shampoo, rinse your hair and body thoroughly to remove the Shampoo.  4.  Use CHG as you would any other liquid soap.  You can apply chg directly  to the skin and wash gently with scrungie or a clean washcloth.  5.  Apply the CHG Soap to your body ONLY FROM THE NECK DOWN.  Do not use on open wounds or open sores.  Avoid contact with your eyes ears, mouth and genitals (private parts).  Wash genitals (private parts)       with your normal soap.  6.  Wash thoroughly, paying special attention to the area where your surgery will be performed.  7.  Thoroughly rinse your body with warm water from the neck down.  8.  DO NOT shower/wash with your normal soap after using and rinsing off the CHG Soap.  9.  Pat yourself dry with a clean towel.            10.  Wear clean pajamas.            11.  Place clean sheets on your bed the night of your first shower and do not sleep with pets.  Day of Surgery  Do not apply any lotions/deodorants the morning of surgery.  Please wear clean clothes to the hospital/surgery center.  Please read over the fact sheets that you were given.

## 2016-07-21 NOTE — Progress Notes (Signed)
Pre-op Cardiac Surgery  Carotid Findings:  Findings consistent with 1- 39 percent stenosis involving the right internal carotid artery and the left internal carotid artery.   Upper Extremity Right Left  Brachial Pressures 143 Triphasic 134 Triphasic  Radial Waveforms Triphasic Triphasic  Ulnar Waveforms Triphasic Triphasic  Palmar Arch (Allen's Test) normal normal   Findings:   Palmar Arch evaluation-Doppler waveforms remain within normal limits with compression in the radial artery and the ulnar artery at rest bilaterally.   Lower  Extremity Right Left  Anterior Tibial 160 Triphasic 145 Triphasic  Posterior Tibial 170 Triphasic 153 Triphasic  Ankle/Brachial Indices 1.2 1.1    Findings:   ABI's waveforms and Doppler are normal at rest bilaterally.

## 2016-07-22 ENCOUNTER — Encounter: Payer: Self-pay | Admitting: Cardiothoracic Surgery

## 2016-07-22 ENCOUNTER — Other Ambulatory Visit: Payer: Self-pay | Admitting: *Deleted

## 2016-07-22 ENCOUNTER — Ambulatory Visit (INDEPENDENT_AMBULATORY_CARE_PROVIDER_SITE_OTHER): Payer: Medicare Other | Admitting: Cardiothoracic Surgery

## 2016-07-22 VITALS — BP 120/74 | HR 64 | Resp 20 | Ht 69.0 in | Wt 205.0 lb

## 2016-07-22 DIAGNOSIS — I2510 Atherosclerotic heart disease of native coronary artery without angina pectoris: Secondary | ICD-10-CM

## 2016-07-22 LAB — HEMOGLOBIN A1C
Hgb A1c MFr Bld: 7 % — ABNORMAL HIGH (ref 4.8–5.6)
Mean Plasma Glucose: 154 mg/dL

## 2016-07-22 MED ORDER — SODIUM CHLORIDE 0.9 % IV SOLN
INTRAVENOUS | Status: AC
  Administered 2016-07-23: 69.8 mL/h via INTRAVENOUS
  Filled 2016-07-22: qty 40

## 2016-07-22 MED ORDER — MAGNESIUM SULFATE 50 % IJ SOLN
40.0000 meq | INTRAMUSCULAR | Status: DC
  Filled 2016-07-22: qty 10

## 2016-07-22 MED ORDER — SODIUM CHLORIDE 0.9 % IV SOLN
INTRAVENOUS | Status: AC
  Administered 2016-07-23: 2.6 [IU]/h via INTRAVENOUS
  Filled 2016-07-22: qty 2.5

## 2016-07-22 MED ORDER — LEVOFLOXACIN IN D5W 500 MG/100ML IV SOLN
500.0000 mg | INTRAVENOUS | Status: AC
  Administered 2016-07-23: 500 mg via INTRAVENOUS
  Filled 2016-07-22: qty 100

## 2016-07-22 MED ORDER — EPINEPHRINE HCL 1 MG/ML IJ SOLN
0.0000 ug/min | INTRAVENOUS | Status: DC
  Filled 2016-07-22: qty 4

## 2016-07-22 MED ORDER — DOPAMINE-DEXTROSE 3.2-5 MG/ML-% IV SOLN
0.0000 ug/kg/min | INTRAVENOUS | Status: AC
  Administered 2016-07-23: 3 ug/kg/min via INTRAVENOUS
  Filled 2016-07-22: qty 250

## 2016-07-22 MED ORDER — PLASMA-LYTE 148 IV SOLN
INTRAVENOUS | Status: AC
  Administered 2016-07-23: 100 mL
  Filled 2016-07-22: qty 2.5

## 2016-07-22 MED ORDER — DEXTROSE 5 % IV SOLN
1.5000 g | INTRAVENOUS | Status: DC
  Filled 2016-07-22: qty 1.5

## 2016-07-22 MED ORDER — SODIUM CHLORIDE 0.9 % IV SOLN
INTRAVENOUS | Status: DC
  Filled 2016-07-22: qty 30

## 2016-07-22 MED ORDER — VANCOMYCIN HCL 10 G IV SOLR
1250.0000 mg | INTRAVENOUS | Status: AC
  Administered 2016-07-23: 1250 mg via INTRAVENOUS
  Filled 2016-07-22 (×2): qty 1250

## 2016-07-22 MED ORDER — POTASSIUM CHLORIDE 2 MEQ/ML IV SOLN
80.0000 meq | INTRAVENOUS | Status: DC
  Filled 2016-07-22: qty 40

## 2016-07-22 MED ORDER — DEXTROSE 5 % IV SOLN
30.0000 ug/min | INTRAVENOUS | Status: AC
  Administered 2016-07-23: 25 ug/min via INTRAVENOUS
  Filled 2016-07-22: qty 2

## 2016-07-22 MED ORDER — DEXTROSE 5 % IV SOLN
750.0000 mg | INTRAVENOUS | Status: DC
  Filled 2016-07-22: qty 750

## 2016-07-22 MED ORDER — DEXMEDETOMIDINE HCL IN NACL 400 MCG/100ML IV SOLN
0.1000 ug/kg/h | INTRAVENOUS | Status: AC
Start: 2016-07-23 — End: 2016-07-23
  Administered 2016-07-23: .3 ug/kg/h via INTRAVENOUS
  Filled 2016-07-22: qty 100

## 2016-07-22 MED ORDER — NITROGLYCERIN IN D5W 200-5 MCG/ML-% IV SOLN
2.0000 ug/min | INTRAVENOUS | Status: DC
  Filled 2016-07-22: qty 250

## 2016-07-22 NOTE — Progress Notes (Signed)
[address]       [place] [address]             [phone]                    Lance Stevens [place] Medical Record [id number] Date of Birth: 04/22/1946  Referring: Martinique, Peter M, MD Primary Care: Harrison Mons, PA-C  Chief Complaint:    Chief Complaint  Patient presents with  . Coronary Artery Disease    Further discuss surgery scheduled for 07/23/2016    History of Present Illness:     Patient admitted this am through ER with substernal tightness, denies pain, no nausea or diaphoresis.     He was in the hospital  from 07/10/2016 - 07/12/2016 for chest tightness  as well. His symptoms were thought to have both atypical and typical components, stress test was done was initially. This showed no evidence of reversible ischemia but a moderate region of fixed defect in the inferolateral wall extending to the true apex suggesting an area of prior infarct or scarring versus diaphragmatic attenuation artifact. Dr. Haroldine Laws reviewed this along with a Chest CT which showed 3c coronary calcifications and recommended a cardiac catheterization. Mildly dilated ascending aorta was noted 4.1 cm. The  CT scan was done in march 2017 in Urology office  to RO bony mets from prostate cancer.  Cardiac cath was on 07/12/2016 and showed 3-vessel CAD (LAD with 50% ostial, 80% mid, and 70% distal disease, 90% very small OM1, 50% mid RCA, 70% very small PDA, 80% PLOM. Aggressive medical therapy was recommended and he was started on ASA, Amlodipine, BB, and a statin. The patient is  unsure about his meds but  appears he was stared on statin,  lopressor 12.5 mg bid and Norvasc 5mg  for bp 220/120 on admission. Dr Lonia Blood  recommended  medical therapy, and  if he continued to have angina consider CABG. Since being discharged five days ago, he reports doing well until today . Had been taking his new medications as prescribed without any symptoms. This morning, he developed pain around 0400 which  awoke him from sleep. Says it has been constant since but increasing in severity. No association with rest or exertion. Had mild diaphoresis this AM. No nausea, vomiting, or dyspnea.   While in the ED, labs have showna WBC of 16.4, Hgb 14.5, platelets 282. Electrolytes WNL. Creatinine 1.18. BNP 50.1. Initial troponin negative. CXR with no active cardiopulmonary disease. EKG shows NSR, HR 77, with RBBB and down-sloping ST in Leads I and II.    TNM code: pT3a, pN0 prostate cancer resected 04/2016       Current Activity/ Functional Status:  Patient is independent with mobility/ambulation, transfers, ADL's, IADL's.   Zubrod Score: At the time of surgery this patient's most appropriate activity status/level should be described as: []     0    Normal activity, no symptoms [x]     1    Restricted in physical strenuous activity but ambulatory, able to do out light work []     2    Ambulatory and capable of self care, unable to do work activities, up and about               >50 % of waking hours                              []   3    Only limited self care, in bed greater than 50% of waking hours []     4    Completely disabled, no self care, confined to bed or chair []     5    Moribund   Past Medical History:  Diagnosis Date  . Allergy   . Anginal pain ([place])   . Borderline diabetes   . Bronchitis    recurrent  . CAD in native artery    a. Multivessel CAD by cath 07/2016 - tx with medical therapy with consideration of CABG for refractory angina.  . Essential hypertension   . Fracture, finger    LEFT HAND WITH SWELLING AND BRUISING  . Heart murmur    ? teen  . History of gout   . History of kidney stones   . History of kidney stones   . History of skin cancer    possible - pt not sure of dx  . Hyperlipidemia   . Pneumonia    hx  . Prostate cancer [facility])    a. s/p radical prostatectomy 04/2016.  Marland Kitchen Sinus infection    recurrent    Past Surgical History:  Procedure Laterality  Date  . CARDIAC CATHETERIZATION N/A 07/12/2016   Procedure: Left Heart Cath and Coronary Angiography;  Surgeon: Peter M Martinique, MD;  Location: [facility];  Service: Cardiovascular;  Laterality: N/A;  . EYE SURGERY     B cataracts removed.  Marland Kitchen EYE SURGERY     lens implants  . LITHOTRIPSY     89  . LYMPHADENECTOMY Bilateral 04/05/2016   Procedure: LYMPHADENECTOMY, BILATERAL PELVIC LYMPHADENECTOMY;  Surgeon: Raynelle Bring, MD;  Location: [facility];  Service: Urology;  Laterality: Bilateral;  . PROSTATE BIOPSY    . ROBOT ASSISTED LAPAROSCOPIC RADICAL PROSTATECTOMY N/A 04/05/2016   Procedure: XI ROBOTIC ASSISTED LAPAROSCOPIC RADICAL PROSTATECTOMY LEVEL 2;  Surgeon: Raynelle Bring, MD;  Location: [facility];  Service: Urology;  Laterality: N/A;  . TONSILLECTOMY AND ADENOIDECTOMY     also received radiation treatments, as was frequent in children at the time  . WISDOM TOOTH EXTRACTION      Family History  Problem Relation Age of Onset  . Heart attack Father   . Heart disease Father 22    AMI as cause of death  . Diabetes Father   . Hyperlipidemia Father   . Parkinson's disease Brother   . Diabetes Brother   . Cancer Brother     melanoma retina  . Heart disease Brother   . Stroke Maternal Grandmother   . Cancer Mother 41    Hodgkins   . Glaucoma Mother   . Colon cancer Neg Hx   . Colon polyps Neg Hx     Social History   Social History  . Marital status: Married    Spouse name: [name]  . Number of children: 3  . Years of education: N/A   Occupational History  . 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 Smoking status: Never Smoker  . Smokeless tobacco: Never Used  . Alcohol use 1.2 oz/week    2 Standard drinks or equivalent per week     Comment: wine - red 2 glasses wine per week  . Drug use: No  . Sexual activity: Yes    Partners: Female   Other Topics Concern  . Not on file   Social History Narrative     Marital status:  married x 25 years; second marriage; first marriage x 12 years.      Children: 3 daughters and one step-daughter; 4 grandchildren      Lives: with wife.      Employment: semi-retired; Company secretary; professor; Social worker, Probation officer.      Tobacco:  no      Alcohol: about 2 glasses of wine/week      Exercise: active in the yard.    History  Smoking Status  . Never Smoker  Smokeless Tobacco  . Never Used    History  Alcohol Use  . 1.2 oz/week  . 2 Standard drinks or equivalent per week    Comment: wine - red 2 glasses wine per week     Allergies  Allergen Reactions  . Penicillins Swelling and Other (See Comments)    As a child. Has patient had a PCN reaction causing immediate rash, facial/tongue/throat swelling, SOB or lightheadedness with hypotension: Yes Has patient had a PCN reaction causing severe rash involving mucus membranes or skin necrosis: No Has patient had a PCN reaction that required hospitalization: Was already in hospital when reaction happened Has patient had a PCN reaction occurring within the last 10 years: No  If all of the above answers are "NO", then may proceed with Cephalosporin use.     Current Outpatient Prescriptions  Medication Sig Dispense Refill  . acetaminophen (TYLENOL) 325 MG tablet Take 2 tablets (650 mg total) by mouth every 4 (four) hours as needed for headache or mild pain.    Marland Kitchen amLODipine (NORVASC) 5 MG tablet Take 1 tablet (5 mg total) by mouth daily. 30 tablet 0  . aspirin EC 81 MG EC tablet Take 1 tablet (81 mg total) by mouth daily.    Marland Kitchen atorvastatin (LIPITOR) 40 MG tablet Take 1 tablet (40 mg total) by mouth daily at 6 PM. 30 tablet 0  . isosorbide mononitrate (IMDUR) 30 MG 24 hr tablet Take 0.5 tablets (15 mg total) by mouth daily. 30 tablet 3  . metoprolol succinate (TOPROL-XL) 25 MG 24 hr tablet Take 1 tablet (25 mg total) by mouth daily. 30 tablet 0  . nitroGLYCERIN (NITROSTAT) 0.4 MG SL tablet Place 1 tablet (0.4 mg  total) under the tongue every 5 (five) minutes as needed for chest pain. 25 tablet 2   No current facility-administered medications for this visit.    Facility-Administered Medications Ordered in Other Visits  Medication Dose Route Frequency Provider Last Rate Last Dose  . [START ON 07/23/2016] aminocaproic acid (AMICAR) 10 g in sodium chloride 0.9 % 100 mL infusion   Intravenous To OR Skeet Simmer, RPH      . [START ON 07/23/2016] cefUROXime (ZINACEF) 1.5 g in dextrose 5 % 50 mL IVPB  1.5 g Intravenous To OR Skeet Simmer, RPH      . [START ON 07/23/2016] cefUROXime (ZINACEF) 750 mg in dextrose 5 % 50 mL IVPB  750 mg Intravenous To OR Skeet Simmer, RPH      . [START ON 07/23/2016] dexmedetomidine (PRECEDEX) 400 MCG/100ML (4 mcg/mL) infusion  0.1-0.7 mcg/kg/hr Intravenous To OR Skeet Simmer, RPH      . [START ON 07/23/2016] DOPamine (INTROPIN) 800 mg in dextrose 5 % 250 mL (3.2 mg/mL) infusion  0-10 mcg/kg/min Intravenous To OR Skeet Simmer, RPH      . [START ON 07/23/2016] EPINEPHrine (ADRENALIN) 4 mg in dextrose 5 % 250 mL (0.016 mg/mL) infusion  0-10 mcg/min Intravenous To OR Skeet Simmer, RPH      . [  START ON 07/23/2016] heparin 2,500 Units, papaverine 30 mg in electrolyte-148 (PLASMALYTE-148) 500 mL irrigation   Irrigation To OR Skeet Simmer, RPH      . [START ON 07/23/2016] heparin 30,000 units/NS 1000 mL solution for CELLSAVER   Other To OR Skeet Simmer, [facility]      . [START ON 07/23/2016] insulin regular (NOVOLIN R,HUMULIN R) 250 Units in sodium chloride 0.9 % 250 mL (1 Units/mL) infusion   Intravenous To OR Skeet Simmer, RPH      . [START ON 07/23/2016] magnesium sulfate (IV Push/IM) injection 40 mEq  40 mEq Other To OR Skeet Simmer, RPH      . [START ON 07/23/2016] nitroGLYCERIN 50 mg in dextrose 5 % 250 mL (0.2 mg/mL) infusion  2-200 mcg/min Intravenous To OR Skeet Simmer, RPH      . [START ON 07/23/2016] phenylephrine (NEO-SYNEPHRINE) 20 mg in dextrose 5 % 250 mL (0.08 mg/mL)  infusion  30-200 mcg/min Intravenous To OR Skeet Simmer, RPH      . [START ON 07/23/2016] potassium chloride injection 80 mEq  80 mEq Other To OR Skeet Simmer, [facility]      . [START ON 07/23/2016] vancomycin (VANCOCIN) 1,250 mg in sodium chloride 0.9 % 250 mL IVPB  1,250 mg Intravenous To OR Skeet Simmer, RPH        Review of Systems:     Cardiac Review of Systems: Y or N  Chest Pain [ y   ]  Resting SOB [n   ] Exertional SOB  [ y ]  Orthopnea [ n ]   Pedal Edema [ n  ]    Palpitations [n  ] Syncope  [ n ]   Presyncope [n   ]  General Review of Systems: [Y] = yes [  ]=no Constitional: recent weight change [  ];  Wt loss over the last 3 months [   ] anorexia [  ]; fatigue [  ]; nausea [  ]; night sweats [  ]; fever [  ]; or chills [  ];          Dental: poor dentition[  ]; Last Dentist visit:   Eye : blurred vision [ n ]; diplopia [   ]; vision changes [  ];  Amaurosis fugax[n  ]; Resp: cough [  ];  wheezing[  ];  hemoptysis[  ]; shortness of breath[  ]; paroxysmal nocturnal dyspnea[  ]; dyspnea on exertion[  ]; or orthopnea[  ];  GI:  gallstones[  ], vomiting[  ];  dysphagia[  ]; melena[  ];  hematochezia [  ]; heartburn[  ];   Hx of  Colonoscopy[  ]; GU: kidney stones [  ]; hematuria[  ];   dysuria [  ];  nocturia[  ];  history of     obstruction [  ]; urinary frequency [  ]             Skin: rash, swelling[  ];, hair loss[  ];  peripheral edema[  ];  or itching[  ]; Musculosketetal: myalgias[  ];  joint swelling[  ];  joint erythema[  ];  joint pain[  ];  back pain[  ];  Heme/Lymph: bruising[  ];  bleeding[  ];  anemia[  ];  Neuro: TIA[ n ];  headaches[  ];  stroke[n  ];  vertigo[  ];  seizures[  ];   paresthesias[  ];  difficulty walking[ n ];  Psych:depression[  ]; anxiety[  ];  Endocrine: diabetes[ y ];  thyroid dysfunction[n  ];  Immunizations: Flu up to date [ n ]; Pneumococcal up to date n[  ];  Other:  Physical Exam: BP 120/74 (BP Location: Right Arm, Patient Position: Sitting,  Cuff Size: Normal)   Pulse 64   Resp 20   Ht 5\' 9"  (1.753 m)   Wt 205 lb (93 kg)   SpO2 99% Comment: RA  BMI 30.27 kg/m   PHYSICAL EXAMINATION: General appearance: alert and cooperative Head: Normocephalic, without obvious abnormality, atraumatic Neck: no adenopathy, no carotid bruit, no JVD, supple, symmetrical, trachea midline and thyroid not enlarged, symmetric, no tenderness/mass/nodules Lymph nodes: Cervical, supraclavicular, and axillary nodes normal. Resp: clear to auscultation bilaterally Back: symmetric, no curvature. ROM normal. No CVA tenderness. Cardio: regular rate and rhythm, S1, S2 normal, no murmur, click, rub or gallop GI: soft, non-tender; bowel sounds normal; no masses,  no organomegaly Extremities: extremities normal, atraumatic, no cyanosis or edema and Homans sign is negative, no sign of DVT Neurologic: Grossly normal    Diagnostic Studies & Laboratory data:     Recent Radiology Findings:   Dg Chest 2 View  Result Date: 07/17/2016 CLINICAL DATA:  Patient with chest pain and pressure. EXAM: CHEST  2 VIEW COMPARISON:  Chest radiograph 07/10/2016 FINDINGS: Normal cardiac and mediastinal contours. No consolidative pulmonary opacities. No pleural effusion pneumothorax. Thoracic spine degenerative changes. IMPRESSION: No active cardiopulmonary disease. Electronically Signed   By: Lovey Newcomer M.D.   On: 07/17/2016 09:30   Nm Myocar Multi W/spect W/wall Motion / Ef  Result Date: 07/11/2016 CLINICAL DATA:  70 year old male with new onset chest pressure EXAM: MYOCARDIAL IMAGING WITH SPECT (REST AND PHARMACOLOGIC-STRESS) GATED LEFT VENTRICULAR WALL MOTION STUDY LEFT VENTRICULAR EJECTION FRACTION TECHNIQUE: Standard myocardial SPECT imaging was performed after resting intravenous injection of 10 mCi Tc-1m tetrofosmin. Subsequently, intravenous infusion of Lexiscan was performed under the supervision of the Cardiology staff. At peak effect of the drug, 30 mCi Tc-73m  tetrofosmin was injected intravenously and standard myocardial SPECT imaging was performed. Quantitative gated imaging was also performed to evaluate left ventricular wall motion, and estimate left ventricular ejection fraction. COMPARISON:  Chest x-ray 07/10/2016 FINDINGS: Perfusion: No decreased activity in the left ventricle on stress imaging to suggest reversible ischemia. There is a region of mildly attenuated radiotracer activity in the inferolateral wall extending to the true apex both at rest and during stress suggesting a region of prior infarct or scarring. Diaphragmatic attenuation artifact is also possible. Wall Motion: Normal left ventricular wall motion. No left ventricular dilation. Left Ventricular Ejection Fraction: 57 % End diastolic volume 47 ml End systolic volume 20 ml IMPRESSION: 1. No evidence of reversible ischemia. Moderate region of fixed defect in the inferolateral wall extending to the true apex suggests an area of prior infarct or scarring versus diaphragmatic attenuation artifact. 2. Normal left ventricular wall motion. 3. Left ventricular ejection fraction 57% 4. Non invasive risk stratification*: Low *2012 Appropriate Use Criteria for Coronary Revascularization Focused Update: J Am Coll Cardiol. [id number]. http://content.airportbarriers.com.aspx?articleid=[email or web address] Electronically Signed   By: Jacqulynn Cadet M.D.   On: 07/11/2016 12:37   Dg Chest Portable 1 View  Result Date: 07/10/2016 CLINICAL DATA:  Chest pain and pressure.  Left arm pain. EXAM: PORTABLE CHEST 1 VIEW COMPARISON:  Chest CT 02/25/2016 FINDINGS: The cardiomediastinal contours are normal. The lungs are clear. Pulmonary vasculature is normal. No consolidation, pleural effusion, or pneumothorax. Cortical thickening of the right fourth and  left ninth ribs on a CT not well visualized radiographically. There is degenerative change in the spine. IMPRESSION: No acute abnormality. Electronically Signed   By:  Jeb Levering M.D.   On: 07/10/2016 02:29     I have independently reviewed the above radiologic studies.  Recent Lab Findings: Lab Results  Component Value Date   WBC 8.4 07/21/2016   HGB 13.4 07/21/2016   HCT 40.3 07/21/2016   PLT 351 07/21/2016   GLUCOSE 158 (H) 07/21/2016   CHOL 196 07/10/2016   TRIG 365 (H) 07/10/2016   HDL 24 (L) 07/10/2016   LDLCALC 99 07/10/2016   ALT 24 07/21/2016   AST 15 07/21/2016   NA 143 07/21/2016   K 3.9 07/21/2016   CL 108 07/21/2016   CREATININE 1.25 (H) 07/21/2016   BUN 18 07/21/2016   CO2 27 07/21/2016   INR 1.09 07/21/2016   HGBA1C 7.0 (H) 07/21/2016   CT of chest 02/2016: Final Report  CLINICAL DATA: Followup rib abnormality seen on recent bone scan.  EXAM: CT CHEST WITHOUT CONTRAST  TECHNIQUE: Multidetector CT imaging of the chest was performed following the standard protocol without IV contrast.  COMPARISON: Whole bone scan 02/19/2016  FINDINGS: Mediastinum/Nodes: No chest wall mass, supraclavicular or axillary lymphadenopathy.  The heart is normal in size. No pericardial effusion. 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 Scattered aortic calcifications.  Three-vessel coronary artery calcifications.  No mediastinal or hilar mass or adenopathy. Esophagus is grossly normal.  Lungs/Pleura: No acute pulmonary findings. No worrisome pulmonary lesions. No pleural effusion.  Upper abdomen: No significant upper abdominal findings.  Musculoskeletal: The abnormalities the on the bone scan correlate 2 areas of marked cortical thickening involving the fourth right posterior rib and also the ninth left posterior rib. This is not have the appearance of prostate cancer. This is most likely some type of hyperostotic process and could be related to remote trauma. Paget's disease would be another possibility. There are also diffuse degenerative changes involving the thoracic  spine with osteophytosis and DISH.  Severe degenerate disc disease is barely covered at L2-3 but may account for the abnormality on the bone scan.  IMPRESSION: 1. The findings on the bone scan are un likely due to prostate cancer. This is more likely remote trauma or other hyperostotic process such as Paget's disease. 2. 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 3. No findings suspicious for metastatic prostate cancer. 4. Three vessel coronary artery calcifications.   Electronically Signed By: Marijo Sanes M.D. On: 02/25/2016 17:27 Procedures   Left Heart Cath and Coronary Angiography  Conclusion     RPDA lesion, 70 %stenosed.  1st RPLB lesion, 80 %stenosed.  LM lesion, 30 %stenosed.  Prox Cx lesion, 30 %stenosed.  Ost 1st Mrg to 1st Mrg lesion, 90 %stenosed.  Dist LAD lesion, 70 %stenosed.  The left ventricular systolic function is normal.  LV end diastolic pressure is mildly elevated.  The left ventricular ejection fraction is 55-65% by visual estimate.  Mid RCA lesion, 50 %stenosed.  Mid LAD lesion, 80 %stenosed.  Prox LAD lesion, 50 %stenosed.   1. Moderate 3 vessel obstructive CAD.    - LAD with 50% ostial, 80% mid, and 70% distal disease.    - 90% very small OM1    - 50% mid RCA, 70% very small PDA, 80% PLOM 2. Good LV function  Plan: recommend aggressive medical therapy. Would continue ASA, amlodipine. Add beta blocker and statin.  Patient has only had one episode of chest pain and a low risk Myoview. If he continues to have angina on medical therapy then we should consider him for revascularization with CABG. Will need close follow up as outpatient.      I have independently reviewed the above  cath films and reviewed the findings with the  patient . I have reviewed the films with Dr Burt Knack and Dr Darcey Nora whom concurr CABG is best treatment option    Assessment / Plan:   Plan to proceed with coronary artery bypass grafting  tomorrow as discussed with the patient during his recent hospitalization, risks and options have been discussed again with he and his wife in detail.   The goals risks and alternatives of the planned surgical procedure CABG  have been discussed with the patient in detail. The risks of the procedure including death, infection, stroke, myocardial infarction, bleeding, blood transfusion have all been discussed specifically.  I have quoted Lance Stevens a 2 % of perioperative mortality and a complication rate as high as 20 %. The patient's questions have been answered.Lance Stevens is willing  to proceed with the planned procedure.   Grace Isaac MD      [address] [place] [address] Office [phone]   Beeper [phone]  07/22/2016 5:41 PM

## 2016-07-23 ENCOUNTER — Inpatient Hospital Stay (HOSPITAL_COMMUNITY)
Admission: RE | Admit: 2016-07-23 | Discharge: 2016-08-02 | DRG: 236 | Disposition: A | Discharge status: Home / Self Care | Payer: Medicare Other | Source: Ambulatory Visit | Attending: Cardiothoracic Surgery | Admitting: Cardiothoracic Surgery

## 2016-07-23 ENCOUNTER — Encounter (HOSPITAL_COMMUNITY): Payer: Self-pay | Admitting: *Deleted

## 2016-07-23 ENCOUNTER — Inpatient Hospital Stay (HOSPITAL_COMMUNITY): Payer: Medicare Other | Admitting: Certified Registered Nurse Anesthetist

## 2016-07-23 ENCOUNTER — Encounter (HOSPITAL_COMMUNITY)
Admission: RE | Disposition: A | Discharge status: Home / Self Care | Payer: Self-pay | Source: Ambulatory Visit | Attending: Cardiothoracic Surgery

## 2016-07-23 ENCOUNTER — Inpatient Hospital Stay (HOSPITAL_COMMUNITY): Payer: Medicare Other

## 2016-07-23 DIAGNOSIS — J90 Pleural effusion, not elsewhere classified: Secondary | ICD-10-CM | POA: Diagnosis not present

## 2016-07-23 DIAGNOSIS — Z6830 Body mass index (BMI) 30.0-30.9, adult: Secondary | ICD-10-CM

## 2016-07-23 DIAGNOSIS — I25110 Atherosclerotic heart disease of native coronary artery with unstable angina pectoris: Principal | ICD-10-CM | POA: Diagnosis present

## 2016-07-23 DIAGNOSIS — I4891 Unspecified atrial fibrillation: Secondary | ICD-10-CM | POA: Diagnosis not present

## 2016-07-23 DIAGNOSIS — Z9841 Cataract extraction status, right eye: Secondary | ICD-10-CM | POA: Diagnosis not present

## 2016-07-23 DIAGNOSIS — E8770 Fluid overload, unspecified: Secondary | ICD-10-CM | POA: Diagnosis not present

## 2016-07-23 DIAGNOSIS — E1122 Type 2 diabetes mellitus with diabetic chronic kidney disease: Secondary | ICD-10-CM | POA: Diagnosis present

## 2016-07-23 DIAGNOSIS — Z8546 Personal history of malignant neoplasm of prostate: Secondary | ICD-10-CM

## 2016-07-23 DIAGNOSIS — J9811 Atelectasis: Secondary | ICD-10-CM | POA: Diagnosis not present

## 2016-07-23 DIAGNOSIS — Z8249 Family history of ischemic heart disease and other diseases of the circulatory system: Secondary | ICD-10-CM | POA: Diagnosis not present

## 2016-07-23 DIAGNOSIS — I10 Essential (primary) hypertension: Secondary | ICD-10-CM | POA: Diagnosis present

## 2016-07-23 DIAGNOSIS — Z9842 Cataract extraction status, left eye: Secondary | ICD-10-CM

## 2016-07-23 DIAGNOSIS — Z9079 Acquired absence of other genital organ(s): Secondary | ICD-10-CM

## 2016-07-23 DIAGNOSIS — E785 Hyperlipidemia, unspecified: Secondary | ICD-10-CM | POA: Diagnosis present

## 2016-07-23 DIAGNOSIS — Z951 Presence of aortocoronary bypass graft: Secondary | ICD-10-CM

## 2016-07-23 DIAGNOSIS — Z79899 Other long term (current) drug therapy: Secondary | ICD-10-CM

## 2016-07-23 DIAGNOSIS — N182 Chronic kidney disease, stage 2 (mild): Secondary | ICD-10-CM | POA: Diagnosis present

## 2016-07-23 DIAGNOSIS — E119 Type 2 diabetes mellitus without complications: Secondary | ICD-10-CM

## 2016-07-23 DIAGNOSIS — Z961 Presence of intraocular lens: Secondary | ICD-10-CM | POA: Diagnosis present

## 2016-07-23 DIAGNOSIS — D62 Acute posthemorrhagic anemia: Secondary | ICD-10-CM | POA: Diagnosis not present

## 2016-07-23 DIAGNOSIS — I517 Cardiomegaly: Secondary | ICD-10-CM | POA: Diagnosis not present

## 2016-07-23 DIAGNOSIS — I2510 Atherosclerotic heart disease of native coronary artery without angina pectoris: Secondary | ICD-10-CM | POA: Diagnosis present

## 2016-07-23 DIAGNOSIS — Z09 Encounter for follow-up examination after completed treatment for conditions other than malignant neoplasm: Secondary | ICD-10-CM

## 2016-07-23 DIAGNOSIS — C61 Malignant neoplasm of prostate: Secondary | ICD-10-CM | POA: Diagnosis present

## 2016-07-23 HISTORY — PX: TEE WITHOUT CARDIOVERSION: SHX5443

## 2016-07-23 HISTORY — PX: CORONARY ARTERY BYPASS GRAFT: SHX141

## 2016-07-23 LAB — POCT I-STAT, CHEM 8
BUN: 18 mg/dL (ref 6–20)
BUN: 16 mg/dL (ref 6–20)
BUN: 18 mg/dL (ref 6–20)
BUN: 17 mg/dL (ref 6–20)
BUN: 16 mg/dL (ref 6–20)
BUN: 15 mg/dL (ref 6–20)
BUN: 11 mg/dL (ref 6–20)
Calcium, Ion: 1.2 mmol/L (ref 1.12–1.23)
Calcium, Ion: 1.04 mmol/L — ABNORMAL LOW (ref 1.12–1.23)
Calcium, Ion: 1.19 mmol/L (ref 1.12–1.23)
Calcium, Ion: 1.1 mmol/L — ABNORMAL LOW (ref 1.12–1.23)
Calcium, Ion: 1.06 mmol/L — ABNORMAL LOW (ref 1.12–1.23)
Calcium, Ion: 1.04 mmol/L — ABNORMAL LOW (ref 1.12–1.23)
Calcium, Ion: 1.15 mmol/L (ref 1.12–1.23)
Chloride: 102 mmol/L (ref 101–111)
Chloride: 100 mmol/L — ABNORMAL LOW (ref 101–111)
Chloride: 102 mmol/L (ref 101–111)
Chloride: 100 mmol/L — ABNORMAL LOW (ref 101–111)
Chloride: 100 mmol/L — ABNORMAL LOW (ref 101–111)
Chloride: 101 mmol/L (ref 101–111)
Chloride: 103 mmol/L (ref 101–111)
Creatinine, Ser: 0.9 mg/dL (ref 0.61–1.24)
Creatinine, Ser: 0.7 mg/dL (ref 0.61–1.24)
Creatinine, Ser: 0.9 mg/dL (ref 0.61–1.24)
Creatinine, Ser: 0.8 mg/dL (ref 0.61–1.24)
Creatinine, Ser: 0.8 mg/dL (ref 0.61–1.24)
Creatinine, Ser: 0.8 mg/dL (ref 0.61–1.24)
Creatinine, Ser: 0.8 mg/dL (ref 0.61–1.24)
Glucose, Bld: 189 mg/dL — ABNORMAL HIGH (ref 65–99)
Glucose, Bld: 133 mg/dL — ABNORMAL HIGH (ref 65–99)
Glucose, Bld: 145 mg/dL — ABNORMAL HIGH (ref 65–99)
Glucose, Bld: 152 mg/dL — ABNORMAL HIGH (ref 65–99)
Glucose, Bld: 146 mg/dL — ABNORMAL HIGH (ref 65–99)
Glucose, Bld: 149 mg/dL — ABNORMAL HIGH (ref 65–99)
Glucose, Bld: 114 mg/dL — ABNORMAL HIGH (ref 65–99)
HCT: 33 % — ABNORMAL LOW (ref 39.0–52.0)
HCT: 27 % — ABNORMAL LOW (ref 39.0–52.0)
HCT: 31 % — ABNORMAL LOW (ref 39.0–52.0)
HCT: 26 % — ABNORMAL LOW (ref 39.0–52.0)
HCT: 26 % — ABNORMAL LOW (ref 39.0–52.0)
HCT: 27 % — ABNORMAL LOW (ref 39.0–52.0)
HCT: 26 % — ABNORMAL LOW (ref 39.0–52.0)
Hemoglobin: 11.2 g/dL — ABNORMAL LOW (ref 13.0–17.0)
Hemoglobin: 10.5 g/dL — ABNORMAL LOW (ref 13.0–17.0)
Hemoglobin: 9.2 g/dL — ABNORMAL LOW (ref 13.0–17.0)
Hemoglobin: 8.8 g/dL — ABNORMAL LOW (ref 13.0–17.0)
Hemoglobin: 8.8 g/dL — ABNORMAL LOW (ref 13.0–17.0)
Hemoglobin: 9.2 g/dL — ABNORMAL LOW (ref 13.0–17.0)
Hemoglobin: 8.8 g/dL — ABNORMAL LOW (ref 13.0–17.0)
Potassium: 3.9 mmol/L (ref 3.5–5.1)
Potassium: 3.9 mmol/L (ref 3.5–5.1)
Potassium: 4.2 mmol/L (ref 3.5–5.1)
Potassium: 4.1 mmol/L (ref 3.5–5.1)
Potassium: 4.3 mmol/L (ref 3.5–5.1)
Potassium: 3.4 mmol/L — ABNORMAL LOW (ref 3.5–5.1)
Potassium: 3.9 mmol/L (ref 3.5–5.1)
Sodium: 139 mmol/L (ref 135–145)
Sodium: 140 mmol/L (ref 135–145)
Sodium: 140 mmol/L (ref 135–145)
Sodium: 137 mmol/L (ref 135–145)
Sodium: 139 mmol/L (ref 135–145)
Sodium: 140 mmol/L (ref 135–145)
Sodium: 138 mmol/L (ref 135–145)
TCO2: 28 mmol/L (ref 0–100)
TCO2: 28 mmol/L (ref 0–100)
TCO2: 28 mmol/L (ref 0–100)
TCO2: 28 mmol/L (ref 0–100)
TCO2: 26 mmol/L (ref 0–100)
TCO2: 24 mmol/L (ref 0–100)
TCO2: 25 mmol/L (ref 0–100)

## 2016-07-23 LAB — CBC WITH DIFFERENTIAL/PLATELET
Basophils Absolute: 0 10*3/uL (ref 0.0–0.1)
Basophils Relative: 0 %
Eosinophils Absolute: 0 10*3/uL (ref 0.0–0.7)
Eosinophils Relative: 0 %
HCT: 28.7 % — ABNORMAL LOW (ref 39.0–52.0)
Hemoglobin: 9.7 g/dL — ABNORMAL LOW (ref 13.0–17.0)
Lymphocytes Relative: 6 %
Lymphs Abs: 1 10*3/uL (ref 0.7–4.0)
MCH: 30.7 pg (ref 26.0–34.0)
MCHC: 33.8 g/dL (ref 30.0–36.0)
MCV: 90.8 fL (ref 78.0–100.0)
Monocytes Absolute: 0.8 10*3/uL (ref 0.1–1.0)
Monocytes Relative: 5 %
Neutro Abs: 15 10*3/uL — ABNORMAL HIGH (ref 1.7–7.7)
Neutrophils Relative %: 89 %
Platelets: 200 10*3/uL (ref 150–400)
RBC: 3.16 MIL/uL — ABNORMAL LOW (ref 4.22–5.81)
RDW: 12.1 % (ref 11.5–15.5)
WBC: 16.8 10*3/uL — ABNORMAL HIGH (ref 4.0–10.5)

## 2016-07-23 LAB — POCT I-STAT 3, ART BLOOD GAS (G3+)
Acid-Base Excess: 3 mmol/L — ABNORMAL HIGH (ref 0.0–2.0)
Acid-Base Excess: 2 mmol/L (ref 0.0–2.0)
Acid-base deficit: 1 mmol/L (ref 0.0–2.0)
Bicarbonate: 27.8 mEq/L — ABNORMAL HIGH (ref 20.0–24.0)
Bicarbonate: 24.2 mEq/L — ABNORMAL HIGH (ref 20.0–24.0)
Bicarbonate: 24.6 mEq/L — ABNORMAL HIGH (ref 20.0–24.0)
Bicarbonate: 26.8 mEq/L — ABNORMAL HIGH (ref 20.0–24.0)
Bicarbonate: 24.9 mEq/L — ABNORMAL HIGH (ref 20.0–24.0)
O2 Saturation: 100 %
O2 Saturation: 100 %
O2 Saturation: 96 %
O2 Saturation: 100 %
O2 Saturation: 99 %
Patient temperature: 36.7
Patient temperature: 36.2
Patient temperature: 36.7
TCO2: 29 mmol/L (ref 0–100)
TCO2: 25 mmol/L (ref 0–100)
TCO2: 26 mmol/L (ref 0–100)
TCO2: 28 mmol/L (ref 0–100)
TCO2: 26 mmol/L (ref 0–100)
pCO2 arterial: 42.8 mmHg (ref 35.0–45.0)
pCO2 arterial: 38.2 mmHg (ref 35.0–45.0)
pCO2 arterial: 42.6 mmHg (ref 35.0–45.0)
pCO2 arterial: 41.3 mmHg (ref 35.0–45.0)
pCO2 arterial: 39.9 mmHg (ref 35.0–45.0)
pH, Arterial: 7.421 (ref 7.350–7.450)
pH, Arterial: 7.41 (ref 7.350–7.450)
pH, Arterial: 7.367 (ref 7.350–7.450)
pH, Arterial: 7.417 (ref 7.350–7.450)
pH, Arterial: 7.403 (ref 7.350–7.450)
pO2, Arterial: 422 mmHg — ABNORMAL HIGH (ref 80.0–100.0)
pO2, Arterial: 223 mmHg — ABNORMAL HIGH (ref 80.0–100.0)
pO2, Arterial: 83 mmHg (ref 80.0–100.0)
pO2, Arterial: 165 mmHg — ABNORMAL HIGH (ref 80.0–100.0)
pO2, Arterial: 157 mmHg — ABNORMAL HIGH (ref 80.0–100.0)

## 2016-07-23 LAB — CBC
HCT: 33.2 % — ABNORMAL LOW (ref 39.0–52.0)
Hemoglobin: 11.1 g/dL — ABNORMAL LOW (ref 13.0–17.0)
MCH: 30.7 pg (ref 26.0–34.0)
MCHC: 33.4 g/dL (ref 30.0–36.0)
MCV: 91.7 fL (ref 78.0–100.0)
PLATELETS: 239 10*3/uL (ref 150–400)
RBC: 3.62 MIL/uL — AB (ref 4.22–5.81)
RDW: 12.2 % (ref 11.5–15.5)
WBC: 24.3 10*3/uL — ABNORMAL HIGH (ref 4.0–10.5)

## 2016-07-23 LAB — VAS US DOPPLER PRE CABG
LEFT ECA DIAS: -10 cm/s
LEFT VERTEBRAL DIAS: 10 cm/s
Left CCA dist dias: 12 cm/s
Left CCA dist sys: 83 cm/s
Left CCA prox dias: 4 cm/s
Left CCA prox sys: 84 cm/s
Left ICA dist dias: -20 cm/s
Left ICA dist sys: -60 cm/s
Left ICA prox dias: 15 cm/s
Left ICA prox sys: 72 cm/s
RIGHT ECA DIAS: 0 cm/s
RIGHT VERTEBRAL DIAS: 6 cm/s
Right CCA prox dias: -2 cm/s
Right CCA prox sys: -99 cm/s
Right cca dist sys: -63 cm/s

## 2016-07-23 LAB — HEMOGLOBIN AND HEMATOCRIT, BLOOD
HCT: 26.8 % — ABNORMAL LOW (ref 39.0–52.0)
Hemoglobin: 9 g/dL — ABNORMAL LOW (ref 13.0–17.0)

## 2016-07-23 LAB — PROTIME-INR
INR: 1.44
PROTHROMBIN TIME: 17.7 s — AB (ref 11.4–15.2)

## 2016-07-23 LAB — GLUCOSE, CAPILLARY
Glucose-Capillary: 116 mg/dL — ABNORMAL HIGH (ref 65–99)
Glucose-Capillary: 121 mg/dL — ABNORMAL HIGH (ref 65–99)
Glucose-Capillary: 87 mg/dL (ref 65–99)
Glucose-Capillary: 100 mg/dL — ABNORMAL HIGH (ref 65–99)
Glucose-Capillary: 128 mg/dL — ABNORMAL HIGH (ref 65–99)
Glucose-Capillary: 118 mg/dL — ABNORMAL HIGH (ref 65–99)

## 2016-07-23 LAB — MAGNESIUM: Magnesium: 2.6 mg/dL — ABNORMAL HIGH (ref 1.7–2.4)

## 2016-07-23 LAB — POCT I-STAT 4, (NA,K, GLUC, HGB,HCT)
Glucose, Bld: 126 mg/dL — ABNORMAL HIGH (ref 65–99)
HCT: 30 % — ABNORMAL LOW (ref 39.0–52.0)
Hemoglobin: 10.2 g/dL — ABNORMAL LOW (ref 13.0–17.0)
Potassium: 3.5 mmol/L (ref 3.5–5.1)
Sodium: 142 mmol/L (ref 135–145)

## 2016-07-23 LAB — CREATININE, SERUM
Creatinine, Ser: 0.89 mg/dL (ref 0.61–1.24)
GFR calc Af Amer: >60 mL/min (ref >60)
GFR calc non Af Amer: >60 mL/min (ref >60)

## 2016-07-23 LAB — APTT: aPTT: 34 seconds (ref 24–36)

## 2016-07-23 LAB — PLATELET COUNT: Platelets: 218 10*3/uL (ref 150–400)

## 2016-07-23 SURGERY — CORONARY ARTERY BYPASS GRAFTING (CABG)
Anesthesia: General | Site: Chest

## 2016-07-23 MED ORDER — CHLORHEXIDINE GLUCONATE 0.12 % MT SOLN
15.0000 mL | OROMUCOSAL | Status: AC
  Administered 2016-07-23: 15 mL via OROMUCOSAL

## 2016-07-23 MED ORDER — SODIUM CHLORIDE 0.9 % IV SOLN
INTRAVENOUS | Status: DC
  Administered 2016-07-23: 15:00:00 via INTRAVENOUS

## 2016-07-23 MED ORDER — ALBUMIN HUMAN 5 % IV SOLN
INTRAVENOUS | Status: DC | PRN
  Administered 2016-07-23: 14:00:00 via INTRAVENOUS

## 2016-07-23 MED ORDER — ACETAMINOPHEN 650 MG RE SUPP
650.0000 mg | Freq: Once | RECTAL | Status: AC
  Administered 2016-07-23: 650 mg via RECTAL

## 2016-07-23 MED ORDER — HEMOSTATIC AGENTS (NO CHARGE) OPTIME
TOPICAL | Status: DC | PRN
Start: 2016-07-23 — End: 2016-07-23
  Administered 2016-07-23 (×2): 1 via TOPICAL

## 2016-07-23 MED ORDER — ASPIRIN EC 325 MG PO TBEC
325.0000 mg | DELAYED_RELEASE_TABLET | Freq: Every day | ORAL | Status: DC
  Administered 2016-07-24 – 2016-08-02 (×10): 325 mg via ORAL
  Filled 2016-07-23 (×10): qty 1

## 2016-07-23 MED ORDER — HEPARIN SODIUM (PORCINE) 1000 UNIT/ML IJ SOLN
INTRAMUSCULAR | Status: DC | PRN
  Administered 2016-07-23: 40000 [IU] via INTRAVENOUS

## 2016-07-23 MED ORDER — THROMBIN 5000 UNITS EX SOLR
CUTANEOUS | Status: AC
  Filled 2016-07-23: qty 5000

## 2016-07-23 MED ORDER — PROPOFOL 10 MG/ML IV BOLUS
INTRAVENOUS | Status: AC
  Filled 2016-07-23: qty 20

## 2016-07-23 MED ORDER — LEVOFLOXACIN IN D5W 750 MG/150ML IV SOLN
750.0000 mg | INTRAVENOUS | Status: AC
  Administered 2016-07-24: 750 mg via INTRAVENOUS
  Filled 2016-07-23: qty 150

## 2016-07-23 MED ORDER — ACETAMINOPHEN 160 MG/5ML PO SOLN: 650.0000 mg | Freq: Once | ORAL | Status: AC

## 2016-07-23 MED ORDER — ANTISEPTIC ORAL RINSE SOLUTION (CORINZ): 7.0000 mL | Freq: Four times a day (QID) | OROMUCOSAL | Status: DC

## 2016-07-23 MED ORDER — ATORVASTATIN CALCIUM 40 MG PO TABS
40.0000 mg | ORAL_TABLET | Freq: Every day | ORAL | Status: DC
  Administered 2016-07-24 – 2016-07-25 (×2): 40 mg via ORAL
  Filled 2016-07-23 (×2): qty 1

## 2016-07-23 MED ORDER — SODIUM CHLORIDE 0.9 % IV SOLN: 250.0000 mL | INTRAVENOUS | Status: DC

## 2016-07-23 MED ORDER — MAGNESIUM SULFATE 4 GM/100ML IV SOLN
4.0000 g | Freq: Once | INTRAVENOUS | Status: AC
  Administered 2016-07-23: 4 g via INTRAVENOUS
  Filled 2016-07-23: qty 100

## 2016-07-23 MED ORDER — LIDOCAINE 2% (20 MG/ML) 5 ML SYRINGE
INTRAMUSCULAR | Status: DC | PRN
  Administered 2016-07-23: 100 mg via INTRAVENOUS

## 2016-07-23 MED ORDER — PROTAMINE SULFATE 10 MG/ML IV SOLN
INTRAVENOUS | Status: DC | PRN
  Administered 2016-07-23: 320 mg via INTRAVENOUS

## 2016-07-23 MED ORDER — MIDAZOLAM HCL 2 MG/2ML IJ SOLN: 2.0000 mg | INTRAMUSCULAR | Status: DC | PRN

## 2016-07-23 MED ORDER — INSULIN REGULAR HUMAN 100 UNIT/ML IJ SOLN
INTRAMUSCULAR | Status: DC
  Administered 2016-07-23: 3.9 [IU]/h via INTRAVENOUS
  Administered 2016-07-23: 2.8 [IU]/h via INTRAVENOUS
  Filled 2016-07-23 (×2): qty 2.5

## 2016-07-23 MED ORDER — MIDAZOLAM HCL 5 MG/5ML IJ SOLN
INTRAMUSCULAR | Status: DC | PRN
  Administered 2016-07-23: 3 mg via INTRAVENOUS
  Administered 2016-07-23: 5 mg via INTRAVENOUS
  Administered 2016-07-23: 2 mg via INTRAVENOUS

## 2016-07-23 MED ORDER — ACETAMINOPHEN 160 MG/5ML PO SOLN: 1000.0000 mg | Freq: Four times a day (QID) | ORAL | Status: AC

## 2016-07-23 MED ORDER — SODIUM CHLORIDE 0.9 % IJ SOLN
OROMUCOSAL | Status: DC | PRN
  Administered 2016-07-23 (×3): 4 mL via TOPICAL

## 2016-07-23 MED ORDER — BISACODYL 10 MG RE SUPP: 10.0000 mg | Freq: Every day | RECTAL | Status: DC

## 2016-07-23 MED ORDER — ACETAMINOPHEN 500 MG PO TABS
1000.0000 mg | ORAL_TABLET | Freq: Four times a day (QID) | ORAL | Status: AC
  Administered 2016-07-24 – 2016-07-27 (×14): 1000 mg via ORAL
  Filled 2016-07-23 (×15): qty 2

## 2016-07-23 MED ORDER — EPHEDRINE SULFATE 50 MG/ML IJ SOLN
INTRAMUSCULAR | Status: AC
  Filled 2016-07-23: qty 1

## 2016-07-23 MED ORDER — MORPHINE SULFATE (PF) 2 MG/ML IV SOLN
2.0000 mg | INTRAVENOUS | Status: DC | PRN
  Administered 2016-07-23 – 2016-07-24 (×2): 2 mg via INTRAVENOUS
  Filled 2016-07-23 (×3): qty 1

## 2016-07-23 MED ORDER — ONDANSETRON HCL 4 MG/2ML IJ SOLN
4.0000 mg | Freq: Four times a day (QID) | INTRAMUSCULAR | Status: DC | PRN
  Administered 2016-07-24 – 2016-07-30 (×4): 4 mg via INTRAVENOUS
  Filled 2016-07-23 (×4): qty 2

## 2016-07-23 MED ORDER — ASPIRIN 81 MG PO CHEW: 324.0000 mg | CHEWABLE_TABLET | Freq: Every day | ORAL | Status: DC

## 2016-07-23 MED ORDER — DOCUSATE SODIUM 100 MG PO CAPS
200.0000 mg | ORAL_CAPSULE | Freq: Every day | ORAL | Status: DC
  Administered 2016-07-24 – 2016-07-27 (×4): 200 mg via ORAL
  Administered 2016-07-28: 100 mg via ORAL
  Administered 2016-07-29: 200 mg via ORAL
  Filled 2016-07-23 (×6): qty 2

## 2016-07-23 MED ORDER — SODIUM CHLORIDE 0.9% FLUSH
3.0000 mL | Freq: Two times a day (BID) | INTRAVENOUS | Status: DC
  Administered 2016-07-24 – 2016-08-02 (×16): 3 mL via INTRAVENOUS

## 2016-07-23 MED ORDER — 0.9 % SODIUM CHLORIDE (POUR BTL) OPTIME
TOPICAL | Status: DC | PRN
  Administered 2016-07-23: 6000 mL

## 2016-07-23 MED ORDER — VANCOMYCIN HCL IN DEXTROSE 1-5 GM/200ML-% IV SOLN
1000.0000 mg | Freq: Once | INTRAVENOUS | Status: AC
  Administered 2016-07-23: 1000 mg via INTRAVENOUS
  Filled 2016-07-23: qty 200

## 2016-07-23 MED ORDER — CETYLPYRIDINIUM CHLORIDE 0.05 % MT LIQD: 7.0000 mL | Freq: Two times a day (BID) | OROMUCOSAL | Status: DC

## 2016-07-23 MED ORDER — HEPARIN SODIUM (PORCINE) 1000 UNIT/ML IJ SOLN
INTRAMUSCULAR | Status: AC
  Filled 2016-07-23: qty 1

## 2016-07-23 MED ORDER — CHLORHEXIDINE GLUCONATE 0.12 % MT SOLN: 15.0000 mL | Freq: Once | OROMUCOSAL | Status: DC

## 2016-07-23 MED ORDER — METOPROLOL TARTRATE 5 MG/5ML IV SOLN
2.5000 mg | INTRAVENOUS | Status: DC | PRN
  Administered 2016-07-25 (×2): 2.5 mg via INTRAVENOUS
  Filled 2016-07-23 (×3): qty 5

## 2016-07-23 MED ORDER — PROTAMINE SULFATE 10 MG/ML IV SOLN
INTRAVENOUS | Status: AC
  Filled 2016-07-23: qty 10

## 2016-07-23 MED ORDER — CHLORHEXIDINE GLUCONATE 0.12% ORAL RINSE (MEDLINE KIT)
15.0000 mL | Freq: Two times a day (BID) | OROMUCOSAL | Status: DC
  Administered 2016-07-23: 15 mL via OROMUCOSAL

## 2016-07-23 MED ORDER — LACTATED RINGERS IV SOLN
INTRAVENOUS | Status: DC | PRN
  Administered 2016-07-23: 07:00:00 via INTRAVENOUS

## 2016-07-23 MED ORDER — MIDAZOLAM HCL 10 MG/2ML IJ SOLN
INTRAMUSCULAR | Status: AC
  Filled 2016-07-23: qty 2

## 2016-07-23 MED ORDER — SODIUM CHLORIDE 0.9% FLUSH
3.0000 mL | INTRAVENOUS | Status: DC | PRN
  Administered 2016-07-26: 3 mL via INTRAVENOUS
  Filled 2016-07-23: qty 3

## 2016-07-23 MED ORDER — LACTATED RINGERS IV SOLN
INTRAVENOUS | Status: DC
  Administered 2016-07-23: 15:00:00 via INTRAVENOUS

## 2016-07-23 MED ORDER — TRAMADOL HCL 50 MG PO TABS
50.0000 mg | ORAL_TABLET | ORAL | Status: DC | PRN
  Administered 2016-08-01: 50 mg via ORAL
  Filled 2016-07-23: qty 2
  Filled 2016-07-23: qty 1

## 2016-07-23 MED ORDER — ALBUMIN HUMAN 5 % IV SOLN
250.0000 mL | INTRAVENOUS | Status: AC | PRN
  Administered 2016-07-23 – 2016-07-24 (×4): 250 mL via INTRAVENOUS
  Filled 2016-07-23 (×2): qty 250

## 2016-07-23 MED ORDER — CHLORHEXIDINE GLUCONATE 4 % EX LIQD: 30.0000 mL | CUTANEOUS | Status: DC

## 2016-07-23 MED ORDER — NITROGLYCERIN 0.2 MG/ML ON CALL CATH LAB
INTRAVENOUS | Status: DC | PRN
  Administered 2016-07-23: 20 ug via INTRAVENOUS
  Administered 2016-07-23 (×2): 40 ug via INTRAVENOUS
  Administered 2016-07-23: 20 ug via INTRAVENOUS
  Administered 2016-07-23: 40 ug via INTRAVENOUS
  Administered 2016-07-23: 20 ug via INTRAVENOUS

## 2016-07-23 MED ORDER — BISACODYL 5 MG PO TBEC
10.0000 mg | DELAYED_RELEASE_TABLET | Freq: Every day | ORAL | Status: DC
  Administered 2016-07-24 – 2016-07-27 (×4): 10 mg via ORAL
  Filled 2016-07-23 (×4): qty 2

## 2016-07-23 MED ORDER — LACTATED RINGERS IV SOLN
INTRAVENOUS | Status: DC | PRN
  Administered 2016-07-23: 08:00:00 via INTRAVENOUS

## 2016-07-23 MED ORDER — FAMOTIDINE IN NACL 20-0.9 MG/50ML-% IV SOLN
20.0000 mg | Freq: Two times a day (BID) | INTRAVENOUS | Status: AC
  Administered 2016-07-23: 20 mg via INTRAVENOUS

## 2016-07-23 MED ORDER — LACTATED RINGERS IV SOLN
INTRAVENOUS | Status: DC | PRN
Start: 2016-07-23 — End: 2016-07-23
  Administered 2016-07-23: 07:00:00 via INTRAVENOUS

## 2016-07-23 MED ORDER — MORPHINE SULFATE (PF) 2 MG/ML IV SOLN
1.0000 mg | INTRAVENOUS | Status: AC | PRN
  Administered 2016-07-23: 2 mg via INTRAVENOUS

## 2016-07-23 MED ORDER — FENTANYL CITRATE (PF) 250 MCG/5ML IJ SOLN
INTRAMUSCULAR | Status: AC
  Filled 2016-07-23: qty 25

## 2016-07-23 MED ORDER — SODIUM CHLORIDE 0.45 % IV SOLN
INTRAVENOUS | Status: DC | PRN
  Administered 2016-07-23 – 2016-07-26 (×2): via INTRAVENOUS

## 2016-07-23 MED ORDER — PHENYLEPHRINE HCL 10 MG/ML IJ SOLN
0.0000 ug/min | INTRAVENOUS | Status: DC
  Filled 2016-07-23: qty 2

## 2016-07-23 MED ORDER — NITROGLYCERIN IN D5W 200-5 MCG/ML-% IV SOLN
0.0000 ug/min | INTRAVENOUS | Status: DC
Start: 2016-07-23 — End: 2016-07-26

## 2016-07-23 MED ORDER — ROCURONIUM BROMIDE 10 MG/ML (PF) SYRINGE
PREFILLED_SYRINGE | INTRAVENOUS | Status: AC
  Filled 2016-07-23: qty 30

## 2016-07-23 MED ORDER — PANTOPRAZOLE SODIUM 40 MG PO TBEC
40.0000 mg | DELAYED_RELEASE_TABLET | Freq: Every day | ORAL | Status: DC
  Administered 2016-07-25 – 2016-08-02 (×8): 40 mg via ORAL
  Filled 2016-07-23 (×9): qty 1

## 2016-07-23 MED ORDER — METOPROLOL TARTRATE 12.5 MG HALF TABLET
12.5000 mg | ORAL_TABLET | Freq: Two times a day (BID) | ORAL | Status: DC
  Administered 2016-07-24 – 2016-07-25 (×4): 12.5 mg via ORAL
  Filled 2016-07-23 (×4): qty 1

## 2016-07-23 MED ORDER — PROPOFOL 10 MG/ML IV BOLUS
INTRAVENOUS | Status: DC | PRN
  Administered 2016-07-23: 80 mg via INTRAVENOUS
  Administered 2016-07-23: 70 mg via INTRAVENOUS

## 2016-07-23 MED ORDER — PHENYLEPHRINE 40 MCG/ML (10ML) SYRINGE FOR IV PUSH (FOR BLOOD PRESSURE SUPPORT)
PREFILLED_SYRINGE | INTRAVENOUS | Status: DC | PRN
  Administered 2016-07-23: 20 ug via INTRAVENOUS
  Administered 2016-07-23: 40 ug via INTRAVENOUS
  Administered 2016-07-23: 20 ug via INTRAVENOUS
  Administered 2016-07-23: 120 ug via INTRAVENOUS
  Administered 2016-07-23: 40 ug via INTRAVENOUS

## 2016-07-23 MED ORDER — SODIUM CHLORIDE 0.9 % IJ SOLN
INTRAMUSCULAR | Status: AC
  Filled 2016-07-23: qty 10

## 2016-07-23 MED ORDER — METOPROLOL TARTRATE 25 MG/10 ML ORAL SUSPENSION: 12.5000 mg | Freq: Two times a day (BID) | ORAL | Status: DC

## 2016-07-23 MED ORDER — ROCURONIUM BROMIDE 10 MG/ML (PF) SYRINGE
PREFILLED_SYRINGE | INTRAVENOUS | Status: DC | PRN
  Administered 2016-07-23: 100 mg via INTRAVENOUS
  Administered 2016-07-23: 30 mg via INTRAVENOUS
  Administered 2016-07-23: 50 mg via INTRAVENOUS
  Administered 2016-07-23: 20 mg via INTRAVENOUS
  Administered 2016-07-23: 40 mg via INTRAVENOUS

## 2016-07-23 MED ORDER — LACTATED RINGERS IV SOLN
500.0000 mL | Freq: Once | INTRAVENOUS | Status: AC | PRN
  Administered 2016-07-23: 500 mL via INTRAVENOUS

## 2016-07-23 MED ORDER — METOPROLOL TARTRATE 12.5 MG HALF TABLET: 12.5000 mg | ORAL_TABLET | Freq: Once | ORAL | Status: DC

## 2016-07-23 MED ORDER — PROTAMINE SULFATE 10 MG/ML IV SOLN
INTRAVENOUS | Status: AC
Start: 2016-07-23 — End: 2016-07-23
  Filled 2016-07-23: qty 25

## 2016-07-23 MED ORDER — OXYCODONE HCL 5 MG PO TABS: 5.0000 mg | ORAL_TABLET | ORAL | Status: DC | PRN

## 2016-07-23 MED ORDER — LIDOCAINE 2% (20 MG/ML) 5 ML SYRINGE
INTRAMUSCULAR | Status: AC
  Filled 2016-07-23: qty 5

## 2016-07-23 MED ORDER — DEXMEDETOMIDINE HCL IN NACL 200 MCG/50ML IV SOLN
0.0000 ug/kg/h | INTRAVENOUS | Status: DC
  Administered 2016-07-23: 0.5 ug/kg/h via INTRAVENOUS
  Filled 2016-07-23 (×2): qty 50

## 2016-07-23 MED ORDER — INSULIN REGULAR BOLUS VIA INFUSION
0.0000 [IU] | Freq: Three times a day (TID) | INTRAVENOUS | Status: DC
  Filled 2016-07-23: qty 10

## 2016-07-23 MED ORDER — POTASSIUM CHLORIDE 10 MEQ/50ML IV SOLN
10.0000 meq | INTRAVENOUS | Status: AC
  Administered 2016-07-23 (×3): 10 meq via INTRAVENOUS

## 2016-07-23 MED ORDER — FENTANYL CITRATE (PF) 250 MCG/5ML IJ SOLN
INTRAMUSCULAR | Status: DC | PRN
  Administered 2016-07-23 (×2): 200 ug via INTRAVENOUS
  Administered 2016-07-23 (×2): 50 ug via INTRAVENOUS
  Administered 2016-07-23: 150 ug via INTRAVENOUS
  Administered 2016-07-23: 500 ug via INTRAVENOUS
  Administered 2016-07-23: 100 ug via INTRAVENOUS

## 2016-07-23 MED ORDER — THROMBIN 5000 UNITS EX SOLR
CUTANEOUS | Status: DC | PRN
  Administered 2016-07-23: 5000 [IU] via TOPICAL

## 2016-07-23 MED FILL — Heparin Sodium (Porcine) Inj 1000 Unit/ML: INTRAMUSCULAR | Qty: 30 | Status: AC

## 2016-07-23 MED FILL — Potassium Chloride Inj 2 mEq/ML: INTRAVENOUS | Qty: 40 | Status: AC

## 2016-07-23 MED FILL — Lidocaine HCl IV Inj 20 MG/ML: INTRAVENOUS | Qty: 5 | Status: AC

## 2016-07-23 MED FILL — Heparin Sodium (Porcine) Inj 1000 Unit/ML: INTRAMUSCULAR | Qty: 20 | Status: AC

## 2016-07-23 MED FILL — Electrolyte-R (PH 7.4) Solution: INTRAVENOUS | Qty: 5000 | Status: AC

## 2016-07-23 MED FILL — Sodium Bicarbonate IV Soln 8.4%: INTRAVENOUS | Qty: 50 | Status: AC

## 2016-07-23 MED FILL — Magnesium Sulfate Inj 50%: INTRAMUSCULAR | Qty: 10 | Status: AC

## 2016-07-23 MED FILL — Sodium Chloride IV Soln 0.9%: INTRAVENOUS | Qty: 2000 | Status: AC

## 2016-07-23 MED FILL — Mannitol IV Soln 20%: INTRAVENOUS | Qty: 500 | Status: AC

## 2016-07-23 SURGICAL SUPPLY — 74 items
AGENT HMST MTR 8 SURGIFLO (HEMOSTASIS) ×2
BAG DECANTER FOR FLEXI CONT (MISCELLANEOUS) ×3 IMPLANT
BANDAGE ELASTIC 4 VELCRO ST LF (GAUZE/BANDAGES/DRESSINGS) ×3 IMPLANT
BANDAGE ELASTIC 6 VELCRO ST LF (GAUZE/BANDAGES/DRESSINGS) ×3 IMPLANT
BLADE STERNUM SYSTEM 6 (BLADE) ×3 IMPLANT
BNDG GAUZE ELAST 4 BULKY (GAUZE/BANDAGES/DRESSINGS) ×3 IMPLANT
CANISTER SUCTION 2500CC (MISCELLANEOUS) ×3 IMPLANT
CATH CPB KIT GERHARDT (MISCELLANEOUS) ×3 IMPLANT
CATH THORACIC 28FR (CATHETERS) ×3 IMPLANT
CLIP FOGARTY SPRING 6M (CLIP) ×1 IMPLANT
COVER SURGICAL LIGHT HANDLE (MISCELLANEOUS) ×1 IMPLANT
CRADLE DONUT ADULT HEAD (MISCELLANEOUS) ×3 IMPLANT
DRAIN CHANNEL 28F RND 3/8 FF (WOUND CARE) ×3 IMPLANT
DRAPE CARDIOVASCULAR INCISE (DRAPES) ×3
DRAPE SLUSH/WARMER DISC (DRAPES) ×3 IMPLANT
DRAPE SRG 135X102X78XABS (DRAPES) ×2 IMPLANT
DRSG AQUACEL AG ADV 3.5X14 (GAUZE/BANDAGES/DRESSINGS) ×3 IMPLANT
ELECT BLADE 4.0 EZ CLEAN MEGAD (MISCELLANEOUS) ×3
ELECT REM PT RETURN 9FT ADLT (ELECTROSURGICAL) ×6
ELECTRODE BLDE 4.0 EZ CLN MEGD (MISCELLANEOUS) ×2 IMPLANT
ELECTRODE REM PT RTRN 9FT ADLT (ELECTROSURGICAL) ×4 IMPLANT
FELT TEFLON 1X6 (MISCELLANEOUS) ×5 IMPLANT
GAUZE SPONGE 4X4 12PLY STRL (GAUZE/BANDAGES/DRESSINGS) ×6 IMPLANT
GLOVE BIO SURGEON STRL SZ 6.5 (GLOVE) ×12 IMPLANT
GLOVE BIOGEL M STER SZ 6 (GLOVE) ×4 IMPLANT
GLOVE BIOGEL M STRL SZ7.5 (GLOVE) ×2 IMPLANT
GLOVE BIOGEL PI IND STRL 6.5 (GLOVE) IMPLANT
GLOVE BIOGEL PI INDICATOR 6.5 (GLOVE) ×2
GOWN SPEC L4 XLG W/TWL (GOWN DISPOSABLE) ×2 IMPLANT
GOWN STRL REUS W/ TWL LRG LVL3 (GOWN DISPOSABLE) ×8 IMPLANT
GOWN STRL REUS W/TWL LRG LVL3 (GOWN DISPOSABLE) ×18
HEMOSTAT POWDER SURGIFOAM 1G (HEMOSTASIS) ×9 IMPLANT
HEMOSTAT SURGICEL 2X14 (HEMOSTASIS) ×3 IMPLANT
KIT BASIN OR (CUSTOM PROCEDURE TRAY) ×3 IMPLANT
KIT CATH SUCT 8FR (CATHETERS) ×3 IMPLANT
KIT ROOM TURNOVER OR (KITS) ×3 IMPLANT
KIT SUCTION CATH 14FR (SUCTIONS) ×6 IMPLANT
KIT VASOVIEW 6 PRO VH 2400 (KITS) ×2 IMPLANT
KIT VASOVIEW HEMOPRO VH 3000 (KITS) ×1 IMPLANT
LEAD PACING MYOCARDI (MISCELLANEOUS) ×3 IMPLANT
MARKER GRAFT CORONARY BYPASS (MISCELLANEOUS) ×9 IMPLANT
NS IRRIG 1000ML POUR BTL (IV SOLUTION) ×16 IMPLANT
PACK OPEN HEART (CUSTOM PROCEDURE TRAY) ×3 IMPLANT
PAD ARMBOARD 7.5X6 YLW CONV (MISCELLANEOUS) ×6 IMPLANT
PAD ELECT DEFIB RADIOL ZOLL (MISCELLANEOUS) ×3 IMPLANT
PENCIL BUTTON HOLSTER BLD 10FT (ELECTRODE) ×3 IMPLANT
PUNCH AORTIC ROTATE  4.5MM 8IN (MISCELLANEOUS) ×1 IMPLANT
SET CARDIOPLEGIA MPS 5001102 (MISCELLANEOUS) ×1 IMPLANT
SPOGE SURGIFLO 8M (HEMOSTASIS) ×1
SPONGE GAUZE 4X4 12PLY STER LF (GAUZE/BANDAGES/DRESSINGS) ×2 IMPLANT
SPONGE LAP 18X18 X RAY DECT (DISPOSABLE) ×3 IMPLANT
SPONGE SURGIFLO 8M (HEMOSTASIS) IMPLANT
SUT BONE WAX W31G (SUTURE) ×3 IMPLANT
SUT MNCRL AB 4-0 PS2 18 (SUTURE) ×1 IMPLANT
SUT PROLENE 3 0 SH1 36 (SUTURE) ×3 IMPLANT
SUT PROLENE 4 0 TF (SUTURE) ×6 IMPLANT
SUT PROLENE 6 0 CC (SUTURE) ×8 IMPLANT
SUT PROLENE 7 0 BV1 MDA (SUTURE) ×5 IMPLANT
SUT PROLENE 8 0 BV175 6 (SUTURE) ×3 IMPLANT
SUT SILK 4 0 REEL (SUTURE) ×1 IMPLANT
SUT STEEL 6MS V (SUTURE) ×3 IMPLANT
SUT STEEL SZ 6 DBL 3X14 BALL (SUTURE) ×3 IMPLANT
SUT VIC AB 1 CTX 18 (SUTURE) ×6 IMPLANT
SUT VIC AB 3-0 SH 27 (SUTURE) ×3
SUT VIC AB 3-0 SH 27X BRD (SUTURE) IMPLANT
SUTURE E-PAK OPEN HEART (SUTURE) ×3 IMPLANT
SYSTEM SAHARA CHEST DRAIN ATS (WOUND CARE) ×3 IMPLANT
TAPE CLOTH SURG 4X10 WHT LF (GAUZE/BANDAGES/DRESSINGS) ×1 IMPLANT
TOWEL OR 17X24 6PK STRL BLUE (TOWEL DISPOSABLE) ×6 IMPLANT
TOWEL OR 17X26 10 PK STRL BLUE (TOWEL DISPOSABLE) ×6 IMPLANT
TRAY FOLEY IC TEMP SENS 16FR (CATHETERS) ×3 IMPLANT
TUBING INSUFFLATION (TUBING) ×3 IMPLANT
UNDERPAD 30X30 INCONTINENT (UNDERPADS AND DIAPERS) ×3 IMPLANT
WATER STERILE IRR 1000ML POUR (IV SOLUTION) ×6 IMPLANT

## 2016-07-23 NOTE — Progress Notes (Signed)
      [address]       [place] [address]             [phone]      Intubated, sedated  BP 112/81 (BP Location: Right Arm)   Pulse 89   Temp 97.5 F (36.4 C) (Core (Comment)) Comment (Src): swan  Resp 12   Ht 5\' 9"  (1.753 m)   Wt 205 lb (93 kg)   SpO2 99%   BMI 30.27 kg/m   PA 17/9 CI= 2.3   Intake/Output Summary (Last 24 hours) at 07/23/16 1647 Last data filed at 07/23/16 1600  Gross per 24 hour  Intake          2874.61 ml  Output             3045 ml  Net          -170.39 ml    Minimal CT output  Stable early postop, wean to extubate  Remo Lipps C. Roxan Hockey, MD [facility] [phone]

## 2016-07-23 NOTE — Progress Notes (Signed)
Dr. Roxan Hockey made rounds. Informed about dopamine order, PAD pressures being down and labwork. No new orders. Will continue to monitor.

## 2016-07-23 NOTE — OR Nursing (Signed)
Forty-five minute call to SICU charge nurse at 1316.

## 2016-07-23 NOTE — H&P (Signed)
[address]       [place] [address]             [phone]                    Teshaun R Caffrey [facility] [place] Medical Record [id number] Date of Birth: Jul 04, 1946  Referring: Dr Harl Bowie, cath  by Dr Lonia Blood, discharging MD  Dr Gwenlyn Found Primary Cardiology: Dr Stanford Breed  Chief Complaint:    CAD   History of Present Illness:     Patient admitted this several days ago through ER with substernal tightness, denies pain, no nausea or diaphoresis.     He was in the hospital  from 07/10/2016 - 07/12/2016 for chest tightness  as well. His symptoms were thought to have both atypical and typical components, stress test was done was initially. This showed no evidence of reversible ischemia but a moderate region of fixed defect in the inferolateral wall extending to the true apex suggesting an area of prior infarct or scarring versus diaphragmatic attenuation artifact. Dr. Haroldine Laws reviewed this along with a Chest CT which showed 3c coronary calcifications and recommended a cardiac catheterization. Mildly dilated ascending aorta was noted 4.1 cm. The  CT scan was done in march 2017 in Urology office  to RO bony mets from prostate cancer.  Cardiac cath was on 07/12/2016 and showed 3-vessel CAD (LAD with 50% ostial, 80% mid, and 70% distal disease, 90% very small OM1, 50% mid RCA, 70% very small PDA, 80% PLOM. Aggressive medical therapy was recommended and he was started on ASA, Amlodipine, BB, and a statin. The patient is  unsure about his meds but  appears he was stared on statin,  lopressor 12.5 mg bid and Norvasc 5mg  for bp 220/120 on admission. Dr Lonia Blood  recommended  medical therapy, and  if he continued to have angina consider CABG. Since being discharged five days ago, he reports doing well until today . Had been taking his new medications as prescribed without any symptoms. This morning, he developed pain around 0400 which awoke him from sleep. Says it has been constant since but increasing in  severity. No association with rest or exertion. Had mild diaphoresis this AM. No nausea, vomiting, or dyspnea.   While in the ED, labs have showna WBC of 16.4, Hgb 14.5, platelets 282. Electrolytes WNL. Creatinine 1.18. BNP 50.1. Initial troponin negative. CXR with no active cardiopulmonary disease. EKG shows NSR, HR 77, with RBBB and down-sloping ST in Leads I and II.    TNM code: pT3a, pN0 prostate cancer resected 04/2016- discussed with Dr Alinda Money, no problem with placement of foley at surgery       Current Activity/ Functional Status:  Patient is independent with mobility/ambulation, transfers, ADL's, IADL's.   Zubrod Score: At the time of surgery this patient's most appropriate activity status/level should be described as: []     0    Normal activity, no symptoms [x]     1    Restricted in physical strenuous activity but ambulatory, able to do out light work []     2    Ambulatory and capable of self care, unable to do work activities, up and about               >50 % of waking hours                              []   3    Only limited self care, in bed greater than 50% of waking hours []     4    Completely disabled, no self care, confined to bed or chair []     5    Moribund   Past Medical History:  Diagnosis Date  . Allergy   . Anginal pain ([place])   . Borderline diabetes   . Bronchitis    recurrent  . CAD in native artery    a. Multivessel CAD by cath 07/2016 - tx with medical therapy with consideration of CABG for refractory angina.  . Essential hypertension   . Fracture, finger    LEFT HAND WITH SWELLING AND BRUISING  . Heart murmur    ? teen  . History of gout   . History of kidney stones   . History of kidney stones   . History of skin cancer    possible - pt not sure of dx  . Hyperlipidemia   . Pneumonia    hx  . Prostate cancer [facility])    a. s/p radical prostatectomy 04/2016.  Marland Kitchen Sinus infection    recurrent    Past Surgical History:  Procedure Laterality  Date  . CARDIAC CATHETERIZATION N/A 07/12/2016   Procedure: Left Heart Cath and Coronary Angiography;  Surgeon: Peter M Martinique, MD;  Location: [facility] [facility];  Service: Cardiovascular;  Laterality: N/A;  . EYE SURGERY     B cataracts removed.  Marland Kitchen EYE SURGERY     lens implants  . LITHOTRIPSY     89  . LYMPHADENECTOMY Bilateral 04/05/2016   Procedure: LYMPHADENECTOMY, BILATERAL PELVIC LYMPHADENECTOMY;  Surgeon: Raynelle Bring, MD;  Location: [facility];  Service: Urology;  Laterality: Bilateral;  . PROSTATE BIOPSY    . ROBOT ASSISTED LAPAROSCOPIC RADICAL PROSTATECTOMY N/A 04/05/2016   Procedure: XI ROBOTIC ASSISTED LAPAROSCOPIC RADICAL PROSTATECTOMY LEVEL 2;  Surgeon: Raynelle Bring, MD;  Location: [facility];  Service: Urology;  Laterality: N/A;  . TONSILLECTOMY AND ADENOIDECTOMY     also received radiation treatments, as was frequent in children at the time  . WISDOM TOOTH EXTRACTION      Family History  Problem Relation Age of Onset  . Heart attack Father   . Heart disease Father 6    AMI as cause of death  . Diabetes Father   . Hyperlipidemia Father   . Parkinson's disease Brother   . Diabetes Brother   . Cancer Brother     melanoma retina  . Heart disease Brother   . Stroke Maternal Grandmother   . Cancer Mother 11    Hodgkins   . Glaucoma Mother   . Colon cancer Neg Hx   . Colon polyps Neg Hx     Social History   Social History  . Marital status: Married    Spouse name: [name]  . Number of children: 3  . Years of education: N/A   Occupational History  . 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 Smoking status: Never Smoker  . Smokeless tobacco: Never Used  . Alcohol use 1.2 oz/week    2 Standard drinks or equivalent per week     Comment: wine - red 2 glasses wine per week  . Drug use: No  . Sexual activity: Yes    Partners: Female   Other Topics Concern  . Not on file   Social History Narrative     Marital status:  married x 25 years; second marriage; first marriage x 12 years.      Children: 3 daughters and one step-daughter; 4 grandchildren      Lives: with wife.      Employment: semi-retired; Company secretary; professor; Social worker, Probation officer.      Tobacco:  no      Alcohol: about 2 glasses of wine/week      Exercise: active in the yard.    History  Smoking Status  . Never Smoker  Smokeless Tobacco  . Never Used    History  Alcohol Use  . 1.2 oz/week  . 2 Standard drinks or equivalent per week    Comment: wine - red 2 glasses wine per week     Allergies  Allergen Reactions  . Penicillins Swelling and Other (See Comments)    As a child. Has patient had a PCN reaction causing immediate rash, facial/tongue/throat swelling, SOB or lightheadedness with hypotension: Yes Has patient had a PCN reaction causing severe rash involving mucus membranes or skin necrosis: No Has patient had a PCN reaction that required hospitalization: Was already in hospital when reaction happened Has patient had a PCN reaction occurring within the last 10 years: No  If all of the above answers are "NO", then may proceed with Cephalosporin use.     Current Facility-Administered Medications  Medication Dose Route Frequency Provider Last Rate Last Dose  . aminocaproic acid (AMICAR) 10 g in sodium chloride 0.9 % 100 mL infusion   Intravenous To OR Skeet Simmer, RPH      . chlorhexidine (HIBICLENS) 4 % liquid 2 application  30 mL Topical UD Grace Isaac, MD      . Derrill Memo ON 07/24/2016] chlorhexidine (PERIDEX) 0.12 % solution 15 mL  15 mL Mouth/Throat Once Grace Isaac, MD      . dexmedetomidine (PRECEDEX) 400 MCG/100ML (4 mcg/mL) infusion  0.1-0.7 mcg/kg/hr Intravenous To OR Skeet Simmer, RPH      . DOPamine (INTROPIN) 800 mg in dextrose 5 % 250 mL (3.2 mg/mL) infusion  0-10 mcg/kg/min Intravenous To OR Skeet Simmer, RPH      . EPINEPHrine (ADRENALIN) 4 mg in dextrose 5 % 250 mL (0.016 mg/mL)  infusion  0-10 mcg/min Intravenous To OR Skeet Simmer, RPH      . heparin 2,500 Units, papaverine 30 mg in electrolyte-148 (PLASMALYTE-148) 500 mL irrigation   Irrigation To OR Skeet Simmer, RPH      . heparin 30,000 units/NS 1000 mL solution for CELLSAVER   Other To OR Skeet Simmer, [facility]      . insulin regular (NOVOLIN R,HUMULIN R) 250 Units in sodium chloride 0.9 % 250 mL (1 Units/mL) infusion   Intravenous To OR Skeet Simmer, RPH      . levofloxacin (LEVAQUIN) IVPB 500 mg  500 mg Intravenous To OR Grace Isaac, MD      . magnesium sulfate (IV Push/IM) injection 40 mEq  40 mEq Other To OR Skeet Simmer, RPH      . metoprolol tartrate (LOPRESSOR) tablet 12.5 mg  12.5 mg Oral Once Grace Isaac, MD      . nitroGLYCERIN 50 mg in dextrose 5 % 250 mL (0.2 mg/mL) infusion  2-200 mcg/min Intravenous To OR Skeet Simmer, RPH      . phenylephrine (NEO-SYNEPHRINE) 20 mg in dextrose 5 % 250 mL (0.08 mg/mL) infusion  30-200 mcg/min Intravenous To OR Skeet Simmer, RPH      .  potassium chloride injection 80 mEq  80 mEq Other To OR Skeet Simmer, [facility]      . vancomycin (VANCOCIN) 1,250 mg in sodium chloride 0.9 % 250 mL IVPB  1,250 mg Intravenous To OR Skeet Simmer, [place]       Facility-Administered Medications Ordered in Other Encounters  Medication Dose Route Frequency Provider Last Rate Last Dose  . fentaNYL (SUBLIMAZE) injection   Intravenous Anesthesia Intra-op Colin Benton, CRNA   50 mcg at 07/23/16 [id number]  . lactated ringers infusion    Continuous PRN Colin Benton, CRNA      . lactated ringers infusion    Continuous PRN Colin Benton, CRNA      . lactated ringers infusion    Continuous PRN Colin Benton, CRNA      . midazolam (VERSED) 5 MG/5ML injection    Anesthesia Intra-op Colin Benton, CRNA   2 mg at 07/23/16 [id number]    Review of Systems:     Cardiac Review of Systems: Y or N  Chest Pain [ y   ]  Resting SOB [n   ] Exertional SOB  [ y ]  Orthopnea [ n ]   Pedal  Edema [ n  ]    Palpitations [n  ] Syncope  [ n ]   Presyncope [n   ]  General Review of Systems: [Y] = yes [  ]=no Constitional: recent weight change [  ];  Wt loss over the last 3 months [   ] anorexia [  ]; fatigue [  ]; nausea [  ]; night sweats [  ]; fever [  ]; or chills [  ];          Dental: poor dentition[  ]; Last Dentist visit:   Eye : blurred vision [ n ]; diplopia [   ]; vision changes [  ];  Amaurosis fugax[n  ]; Resp: cough [  ];  wheezing[  ];  hemoptysis[  ]; shortness of breath[  ]; paroxysmal nocturnal dyspnea[  ]; dyspnea on exertion[  ]; or orthopnea[  ];  GI:  gallstones[  ], vomiting[  ];  dysphagia[  ]; melena[  ];  hematochezia [  ]; heartburn[  ];   Hx of  Colonoscopy[  ]; GU: kidney stones [  ]; hematuria[  ];   dysuria [  ];  nocturia[  ];  history of     obstruction [  ]; urinary frequency [  ]             Skin: rash, swelling[  ];, hair loss[  ];  peripheral edema[  ];  or itching[  ]; Musculosketetal: myalgias[  ];  joint swelling[  ];  joint erythema[  ];  joint pain[  ];  back pain[  ];  Heme/Lymph: bruising[  ];  bleeding[  ];  anemia[  ];  Neuro: TIA[ n ];  headaches[  ];  stroke[n  ];  vertigo[  ];  seizures[  ];   paresthesias[  ];  difficulty walking[ n ];  Psych:depression[  ]; anxiety[  ];  Endocrine: diabetes[ y ];  thyroid dysfunction[n  ];  Immunizations: Flu up to date [ n ]; Pneumococcal up to date n[  ];  Other:  Physical Exam: BP 127/66   Pulse (!) 59   Temp 98.2 F (36.8 C) (Oral)   Resp 20   Ht 5\' 9"  (1.753 m)   Wt 205 lb (93 kg)  SpO2 99%   BMI 30.27 kg/m   PHYSICAL EXAMINATION: General appearance: alert and cooperative Head: Normocephalic, without obvious abnormality, atraumatic Neck: no adenopathy, no carotid bruit, no JVD, supple, symmetrical, trachea midline and thyroid not enlarged, symmetric, no tenderness/mass/nodules Lymph nodes: Cervical, supraclavicular, and axillary nodes normal. Resp: clear to auscultation  bilaterally Back: symmetric, no curvature. ROM normal. No CVA tenderness. Cardio: regular rate and rhythm, S1, S2 normal, no murmur, click, rub or gallop GI: soft, non-tender; bowel sounds normal; no masses,  no organomegaly Extremities: extremities normal, atraumatic, no cyanosis or edema and Homans sign is negative, no sign of DVT Neurologic: Grossly normal    Diagnostic Studies & Laboratory data:     Recent Radiology Findings:   Dg Chest 2 View  Result Date: 07/17/2016 CLINICAL DATA:  Patient with chest pain and pressure. EXAM: CHEST  2 VIEW COMPARISON:  Chest radiograph 07/10/2016 FINDINGS: Normal cardiac and mediastinal contours. No consolidative pulmonary opacities. No pleural effusion pneumothorax. Thoracic spine degenerative changes. IMPRESSION: No active cardiopulmonary disease. Electronically Signed   By: Lovey Newcomer M.D.   On: 07/17/2016 09:30   Nm Myocar Multi W/spect W/wall Motion / Ef  Result Date: 07/11/2016 CLINICAL DATA:  70 year old male with new onset chest pressure EXAM: MYOCARDIAL IMAGING WITH SPECT (REST AND PHARMACOLOGIC-STRESS) GATED LEFT VENTRICULAR WALL MOTION STUDY LEFT VENTRICULAR EJECTION FRACTION TECHNIQUE: Standard myocardial SPECT imaging was performed after resting intravenous injection of 10 mCi Tc-32m tetrofosmin. Subsequently, intravenous infusion of Lexiscan was performed under the supervision of the Cardiology staff. At peak effect of the drug, 30 mCi Tc-45m tetrofosmin was injected intravenously and standard myocardial SPECT imaging was performed. Quantitative gated imaging was also performed to evaluate left ventricular wall motion, and estimate left ventricular ejection fraction. COMPARISON:  Chest x-ray 07/10/2016 FINDINGS: Perfusion: No decreased activity in the left ventricle on stress imaging to suggest reversible ischemia. There is a region of mildly attenuated radiotracer activity in the inferolateral wall extending to the true apex both at rest and  during stress suggesting a region of prior infarct or scarring. Diaphragmatic attenuation artifact is also possible. Wall Motion: Normal left ventricular wall motion. No left ventricular dilation. Left Ventricular Ejection Fraction: 57 % End diastolic volume 47 ml End systolic volume 20 ml IMPRESSION: 1. No evidence of reversible ischemia. Moderate region of fixed defect in the inferolateral wall extending to the true apex suggests an area of prior infarct or scarring versus diaphragmatic attenuation artifact. 2. Normal left ventricular wall motion. 3. Left ventricular ejection fraction 57% 4. Non invasive risk stratification*: Low *2012 Appropriate Use Criteria for Coronary Revascularization Focused Update: J Am Coll Cardiol. [id number]. http://content.airportbarriers.com.aspx?articleid=[email or web address] Electronically Signed   By: Jacqulynn Cadet M.D.   On: 07/11/2016 12:37   Dg Chest Portable 1 View  Result Date: 07/10/2016 CLINICAL DATA:  Chest pain and pressure.  Left arm pain. EXAM: PORTABLE CHEST 1 VIEW COMPARISON:  Chest CT 02/25/2016 FINDINGS: The cardiomediastinal contours are normal. The lungs are clear. Pulmonary vasculature is normal. No consolidation, pleural effusion, or pneumothorax. Cortical thickening of the right fourth and left ninth ribs on a CT not well visualized radiographically. There is degenerative change in the spine. IMPRESSION: No acute abnormality. Electronically Signed   By: Jeb Levering M.D.   On: 07/10/2016 02:29     I have independently reviewed the above radiologic studies.  Recent Lab Findings: Lab Results  Component Value Date   WBC 8.4 07/21/2016   HGB 13.4 07/21/2016   HCT 40.3 07/21/2016  PLT 351 07/21/2016   GLUCOSE 158 (H) 07/21/2016   CHOL 196 07/10/2016   TRIG 365 (H) 07/10/2016   HDL 24 (L) 07/10/2016   LDLCALC 99 07/10/2016   ALT 24 07/21/2016   AST 15 07/21/2016   NA 143 07/21/2016   K 3.9 07/21/2016   CL 108 07/21/2016   CREATININE  1.25 (H) 07/21/2016   BUN 18 07/21/2016   CO2 27 07/21/2016   INR 1.09 07/21/2016   HGBA1C 7.0 (H) 07/21/2016   CT of chest 02/2016: Final Report  CLINICAL DATA: Followup rib abnormality seen on recent bone scan.  EXAM: CT CHEST WITHOUT CONTRAST  TECHNIQUE: Multidetector CT imaging of the chest was performed following the standard protocol without IV contrast.  COMPARISON: Whole bone scan 02/19/2016  FINDINGS: Mediastinum/Nodes: No chest wall mass, supraclavicular or axillary lymphadenopathy.  The heart is normal in size. No pericardial effusion. 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 Scattered aortic calcifications.  Three-vessel coronary artery calcifications.  No mediastinal or hilar mass or adenopathy. Esophagus is grossly normal.  Lungs/Pleura: No acute pulmonary findings. No worrisome pulmonary lesions. No pleural effusion.  Upper abdomen: No significant upper abdominal findings.  Musculoskeletal: The abnormalities the on the bone scan correlate 2 areas of marked cortical thickening involving the fourth right posterior rib and also the ninth left posterior rib. This is not have the appearance of prostate cancer. This is most likely some type of hyperostotic process and could be related to remote trauma. Paget's disease would be another possibility. There are also diffuse degenerative changes involving the thoracic spine with osteophytosis and DISH.  Severe degenerate disc disease is barely covered at L2-3 but may account for the abnormality on the bone scan.  IMPRESSION: 1. The findings on the bone scan are un likely due to prostate cancer. This is more likely remote trauma or other hyperostotic process such as Paget's disease. 2. 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 3. No findings suspicious for metastatic prostate cancer. 4. Three vessel coronary artery  calcifications.   Electronically Signed By: Marijo Sanes M.D. On: 02/25/2016 17:27 Procedures   Left Heart Cath and Coronary Angiography  Conclusion     RPDA lesion, 70 %stenosed.  1st RPLB lesion, 80 %stenosed.  LM lesion, 30 %stenosed.  Prox Cx lesion, 30 %stenosed.  Ost 1st Mrg to 1st Mrg lesion, 90 %stenosed.  Dist LAD lesion, 70 %stenosed.  The left ventricular systolic function is normal.  LV end diastolic pressure is mildly elevated.  The left ventricular ejection fraction is 55-65% by visual estimate.  Mid RCA lesion, 50 %stenosed.  Mid LAD lesion, 80 %stenosed.  Prox LAD lesion, 50 %stenosed.   1. Moderate 3 vessel obstructive CAD.    - LAD with 50% ostial, 80% mid, and 70% distal disease.    - 90% very small OM1    - 50% mid RCA, 70% very small PDA, 80% PLOM 2. Good LV function  Plan: recommend aggressive medical therapy. Would continue ASA, amlodipine. Add beta blocker and statin. Patient has only had one episode of chest pain and a low risk Myoview. If he continues to have angina on medical therapy then we should consider him for revascularization with CABG. Will need close follow up as outpatient.      I have independently reviewed the above  cath films and reviewed the findings with the  patient . I have reviewed the films with Dr Burt Knack and Dr Darcey Nora whom  concurr CABG is best treatment option    Assessment / Plan:   Plan to proceed with coronary artery bypass grafting tomorrow as discussed with the patient during his recent hospitalization, risks and options have been discussed again with he and his wife in detail.   The goals risks and alternatives of the planned surgical procedure CABG  have been discussed with the patient in detail. The risks of the procedure including death, infection, stroke, myocardial infarction, bleeding, blood transfusion have all been discussed specifically.  I have quoted [name] a 2 % of perioperative  mortality and a complication rate as high as 20 %. The patient's questions have been answered.SUEDE MONIGOLD is willing  to proceed with the planned procedure.   Grace Isaac MD      [address] [place] [address] Office [phone]   Beeper [phone]  07/23/2016 7:15 AM

## 2016-07-23 NOTE — Anesthesia Procedure Notes (Signed)
Procedure Name: Intubation Date/Time: 07/23/2016 7:47 AM Performed by: Everlean Cherry A Pre-anesthesia Checklist: Patient identified, Emergency Drugs available, Suction available and Patient being monitored Patient Re-evaluated:Patient Re-evaluated prior to inductionOxygen Delivery Method: Circle system utilized Preoxygenation: Pre-oxygenation with 100% oxygen Intubation Type: IV induction Ventilation: Mask ventilation without difficulty and Oral airway inserted - appropriate to patient size Laryngoscope Size: Sabra Heck and 2 Grade View: Grade II Tube type: Oral Tube size: 8.0 mm Number of attempts: 1 Airway Equipment and Method: Stylet Placement Confirmation: ETT inserted through vocal cords under direct vision,  breath sounds checked- equal and bilateral and positive ETCO2 Secured at: 24 cm Tube secured with: Tape Dental Injury: Teeth and Oropharynx as per pre-operative assessment

## 2016-07-23 NOTE — Progress Notes (Signed)
Patient was extubated to nasal cannula. No distress noted. Vital signs stable.

## 2016-07-23 NOTE — Anesthesia Preprocedure Evaluation (Signed)
Anesthesia Evaluation  Patient identified by MRN, date of birth, ID band Patient awake    Reviewed: Allergy & Precautions, H&P , NPO status , Patient's Chart, lab work & pertinent test results  History of Anesthesia Complications Negative for: history of anesthetic complications  Airway Mallampati: II  TM Distance: >3 FB Neck ROM: full    Dental no notable dental hx.    Pulmonary neg pulmonary ROS,    Pulmonary exam normal breath sounds clear to auscultation       Cardiovascular hypertension, + angina + CAD  Normal cardiovascular exam+ Valvular Problems/Murmurs AI  Rhythm:regular Rate:Normal     Neuro/Psych negative neurological ROS     GI/Hepatic negative GI ROS, Neg liver ROS,   Endo/Other  diabetes  Renal/GU negative Renal ROS     Musculoskeletal   Abdominal   Peds  Hematology negative hematology ROS (+)   Anesthesia Other Findings   Reproductive/Obstetrics negative OB ROS                             Anesthesia Physical Anesthesia Plan  ASA: IV  Anesthesia Plan: General   Post-op Pain Management:    Induction: Intravenous  Airway Management Planned: Oral ETT  Additional Equipment: Arterial line, CVP, TEE, Ultrasound Guidance Line Placement and PA Cath  Intra-op Plan:   Post-operative Plan: Post-operative intubation/ventilation  Informed Consent: I have reviewed the patients History and Physical, chart, labs and discussed the procedure including the risks, benefits and alternatives for the proposed anesthesia with the patient or authorized representative who has indicated his/her understanding and acceptance.   Dental Advisory Given  Plan Discussed with: Anesthesiologist, CRNA and Surgeon  Anesthesia Plan Comments:         Anesthesia Quick Evaluation

## 2016-07-23 NOTE — Brief Op Note (Addendum)
07/23/2016      [address]       [place] [address]             [phone]     07/23/2016  11:35 AM  PATIENT:  Lance Stevens  70 y.o. male  PRE-OPERATIVE DIAGNOSIS:  CAD  POST-OPERATIVE DIAGNOSIS:  CAD  PROCEDURE:  Procedure(s): CORONARY ARTERY BYPASS GRAFTING (CABG), ON PUMP, TIMES SIX, USING LEFT INTERNAL MAMMARY ARTERY, RIGHT GREATER SAPHENOUS VEIN HARVESTED ENDOSCOPICALLY, LIMA to LAD, Vein to Dig, Vein sequentially to Intermediate and Circumflex , Vein  Sequentially to distal RCA and distal PL TRANSESOPHAGEAL ECHOCARDIOGRAM (TEE)  SURGEON:  Surgeon(s): Grace Isaac, MD   ASSISTANT: Sherian Maroon SA  ANESTHESIA:   general  PATIENT CONDITION:  ICU - intubated and hemodynamically stable.  PRE-OPERATIVE WEIGHT: [id number]  Complications: no known

## 2016-07-23 NOTE — Transfer of Care (Signed)
Immediate Anesthesia Transfer of Care Note  Patient: EIDAN DRUMMONDS  Procedure(s) Performed: Procedure(s) with comments: CORONARY ARTERY BYPASS GRAFTING (CABG), ON PUMP, TIMES SIX, USING LEFT INTERNAL MAMMARY ARTERY, RIGHT GREATER SAPHENOUS VEIN HARVESTED ENDOSCOPICALLY (N/A) - LIMA to LAD, SVG to DIAG, SEQ SVG to INTERMEDIATE and CIRC, SVG to DISTAL RIGHT, SVG to MID POSTERIOR LATERAL. TRANSESOPHAGEAL ECHOCARDIOGRAM (TEE) (N/A)  Patient Location: SICU  Anesthesia Type:General  Level of Consciousness: sedated and Patient remains intubated per anesthesia plan  Airway & Oxygen Therapy: Patient remains intubated per anesthesia plan and Patient placed on Ventilator (see vital sign flow sheet for setting)  Post-op Assessment: Report given to RN and Post -op Vital signs reviewed and stable  Post vital signs: Reviewed and stable  Last Vitals:  Vitals:   07/23/16 0617 07/23/16 1435  BP: 127/66 122/61  Pulse: (!) 59 89  Resp: 20 19  Temp: 36.8 C 36.7 C    Last Pain:  Vitals:   07/23/16 0617  TempSrc: Oral      Patients Stated Pain Goal: 5 (0000000 [id number])  Complications: No apparent anesthesia complications

## 2016-07-23 NOTE — OR Nursing (Signed)
Twenty minute call to SICU charge nurse at 1352. 

## 2016-07-23 NOTE — Progress Notes (Signed)
Echocardiogram Echocardiogram Transesophageal has been performed.  Aggie Cosier 07/23/2016, 8:11 AM

## 2016-07-23 NOTE — Anesthesia Procedure Notes (Signed)
Anesthesia Procedure Note Procedures: Right IJ Gordy Councilman Catheter Insertion: [id number]: The patient was identified and consent obtained.  TO was performed, and full barrier precautions were used.  The skin was anesthetized with lidocaine-4cc plain with 25g needle.  Once the vein was located with the 22 ga. needle using ultrasound guidance , the wire was inserted into the vein.  The wire location was confirmed with ultrasound.  The tissue was dilated and the 8.5 [place] cordis catheter was carefully inserted. Afterwards Gordy Councilman catheter was inserted. PA catheter at 48cm.  The patient tolerated the procedure well.  CE

## 2016-07-24 ENCOUNTER — Inpatient Hospital Stay (HOSPITAL_COMMUNITY): Payer: Medicare Other

## 2016-07-24 LAB — GLUCOSE, CAPILLARY
Glucose-Capillary: 112 mg/dL — ABNORMAL HIGH (ref 65–99)
Glucose-Capillary: 113 mg/dL — ABNORMAL HIGH (ref 65–99)
Glucose-Capillary: 113 mg/dL — ABNORMAL HIGH (ref 65–99)
Glucose-Capillary: 116 mg/dL — ABNORMAL HIGH (ref 65–99)
Glucose-Capillary: 116 mg/dL — ABNORMAL HIGH (ref 65–99)
Glucose-Capillary: 116 mg/dL — ABNORMAL HIGH (ref 65–99)
Glucose-Capillary: 118 mg/dL — ABNORMAL HIGH (ref 65–99)
Glucose-Capillary: 119 mg/dL — ABNORMAL HIGH (ref 65–99)
Glucose-Capillary: 122 mg/dL — ABNORMAL HIGH (ref 65–99)
Glucose-Capillary: 128 mg/dL — ABNORMAL HIGH (ref 65–99)
Glucose-Capillary: 144 mg/dL — ABNORMAL HIGH (ref 65–99)
Glucose-Capillary: 147 mg/dL — ABNORMAL HIGH (ref 65–99)
Glucose-Capillary: 164 mg/dL — ABNORMAL HIGH (ref 65–99)
Glucose-Capillary: 192 mg/dL — ABNORMAL HIGH (ref 65–99)
Glucose-Capillary: 208 mg/dL — ABNORMAL HIGH (ref 65–99)
Glucose-Capillary: 78 mg/dL (ref 65–99)
Glucose-Capillary: 89 mg/dL (ref 65–99)

## 2016-07-24 LAB — CBC
HCT: 30.6 % — ABNORMAL LOW (ref 39.0–52.0)
HEMATOCRIT: 30.1 % — AB (ref 39.0–52.0)
HEMOGLOBIN: 10 g/dL — AB (ref 13.0–17.0)
HEMOGLOBIN: 10 g/dL — AB (ref 13.0–17.0)
MCH: 30.6 pg (ref 26.0–34.0)
MCH: 30.3 pg (ref 26.0–34.0)
MCHC: 33.2 g/dL (ref 30.0–36.0)
MCHC: 32.7 g/dL (ref 30.0–36.0)
MCV: 92 fL (ref 78.0–100.0)
MCV: 92.7 fL (ref 78.0–100.0)
Platelets: 219 10*3/uL (ref 150–400)
Platelets: 215 10*3/uL (ref 150–400)
RBC: 3.27 MIL/uL — ABNORMAL LOW (ref 4.22–5.81)
RBC: 3.3 MIL/uL — AB (ref 4.22–5.81)
RDW: 12.4 % (ref 11.5–15.5)
RDW: 12.5 % (ref 11.5–15.5)
WBC: 14.6 10*3/uL — AB (ref 4.0–10.5)
WBC: 15.2 10*3/uL — ABNORMAL HIGH (ref 4.0–10.5)

## 2016-07-24 LAB — POCT I-STAT, CHEM 8
BUN: 10 mg/dL (ref 6–20)
Calcium, Ion: 1.12 mmol/L (ref 1.12–1.23)
Chloride: 100 mmol/L — ABNORMAL LOW (ref 101–111)
Creatinine, Ser: 1 mg/dL (ref 0.61–1.24)
Glucose, Bld: 153 mg/dL — ABNORMAL HIGH (ref 65–99)
HCT: 31 % — ABNORMAL LOW (ref 39.0–52.0)
Hemoglobin: 10.5 g/dL — ABNORMAL LOW (ref 13.0–17.0)
Potassium: 4.3 mmol/L (ref 3.5–5.1)
Sodium: 138 mmol/L (ref 135–145)
TCO2: 26 mmol/L (ref 0–100)

## 2016-07-24 LAB — BASIC METABOLIC PANEL
ANION GAP: 5 (ref 5–15)
BUN: 10 mg/dL (ref 6–20)
CALCIUM: 7.8 mg/dL — AB (ref 8.9–10.3)
CHLORIDE: 106 mmol/L (ref 101–111)
CO2: 24 mmol/L (ref 22–32)
Creatinine, Ser: 0.92 mg/dL (ref 0.61–1.24)
GFR calc non Af Amer: >60 mL/min (ref >60)
Glucose, Bld: 101 mg/dL — ABNORMAL HIGH (ref 65–99)
Potassium: 3.9 mmol/L (ref 3.5–5.1)
SODIUM: 135 mmol/L (ref 135–145)

## 2016-07-24 LAB — CREATININE, SERUM
CREATININE: 1.19 mg/dL (ref 0.61–1.24)
GFR calc Af Amer: >60 mL/min (ref >60)
GFR calc non Af Amer: >60 mL/min (ref >60)

## 2016-07-24 LAB — MAGNESIUM
MAGNESIUM: 2.4 mg/dL (ref 1.7–2.4)
Magnesium: 2.1 mg/dL (ref 1.7–2.4)

## 2016-07-24 MED ORDER — POTASSIUM CHLORIDE 10 MEQ/50ML IV SOLN
10.0000 meq | INTRAVENOUS | Status: AC
  Administered 2016-07-24 (×3): 10 meq via INTRAVENOUS
  Filled 2016-07-24: qty 50

## 2016-07-24 MED ORDER — INSULIN DETEMIR 100 UNIT/ML ~~LOC~~ SOLN
20.0000 [IU] | Freq: Two times a day (BID) | SUBCUTANEOUS | Status: DC
  Administered 2016-07-24 – 2016-07-25 (×2): 20 [IU] via SUBCUTANEOUS
  Filled 2016-07-24 (×5): qty 0.2

## 2016-07-24 MED ORDER — INSULIN DETEMIR 100 UNIT/ML ~~LOC~~ SOLN: 20.0000 [IU] | Freq: Every day | SUBCUTANEOUS | Status: DC

## 2016-07-24 MED ORDER — INSULIN ASPART 100 UNIT/ML ~~LOC~~ SOLN
0.0000 [IU] | SUBCUTANEOUS | Status: DC
  Administered 2016-07-24: 4 [IU] via SUBCUTANEOUS
  Administered 2016-07-24 – 2016-07-25 (×4): 2 [IU] via SUBCUTANEOUS

## 2016-07-24 MED ORDER — FUROSEMIDE 10 MG/ML IJ SOLN
40.0000 mg | Freq: Once | INTRAMUSCULAR | Status: AC
  Administered 2016-07-24: 40 mg via INTRAVENOUS
  Filled 2016-07-24: qty 4

## 2016-07-24 MED ORDER — INSULIN DETEMIR 100 UNIT/ML ~~LOC~~ SOLN: 20.0000 [IU] | Freq: Two times a day (BID) | SUBCUTANEOUS | Status: DC

## 2016-07-24 MED ORDER — INSULIN DETEMIR 100 UNIT/ML ~~LOC~~ SOLN
20.0000 [IU] | Freq: Every day | SUBCUTANEOUS | Status: DC
  Administered 2016-07-24: 20 [IU] via SUBCUTANEOUS
  Filled 2016-07-24: qty 0.2

## 2016-07-24 MED ORDER — ENOXAPARIN SODIUM 40 MG/0.4ML ~~LOC~~ SOLN
40.0000 mg | Freq: Every day | SUBCUTANEOUS | Status: DC
  Administered 2016-07-24 – 2016-08-01 (×9): 40 mg via SUBCUTANEOUS
  Filled 2016-07-24 (×9): qty 0.4

## 2016-07-24 MED ORDER — AMLODIPINE BESYLATE 5 MG PO TABS
5.0000 mg | ORAL_TABLET | Freq: Every day | ORAL | Status: DC
  Administered 2016-07-24 – 2016-07-25 (×2): 5 mg via ORAL
  Filled 2016-07-24 (×2): qty 1

## 2016-07-24 NOTE — Anesthesia Postprocedure Evaluation (Signed)
Anesthesia Post Note  Patient: Lance Stevens  Procedure(s) Performed: Procedure(s) (LRB): CORONARY ARTERY BYPASS GRAFTING (CABG), ON PUMP, TIMES SIX, USING LEFT INTERNAL MAMMARY ARTERY, RIGHT GREATER SAPHENOUS VEIN HARVESTED ENDOSCOPICALLY (N/A) TRANSESOPHAGEAL ECHOCARDIOGRAM (TEE) (N/A)  Patient location during evaluation: SICU Anesthesia Type: General Level of consciousness: sedated Pain management: pain level controlled Vital Signs Assessment: post-procedure vital signs reviewed and stable Respiratory status: patient remains intubated per anesthesia plan Cardiovascular status: stable Anesthetic complications: no     Last Vitals:  Vitals:   07/24/16 0600 07/24/16 0700  BP: 118/73   Pulse: 73 70  Resp: (!) 23 12  Temp: 37.3 C 37.2 C    Last Pain:  Vitals:   07/24/16 0600  TempSrc:   PainSc: 6    Pain Goal: Patients Stated Pain Goal: 2 (07/24/16 0600)               Zenaida Deed

## 2016-07-24 NOTE — Progress Notes (Signed)
      [address]       [place] [address]             [phone]      No complaints  BP 124/68   Pulse 77   Temp 98.3 F (36.8 C) (Oral)   Resp 18   Ht 5\' 9"  (1.753 m)   Wt 187 lb 9.8 oz (85.1 kg)   SpO2 98%   BMI 27.71 kg/m    Intake/Output Summary (Last 24 hours) at 07/24/16 1742 Last data filed at 07/24/16 1400  Gross per 24 hour  Intake          1872.07 ml  Output             4420 ml  Net         -2547.93 ml   CBG elevated this AM, better this morning  Cr= 1.0, Hct= 31  Doing well POD # 1  Levon Boettcher C. Roxan Hockey, MD [facility] [phone]

## 2016-07-24 NOTE — Progress Notes (Signed)
1 Day Post-Op Procedure(s) (LRB): CORONARY ARTERY BYPASS GRAFTING (CABG), ON PUMP, TIMES SIX, USING LEFT INTERNAL MAMMARY ARTERY, RIGHT GREATER SAPHENOUS VEIN HARVESTED ENDOSCOPICALLY (N/A) TRANSESOPHAGEAL ECHOCARDIOGRAM (TEE) (N/A) Subjective: Some incisional pain, but "I feel pretty good"  Objective: Vital signs in last 24 hours: Temp:  [97 F (36.1 C)-99.1 F (37.3 C)] 99 F (37.2 C) (08/19 0700) Pulse Rate:  [70-90] 70 (08/19 0700) Cardiac Rhythm: Atrial paced (08/19 0400) Resp:  [12-23] 12 (08/19 0700) BP: (82-122)/(55-81) 112/75 (08/19 0700) SpO2:  [96 %-100 %] 100 % (08/19 0700) Arterial Line BP: (99-163)/(50-66) 163/66 (08/19 0600) FiO2 (%):  [40 %-50 %] 40 % (08/18 1900) Weight:  [187 lb 9.8 oz (85.1 kg)] 187 lb 9.8 oz (85.1 kg) (08/19 0500)  Hemodynamic parameters for last 24 hours: PAP: (15-33)/(7-19) 33/18 CO:  [4 L/min-6.1 L/min] 4.2 L/min CI:  [2 L/min/m2-2.9 L/min/m2] 2 L/min/m2  Intake/Output from previous day: 08/18 0701 - 08/19 0700 In: 4911.6 [I.V.:3118.6; Blood:393; IV Piggyback:1400] Out: 4840 [Urine:3445; Emesis/NG output:15; Blood:1000; Chest Tube:380] Intake/Output this shift: No intake/output data recorded.  General appearance: alert, cooperative and no distress Neurologic: intact Heart: regular rate and rhythm Lungs: diminished breath sounds bibasilar Abdomen: normal findings: soft, non-tender  Lab Results:  Recent Labs  07/23/16 2118 07/24/16 0305  WBC 16.8* 14.6*  HGB 9.7* 10.0*  HCT 28.7* 30.1*  PLT 200 219   BMET:  Recent Labs  07/21/16 1618  07/23/16 2108 07/23/16 2118 07/24/16 0305  NA 143  < > 138  --  135  K 3.9  < > 3.9  --  3.9  CL 108  < > 103  --  106  CO2 27  --   --   --  24  GLUCOSE 158*  < > 114*  --  101*  BUN 18  < > 11  --  10  CREATININE 1.25*  < > 0.80 0.89 0.92  CALCIUM 9.6  --   --   --  7.8*  < > = values in this interval not displayed.  PT/INR:  Recent Labs  07/23/16 1430  LABPROT 17.7*  INR 1.44    ABG    Component Value Date/Time   PHART 7.403 07/23/2016 2006   HCO3 24.9 (H) 07/23/2016 2006   TCO2 25 07/23/2016 2108   ACIDBASEDEF 1.0 07/23/2016 1436   O2SAT 99.0 07/23/2016 2006   CBG (last 3)   Recent Labs  07/24/16 0504 07/24/16 0610 07/24/16 0653  GLUCAP 116* 128* 113*    Assessment/Plan: S/P Procedure(s) (LRB): CORONARY ARTERY BYPASS GRAFTING (CABG), ON PUMP, TIMES SIX, USING LEFT INTERNAL MAMMARY ARTERY, RIGHT GREATER SAPHENOUS VEIN HARVESTED ENDOSCOPICALLY (N/A) TRANSESOPHAGEAL ECHOCARDIOGRAM (TEE) (N/A) -CV- s/p CABG. Good output- dc swan  ASA, statin, beta blocker  RESP- IS for bibasilar atelectasis (mild)  RENAL- creatinine OK, lytes OK  ENDO- CBG well controlled, convert to q4 SSI  Anemia secondary to ABL- mild, follow  SCD + enoxaparin for DVT prophylaxis  mobilize   LOS: 1 day    Melrose Nakayama 07/24/2016

## 2016-07-25 ENCOUNTER — Inpatient Hospital Stay (HOSPITAL_COMMUNITY): Payer: Medicare Other

## 2016-07-25 LAB — CBC
HCT: 33.5 % — ABNORMAL LOW (ref 39.0–52.0)
HEMOGLOBIN: 10.9 g/dL — AB (ref 13.0–17.0)
MCH: 30.4 pg (ref 26.0–34.0)
MCHC: 32.5 g/dL (ref 30.0–36.0)
MCV: 93.6 fL (ref 78.0–100.0)
Platelets: 286 10*3/uL (ref 150–400)
RBC: 3.58 MIL/uL — ABNORMAL LOW (ref 4.22–5.81)
RDW: 12.6 % (ref 11.5–15.5)
WBC: 19.3 10*3/uL — ABNORMAL HIGH (ref 4.0–10.5)

## 2016-07-25 LAB — GLUCOSE, CAPILLARY
Glucose-Capillary: 136 mg/dL — ABNORMAL HIGH (ref 65–99)
Glucose-Capillary: 123 mg/dL — ABNORMAL HIGH (ref 65–99)
Glucose-Capillary: 144 mg/dL — ABNORMAL HIGH (ref 65–99)
Glucose-Capillary: 168 mg/dL — ABNORMAL HIGH (ref 65–99)
Glucose-Capillary: 115 mg/dL — ABNORMAL HIGH (ref 65–99)

## 2016-07-25 LAB — BASIC METABOLIC PANEL
ANION GAP: 7 (ref 5–15)
BUN: 11 mg/dL (ref 6–20)
CALCIUM: 8.9 mg/dL (ref 8.9–10.3)
CO2: 28 mmol/L (ref 22–32)
Chloride: 103 mmol/L (ref 101–111)
Creatinine, Ser: 1.18 mg/dL (ref 0.61–1.24)
GFR calc Af Amer: >60 mL/min (ref >60)
GLUCOSE: 147 mg/dL — AB (ref 65–99)
Potassium: 4 mmol/L (ref 3.5–5.1)
SODIUM: 138 mmol/L (ref 135–145)

## 2016-07-25 MED ORDER — AMIODARONE IV BOLUS ONLY 150 MG/100ML: 150.0000 mg | Freq: Once | INTRAVENOUS | Status: DC

## 2016-07-25 MED ORDER — AMIODARONE HCL IN DEXTROSE 360-4.14 MG/200ML-% IV SOLN
30.0000 mg/h | INTRAVENOUS | Status: DC
  Administered 2016-07-26 (×2): 30 mg/h via INTRAVENOUS
  Filled 2016-07-25 (×4): qty 200

## 2016-07-25 MED ORDER — MAGNESIUM HYDROXIDE 400 MG/5ML PO SUSP
30.0000 mL | Freq: Every day | ORAL | Status: DC | PRN
Start: 2016-07-25 — End: 2016-08-02

## 2016-07-25 MED ORDER — ZOLPIDEM TARTRATE 5 MG PO TABS
5.0000 mg | ORAL_TABLET | Freq: Every evening | ORAL | Status: DC | PRN
  Administered 2016-07-28 – 2016-08-01 (×5): 5 mg via ORAL
  Filled 2016-07-25 (×5): qty 1

## 2016-07-25 MED ORDER — FUROSEMIDE 40 MG PO TABS
40.0000 mg | ORAL_TABLET | Freq: Every day | ORAL | Status: DC
  Administered 2016-07-25 – 2016-07-30 (×6): 40 mg via ORAL
  Filled 2016-07-25 (×6): qty 1

## 2016-07-25 MED ORDER — ALUM & MAG HYDROXIDE-SIMETH 200-200-20 MG/5ML PO SUSP
15.0000 mL | ORAL | Status: DC | PRN
Start: 2016-07-25 — End: 2016-08-02

## 2016-07-25 MED ORDER — SODIUM CHLORIDE 0.9% FLUSH: 3.0000 mL | INTRAVENOUS | Status: DC | PRN

## 2016-07-25 MED ORDER — MOVING RIGHT ALONG BOOK
Freq: Once | Status: AC
  Administered 2016-07-25: 13:00:00
  Filled 2016-07-25: qty 1

## 2016-07-25 MED ORDER — INSULIN ASPART 100 UNIT/ML ~~LOC~~ SOLN: 0.0000 [IU] | Freq: Every day | SUBCUTANEOUS | Status: DC

## 2016-07-25 MED ORDER — AMIODARONE HCL IN DEXTROSE 360-4.14 MG/200ML-% IV SOLN
INTRAVENOUS | Status: AC
  Filled 2016-07-25: qty 200

## 2016-07-25 MED ORDER — POTASSIUM CHLORIDE ER 10 MEQ PO TBCR
20.0000 meq | EXTENDED_RELEASE_TABLET | Freq: Every day | ORAL | Status: DC
  Administered 2016-07-25 – 2016-07-30 (×6): 20 meq via ORAL
  Filled 2016-07-25 (×11): qty 2

## 2016-07-25 MED ORDER — INSULIN ASPART 100 UNIT/ML ~~LOC~~ SOLN
0.0000 [IU] | Freq: Three times a day (TID) | SUBCUTANEOUS | Status: DC
  Administered 2016-07-25: 2 [IU] via SUBCUTANEOUS
  Administered 2016-07-25: 3 [IU] via SUBCUTANEOUS
  Administered 2016-07-25: 2 [IU] via SUBCUTANEOUS
  Administered 2016-07-26: 3 [IU] via SUBCUTANEOUS
  Administered 2016-07-26 – 2016-07-27 (×3): 2 [IU] via SUBCUTANEOUS

## 2016-07-25 MED ORDER — AMIODARONE LOAD VIA INFUSION
150.0000 mg | Freq: Once | INTRAVENOUS | Status: AC
  Administered 2016-07-25: 150 mg via INTRAVENOUS

## 2016-07-25 MED ORDER — METOPROLOL TARTRATE 5 MG/5ML IV SOLN
2.5000 mg | Freq: Once | INTRAVENOUS | Status: AC
  Administered 2016-07-25: 2.5 mg via INTRAVENOUS

## 2016-07-25 MED ORDER — AMIODARONE HCL IN DEXTROSE 360-4.14 MG/200ML-% IV SOLN
60.0000 mg/h | INTRAVENOUS | Status: AC
  Administered 2016-07-25 (×2): 60 mg/h via INTRAVENOUS
  Filled 2016-07-25: qty 200

## 2016-07-25 MED ORDER — AMIODARONE LOAD VIA INFUSION
150.0000 mg | Freq: Once | INTRAVENOUS | Status: AC
Start: 2016-07-25 — End: 2016-07-25
  Administered 2016-07-25: 150 mg via INTRAVENOUS
  Filled 2016-07-25: qty 83.34

## 2016-07-25 NOTE — Progress Notes (Addendum)
2 Days Post-Op Procedure(s) (LRB): CORONARY ARTERY BYPASS GRAFTING (CABG), ON PUMP, TIMES SIX, USING LEFT INTERNAL MAMMARY ARTERY, RIGHT GREATER SAPHENOUS VEIN HARVESTED ENDOSCOPICALLY (N/A) TRANSESOPHAGEAL ECHOCARDIOGRAM (TEE) (N/A) Subjective: C/o lack of sleep. Denies pain and nausea  Objective: Vital signs in last 24 hours: Temp:  [98 F (36.7 C)-99 F (37.2 C)] 98 F (36.7 C) (08/20 0808) Pulse Rate:  [68-83] 77 (08/20 0600) Cardiac Rhythm: Normal sinus rhythm (08/19 2000) Resp:  [15-30] 21 (08/20 0600) BP: (105-165)/(48-88) 130/60 (08/20 0600) SpO2:  [91 %-100 %] 94 % (08/20 0600) Weight:  [207 lb 10.8 oz (94.2 kg)] 207 lb 10.8 oz (94.2 kg) (08/20 0417)  Hemodynamic parameters for last 24 hours:    Intake/Output from previous day: 08/19 0701 - 08/20 0700 In: 1405 [P.O.:680; I.V.:525; IV Piggyback:200] Out: 4770 [Urine:4670; Emesis/NG output:100] Intake/Output this shift: No intake/output data recorded.  General appearance: alert, cooperative and no distress Neurologic: intact Heart: regular rate and rhythm Lungs: diminished breath sounds bibasilar and L>R Abdomen: normal findings: soft, non-tender  Lab Results:  Recent Labs  07/24/16 1635 07/24/16 1637 07/25/16 0410  WBC 15.2*  --  19.3*  HGB 10.0* 10.5* 10.9*  HCT 30.6* 31.0* 33.5*  PLT 215  --  286   BMET:  Recent Labs  07/24/16 0305  07/24/16 1637 07/25/16 0410  NA 135  --  138 138  K 3.9  --  4.3 4.0  CL 106  --  100* 103  CO2 24  --   --  28  GLUCOSE 101*  --  153* 147*  BUN 10  --  10 11  CREATININE 0.92  < > 1.00 1.18  CALCIUM 7.8*  --   --  8.9  < > = values in this interval not displayed.  PT/INR:  Recent Labs  07/23/16 1430  LABPROT 17.7*  INR 1.44   ABG    Component Value Date/Time   PHART 7.403 07/23/2016 2006   HCO3 24.9 (H) 07/23/2016 2006   TCO2 26 07/24/2016 1637   ACIDBASEDEF 1.0 07/23/2016 1436   O2SAT 99.0 07/23/2016 2006   CBG (last 3)   Recent Labs   07/24/16 2336 07/25/16 0350 07/25/16 0802  GLUCAP 122* 136* 123*    Assessment/Plan: S/P Procedure(s) (LRB): CORONARY ARTERY BYPASS GRAFTING (CABG), ON PUMP, TIMES SIX, USING LEFT INTERNAL MAMMARY ARTERY, RIGHT GREATER SAPHENOUS VEIN HARVESTED ENDOSCOPICALLY (N/A) TRANSESOPHAGEAL ECHOCARDIOGRAM (TEE) (N/A) -POD # 2  CV- in SR, BP OK  RESP- left lower lobe atelectasis on CXR- continue IS  RENAL- creatinine and lytes OK, diurese  ENDO- CBG better controlled, change to AC/HS  LEUKOCYTOSIS- WBC up to 19K this AM, no fevers, follow  Dc central line and Foley  Continue ambulation  Transfer to 2 west   LOS: 2 days    Melrose Nakayama 07/25/2016  Went into atrial fib with RVR- will start amiodarone bolus and drip  Revonda Standard. Roxan Hockey, MD [facility] [phone]

## 2016-07-25 NOTE — Consult Note (Signed)
Pharmacy has been consulted to review drug-drug interactions with amiodarone  All current medications have been review. Minor DDIs noted: 1. Coadministration with metoprolol tartrate may result in severe bradycardia, hypotension and cardiac arrest.   -Current dose of metoprolol 12.5 mg BID should be ok. Monitor HR. 2. Plasma concentrations and pharmacologic effects of atorvastatin may be increased by amiodarone.  -Current dose of atorvastatin 40 mg daily should be ok. Monitor for myalgias.   Thank you for this consult. Pharmacy will sign off. Please re-consult if needed.  Gwenlyn Perking, PharmD PGY1 Pharmacy Resident Pager: [phone] 07/25/2016 9:57 AM

## 2016-07-25 NOTE — Progress Notes (Signed)
Pt went into rapid Afib in 170s-180s while laying in bed. MD on unit made aware. Order given to start amio protocol and to keep central line in at this time to infuse amio. Will cont to monitor and assess

## 2016-07-26 ENCOUNTER — Encounter (HOSPITAL_COMMUNITY): Payer: Self-pay | Admitting: Cardiothoracic Surgery

## 2016-07-26 DIAGNOSIS — I4891 Unspecified atrial fibrillation: Secondary | ICD-10-CM

## 2016-07-26 LAB — GLUCOSE, CAPILLARY
Glucose-Capillary: 121 mg/dL — ABNORMAL HIGH (ref 65–99)
Glucose-Capillary: 147 mg/dL — ABNORMAL HIGH (ref 65–99)
Glucose-Capillary: 166 mg/dL — ABNORMAL HIGH (ref 65–99)
Glucose-Capillary: 141 mg/dL — ABNORMAL HIGH (ref 65–99)

## 2016-07-26 LAB — BASIC METABOLIC PANEL
Anion gap: 8 (ref 5–15)
BUN: 14 mg/dL (ref 6–20)
CHLORIDE: 101 mmol/L (ref 101–111)
CO2: 30 mmol/L (ref 22–32)
CREATININE: 1.23 mg/dL (ref 0.61–1.24)
Calcium: 8.7 mg/dL — ABNORMAL LOW (ref 8.9–10.3)
GFR calc Af Amer: >60 mL/min (ref >60)
GFR calc non Af Amer: 58 mL/min — ABNORMAL LOW (ref >60)
GLUCOSE: 120 mg/dL — AB (ref 65–99)
Potassium: 4.3 mmol/L (ref 3.5–5.1)
SODIUM: 139 mmol/L (ref 135–145)

## 2016-07-26 LAB — CBC
HCT: 31.4 % — ABNORMAL LOW (ref 39.0–52.0)
Hemoglobin: 10.2 g/dL — ABNORMAL LOW (ref 13.0–17.0)
MCH: 30.5 pg (ref 26.0–34.0)
MCHC: 32.5 g/dL (ref 30.0–36.0)
MCV: 94 fL (ref 78.0–100.0)
PLATELETS: 271 10*3/uL (ref 150–400)
RBC: 3.34 MIL/uL — ABNORMAL LOW (ref 4.22–5.81)
RDW: 12.8 % (ref 11.5–15.5)
WBC: 15.2 10*3/uL — ABNORMAL HIGH (ref 4.0–10.5)

## 2016-07-26 MED ORDER — METOPROLOL TARTRATE 25 MG PO TABS
25.0000 mg | ORAL_TABLET | Freq: Two times a day (BID) | ORAL | Status: DC
  Administered 2016-07-26 – 2016-08-02 (×15): 25 mg via ORAL
  Filled 2016-07-26 (×15): qty 1

## 2016-07-26 MED ORDER — AMIODARONE LOAD VIA INFUSION
150.0000 mg | Freq: Once | INTRAVENOUS | Status: AC
  Administered 2016-07-26: 150 mg via INTRAVENOUS
  Filled 2016-07-26: qty 83.34

## 2016-07-26 MED ORDER — ATORVASTATIN CALCIUM 80 MG PO TABS
80.0000 mg | ORAL_TABLET | Freq: Every day | ORAL | Status: DC
  Administered 2016-07-26 – 2016-08-02 (×7): 80 mg via ORAL
  Filled 2016-07-26 (×8): qty 1

## 2016-07-26 MED ORDER — AMIODARONE IV BOLUS ONLY 150 MG/100ML
150.0000 mg | Freq: Once | INTRAVENOUS | Status: DC
  Filled 2016-07-26: qty 100

## 2016-07-26 MED ORDER — METFORMIN HCL 500 MG PO TABS
500.0000 mg | ORAL_TABLET | Freq: Two times a day (BID) | ORAL | Status: DC
  Administered 2016-07-26 – 2016-08-02 (×15): 500 mg via ORAL
  Filled 2016-07-26 (×15): qty 1

## 2016-07-26 MED ORDER — AMIODARONE HCL 200 MG PO TABS
400.0000 mg | ORAL_TABLET | Freq: Two times a day (BID) | ORAL | Status: DC
  Administered 2016-07-26 (×2): 400 mg via ORAL
  Filled 2016-07-26 (×2): qty 2

## 2016-07-26 MED ORDER — METOPROLOL TARTRATE 25 MG/10 ML ORAL SUSPENSION
25.0000 mg | Freq: Two times a day (BID) | ORAL | Status: DC
  Filled 2016-07-26: qty 10

## 2016-07-26 MED ORDER — METOPROLOL TARTRATE 5 MG/5ML IV SOLN
2.5000 mg | Freq: Once | INTRAVENOUS | Status: AC
  Administered 2016-07-26: 2.5 mg via INTRAVENOUS
  Filled 2016-07-26: qty 5

## 2016-07-26 NOTE — Progress Notes (Signed)
CARDIAC REHAB PHASE I   PRE:  Rate/Rhythm: 65 SR  BP:  Supine: 118/70  Sitting:   Standing:    SaO2: 97%RA  MODE:  Ambulation: 40 ft   POST:  Rate/Rhythm: 80 SR  BP:  Supine:   Sitting: 122/72  Standing:    SaO2: 99%RA 1300-1342 Assisted pt to bathroom and then short walk in hall. Remained in NSR. Pt still a little nauseated so walk cut short.  40 ft on RA with rolling walker and asst x 1. Back to bed. Discussed sternal precautions. Discussed CRP 2 and will refer to [place].Marland Kitchen Brochure given to wife.    Graylon Good, RN BSN  07/26/2016 1:39 PM

## 2016-07-26 NOTE — Progress Notes (Signed)
On ambulation to the bathroom, patient c/o of dizziness and SOB, patient requested to sit in the chair for a while, HR increased to 140's other vital signs were stable, patient voiced a relief after a while and was helped back to the bed, patient now resting quietly in bed call light within reach, will continue to monitor

## 2016-07-26 NOTE — Op Note (Signed)
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  SURGICAL PROCEDURE:  Coronary artery bypass grafting x6 with the left internal mammary to the left anterior descending coronary artery, reversed saphenous vein graft to the diagonal coronary artery, sequential reversed saphenous vein graft to the intermediate and circumflex coronary artery, sequential reversed saphenous vein graft to the distal right coronary artery and distal posterior lateral branch of the right coronary artery, with greater saphenous vein harvesting endoscopically, right thigh and calf.  SURGEON:  Lanelle Bal, MD.  FIRST ASSISTANT:  Valentina Gu, surgical assistant.  BRIEF HISTORY:  The patient is a 70 year old male with no previous history of coronary artery disease, who in early August of this year presented with chest pain.  The patient underwent cardiac catheterization by Dr. Peter Martinique which demonstrated a three-vessel coronary artery disease with some element of distal disease noted.  At the time of catheterization, he had a 70-80% complex lesion in the LAD with a takeoff of a good-sized diagonal branch.  In addition, there was significant disease in intermediate vessel and the disease distal circumflex, posterior.  In addition, the mid main body of the right coronary artery had 60-70% lesion and additional disease in the mid posterolateral branch.  Overall, ventricular function was preserved. The patient did have a mildly dilated aorta at 3.9-4 cm by CT scan.   He had trace-to-mild aortic insufficiency.  Initially, the patient was treated medically and was discharged home.  He within several days had recurrent angina symptoms at rest and was readmitted through the emergency room.  Cardiac Surgery consultation was requested.  The patient was seen and films reviewed by several cardiologists and assured the patient that coronary artery bypass grafting was indicated with his diffuse disease and recurrent anginal symptoms.  Risks and options were discussed with the patient in detail and he agreed and signed informed consent.  DESCRIPTION OF PROCEDURE:  With Swan-Ganz and arterial line monitors in place, the patient underwent general anesthesia without incident.  A single-lumen endotracheal tube was placed.  A TEE probe was placed by Dr. Corene Cornea.  The chest, abdomen, and legs were prepped with Betadine and draped in usual sterile manner.  Appropriate time-out was performed.  We then proceeded with endoscopic harvesting of the right greater saphenous vein from the calf and thigh.  The vein was of good quality and caliber. Median sternotomy was performed.  Left internal mammary artery was dissected down as a pedicle graft.  The distal artery was divided, had good free flow.  Pericardium was opened.  Overall, ventricular function appeared preserved.  The patient did have trace-to-mild aortic insufficiency as noted.  He was systemically heparinized.  Ascending aorta was cannulated.  The right atrium was cannulated.  An aortic root vent cardioplegia needle  was introduced into the ascending aorta.  It was noted on abdominal CT scan, the patient's aorta was mildly dilated but not over 4 cm.  The patient was placed on cardiopulmonary bypass at 2.4 L/min/m2.  Sites of anastomosis were selected and dissected out of the epicardium.  The patient's body temperature is cooled to 32 degrees. Aortic crossclamp was applied.  500 mL of cold blood potassium cardioplegia  was administered with diastolic arrest of the heart.  We turned our attention first to the distal right coronary artery which was a large vessel, just at the takeoff of the posterior descending.  The vessel was opened and using a running side-to-side anastomosis with segment of reversed saphenous vein graft was performed.  The distal extent of the same vein was then carried to the mid posterolateral branch of the right coronary artery which was opened.  This vessel had some distal disease.  1 mm probe passed distally.  Using a running 7-0 Prolene, distal anastomosis was performed.  We then turned our attention to the lateral wall of the heart.  The intermediate vessel was opened. This vessel was partially intramyocardial.  A 1.5 mm probe did pass distally.  Using a diamond type side-to-side anastomosis with a running 8-0 Prolene, segment of reversed saphenous vein graft anastomosed to the intermediate and the distal extent of the same vein was then anastomosed to the circumflex branch.  This vessel was relatively small and thin walled, but did admit a 1 mm probe distally.  Additional cold blood cardioplegia was administered down the vein grafts.  Attention was then turned to the diagonal coronary artery which was opened and admitted a 1.5 mm probe distally.  Using a running 7-0 Prolene, distal anastomosis was performed with a segment of reversed saphenous vein graft. Attention was then turned to the left anterior descending coronary artery.  The proximal 2/3rd of this vessel was intramyocardial.  The distal third vessels were opened.  The LAD was relatively small, but 1 probe passed distally.  1/2 probe passed proximally.  Using a running 8- 0 Prolene, left internal mammary artery was anastomosed to the left anterior descending artery.  With the crossclamp still in place, 3 punch aortotomies were performed and the 3 vein grafts anastomosed to the ascending aorta.  The bulldog was removed  from the mammary artery with prompt rise in myocardial septal temperature.  The heart was allowed to passively fill and de-air and the proximal anastomoses were completed. The crossclamp was removed with total crossclamp time of 131 minutes. The patient spontaneously converted to a sinus rhythm.  He required atrially pacing to increased his rate.  Sites of anastomosis were inspected and free of bleeding.  With body temperature rewarmed to 37 degrees, he was then ventilated and weaned from cardiopulmonary bypass without difficulty and without pressors.  He remained hemodynamically stable.  He was decannulated in usual fashion.  Protamine sulfate was administered with operative field hemostatic.  Atrial and ventricular pacing wires had been applied.  Graft marker was applied.  Left pleural tube and Blake mediastinal drain were left in place.  His pericardium was closed with loosely reapproximated.  Sternum was closed with #6 stainless steel wire, fascia was closed with interrupted 0 Vicryl, and running 3-0 Vicryl in subcutaneous tissue, and 4-0 subcuticular stitch in skin edges.  Dry dressings were applied.  Sponge and needle counts were reported as correct at the completion of procedure.  The patient tolerated the procedure without obvious complication and was transferred to  the Surgical Intensive Care Unit for further postoperative care.     Lanelle Bal, MD     EG/MEDQ  D:  07/26/2016  T:  07/26/2016  Job:  [id number]  cc:   Peter M. Martinique, M.D.

## 2016-07-26 NOTE — Progress Notes (Signed)
Patient went from sinus rhythm to Afib RVR, heart rates from 120's to 160's. Administered scheduled metoprolol 12.5 mg and 2.5 IV per order around 2232. Gave another 2.5 mg Iv per order at 2246. Patient still remained in the 130's -140's with occasional spike into the 150's. Called rapid response to assess patient. Paged on call MD Roxan Hockey), verbal orders for 150 bolus of amiodarone and another 2.5 mg metoprolol. Patient's heart rate decreased to 120's.  Patient denied any chest pain, nausea or vomiting or dizziness. Patient denied feeling any symptoms of heart fluttering or racing. Will continue to monitor.

## 2016-07-26 NOTE — Progress Notes (Addendum)
      [address]       [place] [address]             [phone]        3 Days Post-Op Procedure(s) (LRB): CORONARY ARTERY BYPASS GRAFTING (CABG), ON PUMP, TIMES SIX, USING LEFT INTERNAL MAMMARY ARTERY, RIGHT GREATER SAPHENOUS VEIN HARVESTED ENDOSCOPICALLY (N/A) TRANSESOPHAGEAL ECHOCARDIOGRAM (TEE) (N/A)  Subjective: Patient sitting in chair doing "arm exercises". Feels heart racing.  Objective: Vital signs in last 24 hours: Temp:  [98 F (36.7 C)-100 F (37.8 C)] 98.5 F (36.9 C) (08/21 0458) Pulse Rate:  [72-155] 72 (08/21 0458) Cardiac Rhythm: Normal sinus rhythm (08/21 0336) Resp:  [13-35] 18 (08/21 0458) BP: (99-134)/(56-86) 114/62 (08/21 0458) SpO2:  [90 %-98 %] 95 % (08/21 0458) Weight:  [204 lb 14.4 oz (92.9 kg)] 204 lb 14.4 oz (92.9 kg) (08/21 0500)  Pre op weight 93 kg Current Weight  07/26/16 204 lb 14.4 oz (92.9 kg)       Intake/Output from previous day: 08/20 0701 - 08/21 0700 In: 754.3 [P.O.:200; I.V.:554.3] Out: -    Physical Exam:  Cardiovascular: IRRR IRRR Pulmonary: Clear to auscultation on right and diminished left base Abdomen: Soft, non tender, bowel sounds present. Extremities: Mild bilateral lower extremity edema. Wounds: Aquacel removed. Sternal wound is clean and dry.  No erythema or signs of infection.  Lab Results: CBC: Recent Labs  07/25/16 0410 07/26/16 0513  WBC 19.3* 15.2*  HGB 10.9* 10.2*  HCT 33.5* 31.4*  PLT 286 271   BMET:  Recent Labs  07/25/16 0410 07/26/16 0513  NA 138 139  K 4.0 4.3  CL 103 101  CO2 28 30  GLUCOSE 147* 120*  BUN 11 14  CREATININE 1.18 1.23  CALCIUM 8.9 8.7*    PT/INR:  Lab Results  Component Value Date   INR 1.44 07/23/2016   INR 1.09 07/21/2016   INR 1.04 07/11/2016   ABG:  INR: Will add last result for INR, ABG once components are confirmed Will add last 4 CBG results once components are confirmed  Assessment/Plan:  1. CV - A fib with RVR this am. On  Amiodarone drip, Norvasc 5 mg daily, and Lopressor 12.5 mg bid. Will stop Norvasc, increase Lopressor to 25 mg bid, give IV Amiodarone bolus, and give one dose of Lopressor IV this am. 2.  Pulmonary - On room air. CXR this am shows left pleural effusion, cardiomegaly.Encourage incentive spirometer. 3. Volume Overload - On Lasix 40 mg daily. 4.  Acute blood loss anemia - H and H stable at 10.2 and 31.4 5. Newly diagnosed DM-CBGs 168/115/121 . Pre op HGA1C 7. Will start oral Metformin later today.Will need to have close follow up with medical doctor after discharge. 6. Remove EPW in am  ZIMMERMAN,DONIELLE MPA-C 07/26/2016,7:20 AM   Patient examined and labs reviewed Patient doing well after CABG 3 days ago with exception of postoperative intermittent atrial fibrillation. Currently maintaining sinus rhythm on IV amiodarone which we'll continue another 24 hours. Hold on starting Coumadin at this time. Patient can continue with his cardiac rehabilitation treatment. Follow left pleural effusion with follow-up chest x-ray later this week-- PA and lateral  Tharon Aquas trigt M.D.

## 2016-07-26 NOTE — Consult Note (Signed)
CARDIOLOGY CONSULT NOTE   Patient ID: RODENY DEUSCHLE MRN: [id number] DOB/AGE: [birth year] 70 y.o.  Admit date: 07/23/2016  Requesting Physician: [name] Primary Physician:   Harrison Mons, PA-C Primary Cardiologist:  Dr. Stanford Breed Reason for Consultation:  afib with RVR  HPI: DIYAN JARMIN is a 70 y.o. male with a history of multivessel CAD s/p CABG this admission, HTN, HLD, CKD stage II,and prostate cancer s/p radical prostatectomy 5/2017who presented to [facility] on  to [facility] on 07/23/2016 for planned CABG. Post op he went into afib with RVR and cardiology was consulted.   He was recently admitted from 07/10/2016 - 07/12/2016 for chest pain.His symptoms were thought to have both atypical and typical components. He underwent nuclear stress test which was abnormal - he subsequently underwent LHC 07/12/2016 showing3-vessel CAD (LAD with 50% ostial, 80% mid, and 70% distal disease, 90% very small OM1, 50% mid RCA, 70% very small PDA, 80% PLOM). Aggressive medical therapy was recommended and he was started on ASA, amlodipine, BB, and a statin. It was recommendedthat if he continued to have angina while on medical therapy, then he should be considered for revascularization with CABG. He was discharged home.  He returned 07/17/16 with chest pain. He ruled out for MI and was discharged on 07/19/16 with CTCS consult on 07/22/16 with plans for CABG surgery on 07/23/16.  He was admitted 07/23/16 for CABG x6V. He initially did well post op but then noted to go into afib with RVR on 07/25/16. He was started on amio bolus and gtt. Patient has been asymptomatic but HRs have been ranging 120-160. Converted to NSR around ~8am. Now resting comfortably. No CP, SOB, LE edema, orthopnea or PND. No dizziness or syncope. He does feel al little nauseated.      Past Medical History:  Diagnosis Date  . Allergy   . Anginal pain ([place])   . Borderline diabetes   . Bronchitis    recurrent  . CAD in native  artery    a. Multivessel CAD by cath 07/2016 - tx with medical therapy with consideration of CABG for refractory angina.  . Essential hypertension   . Fracture, finger    LEFT HAND WITH SWELLING AND BRUISING  . Heart murmur    ? teen  . History of gout   . History of kidney stones   . History of kidney stones   . History of skin cancer    possible - pt not sure of dx  . Hyperlipidemia   . Pneumonia    hx  . Prostate cancer [facility])    a. s/p radical prostatectomy 04/2016.  Marland Kitchen Sinus infection    recurrent     Past Surgical History:  Procedure Laterality Date  . CARDIAC CATHETERIZATION N/A 07/12/2016   Procedure: Left Heart Cath and Coronary Angiography;  Surgeon: Peter M Martinique, MD;  Location: [place] [facility];  Service: Cardiovascular;  Laterality: N/A;  . CORONARY ARTERY BYPASS GRAFT N/A 07/23/2016   Procedure: CORONARY ARTERY BYPASS GRAFTING (CABG), ON PUMP, TIMES SIX, USING LEFT INTERNAL MAMMARY ARTERY, RIGHT GREATER SAPHENOUS VEIN HARVESTED ENDOSCOPICALLY;  Surgeon: Grace Isaac, MD;  Location: [place];  Service: Open Heart Surgery;  Laterality: N/A;  LIMA to LAD, SVG to DIAG, SEQ SVG to INTERMEDIATE and CIRC, SVG to DISTAL RIGHT, SVG to MID POSTERIOR LATERAL.  Marland Kitchen EYE SURGERY     B cataracts removed.  Marland Kitchen EYE SURGERY     lens implants  .  LITHOTRIPSY     98  . LYMPHADENECTOMY Bilateral 04/05/2016   Procedure: LYMPHADENECTOMY, BILATERAL PELVIC LYMPHADENECTOMY;  Surgeon: Raynelle Bring, MD;  Location: [facility];  Service: Urology;  Laterality: Bilateral;  . PROSTATE BIOPSY    . ROBOT ASSISTED LAPAROSCOPIC RADICAL PROSTATECTOMY N/A 04/05/2016   Procedure: XI ROBOTIC ASSISTED LAPAROSCOPIC RADICAL PROSTATECTOMY LEVEL 2;  Surgeon: Raynelle Bring, MD;  Location: [facility];  Service: Urology;  Laterality: N/A;  . TEE WITHOUT CARDIOVERSION N/A 07/23/2016   Procedure: TRANSESOPHAGEAL ECHOCARDIOGRAM (TEE);  Surgeon: Grace Isaac, MD;  Location: [place];  Service: Open Heart Surgery;  Laterality: N/A;    . TONSILLECTOMY AND ADENOIDECTOMY     also received radiation treatments, as was frequent in children at the time  . WISDOM TOOTH EXTRACTION      Allergies  Allergen Reactions  . Penicillins Swelling and Other (See Comments)    As a child. Has patient had a PCN reaction causing immediate rash, facial/tongue/throat swelling, SOB or lightheadedness with hypotension: Yes Has patient had a PCN reaction causing severe rash involving mucus membranes or skin necrosis: No Has patient had a PCN reaction that required hospitalization: Was already in hospital when reaction happened Has patient had a PCN reaction occurring within the last 10 years: No  If all of the above answers are "NO", then may proceed with Cephalosporin use.     I have reviewed the patient's current medications . acetaminophen  1,000 mg Oral Q6H   Or  . acetaminophen (TYLENOL) oral liquid 160 mg/5 mL  1,000 mg Per Tube Q6H  . amiodarone  400 mg Oral BID  . aspirin EC  325 mg Oral Daily   Or  . aspirin  324 mg Per Tube Daily  . atorvastatin  40 mg Oral q1800  . bisacodyl  10 mg Oral Daily   Or  . bisacodyl  10 mg Rectal Daily  . docusate sodium  200 mg Oral Daily  . enoxaparin (LOVENOX) injection  40 mg Subcutaneous QHS  . furosemide  40 mg Oral Daily  . insulin aspart  0-15 Units Subcutaneous TID WC  . insulin aspart  0-5 Units Subcutaneous QHS  . metFORMIN  500 mg Oral BID WC  . metoprolol tartrate  25 mg Oral BID   Or  . metoprolol tartrate  25 mg Per Tube BID  . pantoprazole  40 mg Oral Daily  . potassium chloride  20 mEq Oral Daily  . sodium chloride flush  3 mL Intravenous Q12H   . sodium chloride 10 mL/hr at 07/26/16 0118  . sodium chloride    . sodium chloride 10 mL/hr at 07/24/16 0700  . amiodarone 30 mg/hr (07/26/16 0049)  . dexmedetomidine Stopped (07/23/16 1813)  . lactated ringers 10 mL/hr at 07/24/16 0700  . lactated ringers 10 mL/hr at 07/24/16 0700   sodium chloride, alum & mag  hydroxide-simeth, magnesium hydroxide, metoprolol, midazolam, morphine injection, ondansetron (ZOFRAN) IV, oxyCODONE, sodium chloride flush, sodium chloride flush, traMADol, zolpidem  Prior to Admission medications   Medication Sig Start Date End Date Taking? Authorizing Provider  amLODipine (NORVASC) 5 MG tablet Take 1 tablet (5 mg total) by mouth daily. 07/12/16  Yes Debbe Odea, MD  aspirin EC 81 MG EC tablet Take 1 tablet (81 mg total) by mouth daily. 07/12/16  Yes Debbe Odea, MD  atorvastatin (LIPITOR) 40 MG tablet Take 1 tablet (40 mg total) by mouth daily at 6 PM. 07/12/16  Yes Debbe Odea, MD  isosorbide mononitrate (IMDUR) 30  MG 24 hr tablet Take 0.5 tablets (15 mg total) by mouth daily. 07/19/16  Yes Luke K Kilroy, PA-C  metoprolol succinate (TOPROL-XL) 25 MG 24 hr tablet Take 1 tablet (25 mg total) by mouth daily. 07/19/16  Yes Erlene Quan, PA-C  acetaminophen (TYLENOL) 325 MG tablet Take 2 tablets (650 mg total) by mouth every 4 (four) hours as needed for headache or mild pain. 07/19/16   Erlene Quan, PA-C  nitroGLYCERIN (NITROSTAT) 0.4 MG SL tablet Place 1 tablet (0.4 mg total) under the tongue every 5 (five) minutes as needed for chest pain. 07/19/16   Erlene Quan, PA-C     Social History   Social History  . Marital status: Married    Spouse name: [name]  . Number of children: 3  . Years of education: N/A   Occupational History  . 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 Smoking status: Never Smoker  . Smokeless tobacco: Never Used  . Alcohol use 1.2 oz/week    2 Standard drinks or equivalent per week     Comment: wine - red 2 glasses wine per week  . Drug use: No  . Sexual activity: Yes    Partners: Female   Other Topics Concern  . Not on file   Social History Narrative   Marital status: married x 25 years; second marriage; first marriage x 12 years.      Children: 3 daughters and one  step-daughter; 4 grandchildren      Lives: with wife.      Employment: semi-retired; Company secretary; professor; Social worker, Probation officer.      Tobacco:  no      Alcohol: about 2 glasses of wine/week      Exercise: active in the yard.    Family Status  Relation Status  . Father Deceased at age 33   AMI  . Brother Deceased at age 79   AMI  . Maternal Grandmother Deceased  . Mother Deceased at age 71   pneumonia  . Daughter Alive  . Maternal Grandfather Deceased  . Paternal Grandmother Deceased  . Paternal Grandfather Deceased  . Daughter Alive  . Daughter Alive  . Neg Hx    Family History  Problem Relation Age of Onset  . Heart attack Father   . Heart disease Father 91    AMI as cause of death  . Diabetes Father   . Hyperlipidemia Father   . Parkinson's disease Brother   . Diabetes Brother   . Cancer Brother     melanoma retina  . Heart disease Brother   . Stroke Maternal Grandmother   . Cancer Mother 77    Hodgkins   . Glaucoma Mother   . Colon cancer Neg Hx   . Colon polyps Neg Hx        ROS:  Full 14 point review of systems complete and found to be negative unless listed above.  Physical Exam: Blood pressure 118/78, pulse 78, temperature 98.2 F (36.8 C), temperature source Oral, resp. rate 18, height 5\' 9"  (1.753 m), weight 204 lb 14.4 oz (92.9 kg), SpO2 97 %.  General: Well developed, well nourished, male in no acute distress Head: Eyes PERRLA, No xanthomas.   Normocephalic and atraumatic, oropharynx without edema or exudate. Lungs: CTAB Heart: HRRR S1 S2, no rub/gallop, Heart regular rate and rhythm with S1, S2  murmur. pulses are 2+ extrem.   Neck: No  carotid bruits. No lymphadenopathy.  No JVD. Abdomen: Bowel sounds present, abdomen soft and non-tender without masses or hernias noted. Msk:  No spine or cva tenderness. No weakness, no joint deformities or effusions. Extremities: No clubbing or cyanosis.  No LE edema.  Neuro: Alert and oriented X 3. No focal deficits  noted. Psych:  Good affect, responds appropriately Skin: No rashes or lesions noted.  Labs:   Lab Results  Component Value Date   WBC 15.2 (H) 07/26/2016   HGB 10.2 (L) 07/26/2016   HCT 31.4 (L) 07/26/2016   MCV 94.0 07/26/2016   PLT 271 07/26/2016   No results for input(s): INR in the last 72 hours.  Recent Labs Lab 07/21/16 1618  07/26/16 0513  NA 143  < > 139  K 3.9  < > 4.3  CL 108  < > 101  CO2 27  < > 30  BUN 18  < > 14  CREATININE 1.25*  < > 1.23  CALCIUM 9.6  < > 8.7*  PROT 6.6  --   --   BILITOT 0.5  --   --   ALKPHOS 108  --   --   ALT 24  --   --   AST 15  --   --   GLUCOSE 158*  < > 120*  ALBUMIN 3.5  --   --   < > = values in this interval not displayed. Magnesium  Date Value Ref Range Status  07/24/2016 2.1 1.7 - 2.4 mg/dL Final   No results for input(s): CKTOTAL, CKMB, TROPONINI in the last 72 hours. No results for input(s): TROPIPOC in the last 72 hours. No results found for: PROBNP Lab Results  Component Value Date   CHOL 196 07/10/2016   HDL 24 (L) 07/10/2016   LDLCALC 99 07/10/2016   TRIG 365 (H) 07/10/2016    Echo:: 07/19/2016 LV EF: 55% Study Conclusions - Left ventricle: The cavity size was normal. Wall thickness was   increased in a pattern of mild LVH. The estimated ejection   fraction was 55%. Wall motion was normal; there were no regional   wall motion abnormalities. Doppler parameters are consistent with   abnormal left ventricular relaxation (grade 1 diastolic   dysfunction). - Aortic valve: There was no stenosis. There was mild   regurgitation. - Mitral valve: There was trivial regurgitation. - Right ventricle: The cavity size was normal. Systolic function   was normal. - Right atrium: The atrium was mildly dilated. - Tricuspid valve: Peak RV-RA gradient (S): 15 mm Hg. - Pulmonary arteries: PA peak pressure: 18 mm Hg (S). - Inferior vena cava: The vessel was normal in size. The   respirophasic diameter changes were in the  normal range (>= 50%),   consistent with normal central venous pressure. Impressions: - Normal LV size with mild LV hypertrophy. EF 55%. Normal RV size   and systolic function. Mild aortic insufficiency.   ECG:  NSR.   Radiology:  Dg Chest Port 1 View  Result Date: 07/25/2016 CLINICAL DATA:  CABG x6 EXAM: PORTABLE CHEST 1 VIEW COMPARISON:  Yesterday FINDINGS: Swan-Ganz catheter removal with right IJ Cordis sheath remaining in place. Patient rotated left. Left chest tube has been removed. Prior median sternotomy. Cardiomegaly accentuated by AP portable technique. Similar small left pleural effusion. No pneumothorax. Increased left base airspace disease. IMPRESSION: Persistent left pleural effusion with increased left base Airspace disease, likely atelectasis. Cardiomegaly without congestive failure. Left chest tube removal, without pneumothorax. Electronically Signed  By: Abigail Miyamoto M.D.   On: 07/25/2016 07:30   Procedures 07/12/16 Left Heart Cath and Coronary Angiography  Conclusion    RPDA lesion, 70 %stenosed.  1st RPLB lesion, 80 %stenosed.  LM lesion, 30 %stenosed.  Prox Cx lesion, 30 %stenosed.  Ost 1st Mrg to 1st Mrg lesion, 90 %stenosed.  Dist LAD lesion, 70 %stenosed.  The left ventricular systolic function is normal.  LV end diastolic pressure is mildly elevated.  The left ventricular ejection fraction is 55-65% by visual estimate.  Mid RCA lesion, 50 %stenosed.  Mid LAD lesion, 80 %stenosed.  Prox LAD lesion, 50 %stenosed.   1. Moderate 3 vessel obstructive CAD.    - LAD with 50% ostial, 80% mid, and 70% distal disease.    - 90% very small OM1    - 50% mid RCA, 70% very small PDA, 80% PLOM 2. Good LV function  Plan: recommend aggressive medical therapy. Would continue ASA, amlodipine. Add beta blocker and statin. Patient has only had one episode of chest pain and a low risk Myoview. If he continues to have angina on medical therapy then we should consider  him for revascularization with CABG. Will need close follow up as outpatient.       ASSESSMENT AND PLAN:    Principal Problem:   Atrial fibrillation with rapid ventricular response (HCC) Active Problems:   Diabetes mellitus type 2 in nonobese (HCC)   Prostate cancer (HCC)   CKD (chronic kidney disease), stage II   Dyslipidemia   CAD (coronary artery disease)   Essential hypertension   S/P CABG (coronary artery bypass graft)  MATHUE MENKEN is a 70 y.o. male with a history of multivessel CAD s/p CABG this admission, HTN, HLD, CKD stage II,and prostate cancer s/p radical prostatectomy 5/2017who presented to [facility] on  to [facility] on 07/23/2016 for planned CABG. Post op he went into afib with RVR and cardiology was consulted.   Post op afib: patient was largely asymptomatic. Started on an amio gtt and now on PO 400mg  BID (still on amio gtt to stop 1 hour after PO dose given) and lopressor 25mg  BID. Now back in NSR. Likely just related to post op afib. If it recurs in future he would require [place] for CHADSVASC of 4 (HTN, age, DM, vasc dz).   CAD s/p CABGx 6V: continue ASA, statin and BB.  HTN: BP well controlled.   DMT2: HgA1c 7.0  HLD: LDL 99. Goal <70. Cont statin. Would increase to atrova 80mg  daily.    Signed:  Angelena Form, PA-C 07/26/2016 3:04 PM  Pager [phone]  Co-Sign MD  As above, patient seen and examined. Briefly he is a 70 year old male who is status post coronary artery bypass graft on August 18 for evaluation of postoperative atrial fibrillation. Patient has minimal dyspnea. He denies chest pain other than soreness. There are no palpitations. He developed atrial fibrillation and was placed on amiodarone. He has now converted to sinus rhythm.  1 postoperative atrial fibrillation-patient is in sinus at present. We will continue amiodarone for 8-12 weeks. If he maintains sinus rhythm we will discontinue at that time. Continue metoprolol. Embolic risk factors  include coronary artery disease, age greater than 57, diabetes mellitus and hypertension. CHADSvasc 4. If atrial fibrillation recurs he may need short-term Coumadin.  2 status post coronary artery bypass graft-continue aspirin, statin and beta blocker.  3 hyperlipidemia-given coronary artery disease would increase Lipitor to 80 mg daily.  4 diabetes mellitus  5 hypertension-  controlled. Continue present medications.  6 history of mildly dilated aortic root-he will need follow-up CT scan of his thoracic aorta March 2018.  Kirk Ruths, MD

## 2016-07-27 LAB — GLUCOSE, CAPILLARY
Glucose-Capillary: 145 mg/dL — ABNORMAL HIGH (ref 65–99)
Glucose-Capillary: 169 mg/dL — ABNORMAL HIGH (ref 65–99)
Glucose-Capillary: 165 mg/dL — ABNORMAL HIGH (ref 65–99)
Glucose-Capillary: 140 mg/dL — ABNORMAL HIGH (ref 65–99)

## 2016-07-27 LAB — BASIC METABOLIC PANEL
Anion gap: 9 (ref 5–15)
BUN: 17 mg/dL (ref 6–20)
CO2: 26 mmol/L (ref 22–32)
Calcium: 8.1 mg/dL — ABNORMAL LOW (ref 8.9–10.3)
Chloride: 102 mmol/L (ref 101–111)
Creatinine, Ser: 1.22 mg/dL (ref 0.61–1.24)
GFR calc Af Amer: >60 mL/min (ref >60)
GFR calc non Af Amer: 59 mL/min — ABNORMAL LOW (ref >60)
Glucose, Bld: 136 mg/dL — ABNORMAL HIGH (ref 65–99)
Potassium: 3.3 mmol/L — ABNORMAL LOW (ref 3.5–5.1)
Sodium: 137 mmol/L (ref 135–145)

## 2016-07-27 MED ORDER — POTASSIUM CHLORIDE CRYS ER 20 MEQ PO TBCR
40.0000 meq | EXTENDED_RELEASE_TABLET | Freq: Once | ORAL | Status: AC
  Administered 2016-07-27: 40 meq via ORAL
  Filled 2016-07-27: qty 2

## 2016-07-27 MED ORDER — AMIODARONE HCL 200 MG PO TABS
200.0000 mg | ORAL_TABLET | Freq: Two times a day (BID) | ORAL | Status: DC
  Administered 2016-07-27 (×2): 200 mg via ORAL
  Filled 2016-07-27 (×2): qty 1

## 2016-07-27 MED ORDER — POTASSIUM CHLORIDE CRYS ER 10 MEQ PO TBCR
30.0000 meq | EXTENDED_RELEASE_TABLET | Freq: Once | ORAL | Status: AC
  Administered 2016-07-27: 30 meq via ORAL
  Filled 2016-07-27: qty 1

## 2016-07-27 NOTE — Progress Notes (Signed)
Patient's EPW have been pulled. Patient tolerated it well. The wires were intact. Patient has been instructed to remain on bedrest for an hour with vital signs being taken every 15 minutes. Will continue to monitor.

## 2016-07-27 NOTE — Progress Notes (Addendum)
      [address]       [place] [address]             [phone]        4 Days Post-Op Procedure(s) (LRB): CORONARY ARTERY BYPASS GRAFTING (CABG), ON PUMP, TIMES SIX, USING LEFT INTERNAL MAMMARY ARTERY, RIGHT GREATER SAPHENOUS VEIN HARVESTED ENDOSCOPICALLY (N/A) TRANSESOPHAGEAL ECHOCARDIOGRAM (TEE) (N/A)  Subjective: Patient no longer with nausea. Had large bowel movement last night.  Objective: Vital signs in last 24 hours: Temp:  [97.6 F (36.4 C)-98.2 F (36.8 C)] 98.1 F (36.7 C) (08/22 0433) Pulse Rate:  [65-78] 65 (08/22 0433) Cardiac Rhythm: Normal sinus rhythm;Bundle branch block (08/21 1900) Resp:  [18] 18 (08/22 0433) BP: (118-135)/(62-78) 135/65 (08/22 0433) SpO2:  [96 %-99 %] 97 % (08/22 0433) Weight:  [202 lb 3.2 oz (91.7 kg)] 202 lb 3.2 oz (91.7 kg) (08/22 0433)  Pre op weight 93 kg Current Weight  07/27/16 202 lb 3.2 oz (91.7 kg)       Intake/Output from previous day: 08/21 0701 - 08/22 0700 In: 643 [P.O.:340; I.V.:303] Out: 1101 [Urine:1100; Stool:1]   Physical Exam:  Cardiovascular: RRR Pulmonary: Clear to auscultation on right and diminished left base Abdomen: Soft, non tender, bowel sounds present. Extremities: Mild bilateral lower extremity edema. Wounds: Aquacel removed. Sternal wound is clean and dry.  No erythema or signs of infection.  Lab Results: CBC:  Recent Labs  07/25/16 0410 07/26/16 0513  WBC 19.3* 15.2*  HGB 10.9* 10.2*  HCT 33.5* 31.4*  PLT 286 271   BMET:   Recent Labs  07/26/16 0513 07/27/16 0243  NA 139 137  K 4.3 3.3*  CL 101 102  CO2 30 26  GLUCOSE 120* 136*  BUN 14 17  CREATININE 1.23 1.22  CALCIUM 8.7* 8.1*    PT/INR:  Lab Results  Component Value Date   INR 1.44 07/23/2016   INR 1.09 07/21/2016   INR 1.04 07/11/2016   ABG:  INR: Will add last result for INR, ABG once components are confirmed Will add last 4 CBG results once components are  confirmed  Assessment/Plan:  1. CV - A fib with RVR yesterday. Converted to SR in the mid am yesterday and remains in SR this am. On Amiodarone 400 mg bid and Lopressor 25 mg bid. HR in the 60's so will decrease Amiodarone. Will consider starting low dose ACE in am. 2.  Pulmonary - On room air. Check CXR in am for follow up of small left pleural effusion. Encourage incentive spirometer. 3. Volume Overload - On Lasix 40 mg daily. 4.  Acute blood loss anemia - H and H stable at 10.2 and 31.4 5. Newly diagnosed DM-CBGs 166/141/145 . Pre op HGA1C 7. Continue Metformin.Will need to have close follow up with medical doctor after discharge. 6. Remove EPW  7. Supplement potassium 8. Possibly home in am  ZIMMERMAN,DONIELLE MPA-C 07/27/2016,7:19 AM   Patient seen and examined, agree with above Now in SR. If a fib recurs will need oral anticoagulation Dc central line  Remo Lipps C. Roxan Hockey, MD [facility] [phone]

## 2016-07-27 NOTE — Care Management Important Message (Signed)
Important Message  Patient Details  Name: Lance Stevens MRN: [id number] Date of Birth: [birth year]   Medicare Important Message Given:  Yes    Loann Quill 07/27/2016, 10:16 AM

## 2016-07-27 NOTE — Progress Notes (Signed)
CARDIAC REHAB PHASE I   PRE:  Rate/Rhythm: 77 SR  BP:  Supine:   Sitting: 142/73  Standing:    SaO2: 97%RA  MODE:  Ambulation: 500 ft   POST:  Rate/Rhythm: 78 SR  BP:  Supine: 140/80  Sitting:   Standing:    SaO2: 100%RA 1016-1100 Pt walked 500 ft on RA with rolling walker and minimal asst . Tolerated well. Remained in NSR. To bed after walk.   Graylon Good, RN BSN  07/27/2016 10:59 AM

## 2016-07-28 ENCOUNTER — Inpatient Hospital Stay (HOSPITAL_COMMUNITY): Payer: Medicare Other

## 2016-07-28 LAB — GLUCOSE, CAPILLARY
Glucose-Capillary: 135 mg/dL — ABNORMAL HIGH (ref 65–99)
Glucose-Capillary: 168 mg/dL — ABNORMAL HIGH (ref 65–99)
Glucose-Capillary: 177 mg/dL — ABNORMAL HIGH (ref 65–99)
Glucose-Capillary: 123 mg/dL — ABNORMAL HIGH (ref 65–99)

## 2016-07-28 MED ORDER — LIVING WELL WITH DIABETES BOOK: 1.0000 | Freq: Once | Status: AC

## 2016-07-28 MED ORDER — POTASSIUM CHLORIDE ER 20 MEQ PO TBCR
20.0000 meq | EXTENDED_RELEASE_TABLET | Freq: Every day | ORAL | 0 refills | Status: DC
Start: 2016-07-28 — End: 2016-07-29

## 2016-07-28 MED ORDER — ONDANSETRON HCL 4 MG PO TABS: 4.0000 mg | ORAL_TABLET | Freq: Three times a day (TID) | ORAL | 0 refills | Status: DC | PRN

## 2016-07-28 MED ORDER — BLOOD GLUCOSE MONITOR KIT: PACK | 0 refills | Status: DC

## 2016-07-28 MED ORDER — AMIODARONE HCL 200 MG PO TABS: 200.0000 mg | ORAL_TABLET | Freq: Every day | ORAL | 1 refills | Status: DC

## 2016-07-28 MED ORDER — METOPROLOL SUCCINATE ER 50 MG PO TB24: 50.0000 mg | ORAL_TABLET | Freq: Every day | ORAL | 1 refills | Status: DC

## 2016-07-28 MED ORDER — ATORVASTATIN CALCIUM 80 MG PO TABS: 80.0000 mg | ORAL_TABLET | Freq: Every day | ORAL | 1 refills | Status: DC

## 2016-07-28 MED ORDER — FUROSEMIDE 40 MG PO TABS: 40.0000 mg | ORAL_TABLET | Freq: Every day | ORAL | 0 refills | Status: DC

## 2016-07-28 MED ORDER — ONDANSETRON HCL 4 MG/2ML IJ SOLN: 4.0000 mg | Freq: Four times a day (QID) | INTRAMUSCULAR | 0 refills | Status: DC | PRN

## 2016-07-28 MED ORDER — LIVING WELL WITH DIABETES BOOK
Freq: Once | Status: AC
  Administered 2016-07-28: 10:00:00
  Filled 2016-07-28: qty 1

## 2016-07-28 MED ORDER — TRAMADOL HCL 50 MG PO TABS: 50.0000 mg | ORAL_TABLET | Freq: Four times a day (QID) | ORAL | 0 refills | Status: DC | PRN

## 2016-07-28 MED ORDER — METFORMIN HCL 500 MG PO TABS: 500.0000 mg | ORAL_TABLET | Freq: Two times a day (BID) | ORAL | 1 refills | Status: DC

## 2016-07-28 MED ORDER — ASPIRIN 325 MG PO TBEC: 325.0000 mg | DELAYED_RELEASE_TABLET | Freq: Every day | ORAL | 0 refills | Status: DC

## 2016-07-28 MED ORDER — AMIODARONE HCL 200 MG PO TABS
200.0000 mg | ORAL_TABLET | Freq: Every day | ORAL | Status: DC
  Administered 2016-07-28: 200 mg via ORAL
  Filled 2016-07-28: qty 1

## 2016-07-28 NOTE — Progress Notes (Signed)
Nurse informed me patient vomited once.  He does not have any abdominal pain. He has had another bowel movement. The etiology of his nausea/vomiting may be Amiodarone. I stopped this. Hopefully, he continues to maintain sinus rhythm.

## 2016-07-28 NOTE — Progress Notes (Addendum)
Inpatient Diabetes Program Recommendations  AACE/ADA: New Consensus Statement on Inpatient Glycemic Control (2015)  Target Ranges:  Prepandial:   less than 140 mg/dL      Peak postprandial:   less than 180 mg/dL (1-2 hours)      Critically ill patients:  140 - 180 mg/dL   Results for Lance Stevens, Lance Stevens (MRN [id number]) as of 07/28/2016 10:04  Ref. Range 07/21/2016 16:18  Hemoglobin A1C Latest Ref Range: 4.8 - 5.6 % 7.0 (H)   Inpatient Diabetes Program Recommendations:   Spoke with pt about new diagnosis. Discussed A1C results with them and explained what an A1C is, basic pathophysiology of DM Type 2, basic home care, basic diabetes diet nutrition principles, importance of checking CBGs and maintaining good CBG control to prevent long-term and short-term complications. Reviewed signs and symptoms of hyperglycemia and hypoglycemia and how to treat hypoglycemia at home. Also reviewed blood sugar goals at home.  RNs to provide ongoing basic DM education at bedside with this patient. Have ordered educational booklet, and DM videos. Have also placed RD consult for DM diet education for this patient.  Patient very receptive to learning more about diabetes and to attend outpatient DM education. Thank you for the referral. Will need blood glucose meter kit prescription # [id number].  Thank you, Nani Gasser. Jewel Mcafee, RN, MSN, CDE Inpatient Glycemic Control Team Team Pager [phone] (8am-5pm) 07/28/2016 10:06 AM

## 2016-07-28 NOTE — Progress Notes (Signed)
CARDIAC REHAB PHASE I   PRE:  Rate/Rhythm: 75 SR  BP:  Supine:   Sitting: 130/70  Standing:    SaO2: 95%RA  MODE:  Ambulation: 690 ft   POST:  Rate/Rhythm: 97 SR  BP:  Supine:   Sitting: 140/80  Standing:    SaO2: 99%RA 0825-0846 Pt walked 690 ft on RA with hand held asst. Tolerated well. To chair after walk. Will return later for ed with wife.   Graylon Good, RN BSN  07/28/2016 8:43 AM

## 2016-07-28 NOTE — Progress Notes (Addendum)
      [address]       ,[place] [address]             [phone]        5 Days Post-Op Procedure(s) (LRB): CORONARY ARTERY BYPASS GRAFTING (CABG), ON PUMP, TIMES SIX, USING LEFT INTERNAL MAMMARY ARTERY, RIGHT GREATER SAPHENOUS VEIN HARVESTED ENDOSCOPICALLY (N/A) TRANSESOPHAGEAL ECHOCARDIOGRAM (TEE) (N/A)  Subjective: Patient with intermittent nausea and decreased appetite.  Objective: Vital signs in last 24 hours: Temp:  [98 F (36.7 C)-98.9 F (37.2 C)] 98.2 F (36.8 C) (08/23 0542) Pulse Rate:  [66-74] 72 (08/23 0542) Cardiac Rhythm: Normal sinus rhythm (08/22 1900) Resp:  [18] 18 (08/23 0542) BP: (119-142)/(46-77) 127/65 (08/23 0542) SpO2:  [96 %-98 %] 97 % (08/23 0542) Weight:  [197 lb 12.8 oz (89.7 kg)] 197 lb 12.8 oz (89.7 kg) (08/23 0542)  Pre op weight 93 kg Current Weight  07/28/16 197 lb 12.8 oz (89.7 kg)       Intake/Output from previous day: 08/22 0701 - 08/23 0700 In: 600 [P.O.:600] Out: 1052 [Urine:1050; Stool:2]   Physical Exam:  Cardiovascular: RRR Pulmonary: Clear to auscultation on right and slightly diminished left base Abdomen: Soft, non tender, bowel sounds present. Extremities: Trace bilateral lower extremity edema. Wounds: Aquacel removed. Sternal wound is clean and dry.  No erythema or signs of infection.  Lab Results: CBC:  Recent Labs  07/26/16 0513  WBC 15.2*  HGB 10.2*  HCT 31.4*  PLT 271   BMET:   Recent Labs  07/26/16 0513 07/27/16 0243  NA 139 137  K 4.3 3.3*  CL 101 102  CO2 30 26  GLUCOSE 120* 136*  BUN 14 17  CREATININE 1.23 1.22  CALCIUM 8.7* 8.1*    PT/INR:  Lab Results  Component Value Date   INR 1.44 07/23/2016   INR 1.09 07/21/2016   INR 1.04 07/11/2016   ABG:  INR: Will add last result for INR, ABG once components are confirmed Will add last 4 CBG results once components are confirmed  Assessment/Plan:  1. CV - A fib with RVR previously. Remains in SR. On Amiodarone  200 mg bid and Lopressor 25 mg bid. Will decrease Amiodarone to 200 mg daily as with nausea. 2.  Pulmonary - On room air. CXR this am appears to show small left pleural effusion. Encourage incentive spirometer. 3. Volume Overload - On Lasix 40 mg daily. 4.  Acute blood loss anemia - H and H stable at 10.2 and 31.4 5. Newly diagnosed DM-CBGs 156/140/135 . Pre op HGA1C 7. Continue Metformin. Will ask diabetes coordinator to evaluate. Will also need to have close follow up with medical doctor after discharge. 6.Will discharge later today  ZIMMERMAN,DONIELLE MPA-C 07/28/2016,7:25 AM    Chart reviewed, patient examined, agree with above. No further atrial fib. He feels like low grade nausea is related to meds so I agree with decreasing amio to 200 mg daily. He has been avoiding narcotics and taking tylenol. He feels like he is ready to go home.

## 2016-07-28 NOTE — Discharge Summary (Signed)
Physician Discharge Summary       [address]       [place] [address]             [phone]    Patient ID: Lance Stevens MRN: [id number] DOB/AGE: 09-Sep-1946 70 y.o.  Admit date: 07/23/2016 Discharge date: 08/02/2016  Admission Diagnoses: Multivessel coronary artery disease  Active Diagnoses:  1. Essential hypertension 2. Hyperlipidemia 3. CKD (chronic kidney disease), stage II 4. Diabetes mellitus type 2 in nonobese (HCC) 5. Prostate cancer (HCC)-s/p radical prostatectomy 04/2016. 6. History of gout 7. History of kidney stones 8. Atrial fibrillation with rapid ventricular response ([place]) 9. Mild ABL anemia    Consults: cardiology  Procedure (s):  Coronary artery bypass grafting x6 with the left internal mammary to the left anterior descending coronary artery, reversed saphenous vein graft to the diagonal coronary artery, sequential reversed saphenous vein graft to the intermediate and circumflex coronary artery, sequential reversed saphenous vein graft to the distal right coronary artery and distal posterior lateral branch of the right coronary artery, with greater saphenous vein harvesting endoscopically, right thigh and calf by [name] on 07/23/2016.  History of Presenting Illness: He was in the hospital from 07/10/2016 - 07/12/2016 for chest tightness as well. His symptoms were thought to have both atypical and typical components, stress test was donewas initially. This showed no evidence of reversible ischemia but a moderate region of fixed defect in the inferolateral wall extending to the true apex suggesting an area of prior infarct or scarring versus diaphragmatic attenuation artifact. Dr. Haroldine Laws reviewed this along with a chest CT which showed 3 coronary calcification and mildly dilated ascending aorta was noted 4.1 cm.  Dr. Haroldine Laws recommended a cardiac catheterization.  The CT scan was done in March 2017 in Urology office to rule out  bony mets from prostate cancer. Cardiac cath was done on 07/12/2016 and showed 3-vessel CAD (LAD with 50% ostial, 80% mid, and 70% distal disease, 90% very small OM1, 50% mid RCA, 70% very small PDA, 80% PLOM.) Aggressive medical therapy was recommended and he was started on ASA, Amlodipine, BB, and a statin. The patient is unsure about his medications but appears he was started on statin, Lopressor 12.5 mg bid, and Norvasc 25m for bp 220/120 on admission. Dr JMartiniquerecommended medical therapy, and if he continued to have angina consider CABG. Since being discharged five days ago, he reports doing well until 07/23/2016 . Had been taking his new medications as prescribed without any symptoms. This morning, he developed pain around 0400 which awoke him from sleep. He says it has been constant since but increasing in severity. He had no association with rest or exertion. He had mild diaphoresis this AM. No nausea, vomiting, or dyspnea.   While in the ED, labs have showna WBC of 16.4, Hgb 14.5, platelets 282. Electrolytes WNL. Creatinine 1.18. BNP 50.1. Initial troponin negative. CXR with no active cardiopulmonary disease. EKG shows NSR, HR 77, with RBBB and down-sloping ST in Leads I and II.    I have independently reviewed the above cath films and reviewed the findings with the  patient . I have reviewed the films with Dr CBurt Knackand Dr VDarcey Norawhom concurr CABG is best treatment option. Pre operative carotid duplex showed no significant internal carotid artery stenosis bilaterally. He underwent a CABG x 6 on 07/23/2016.  Brief Hospital Course:  The patient was extubated the evening of surgery without difficulty. He/she remained afebrile and hemodynamically stable. SLuiz Blare  Ganz, a line, chest tubes, and foley were removed early in the post operative course. Lopressor was started and titrated accordingly. He  was volume over loaded and diuresed. He had ABL anemia. He did not require a post op transfusion.  His last H and H was 10.2 and 31.4. He was weaned off the insulin drip.The patient's HGA1C pre op was 7. He was started on Metformin 500 mg bid. The patient's glucose remained well controlled. A consult was obtained with diabetes coordinator at the hospital. He will also need close follow up with his medical doctor after discharge.  The patient was felt surgically stable for transfer from the ICU to PCTU for further convalescence on 07/25/2016. He went into a fib with RVR. He was started on an Amiodarone dirp and received a couple of Amiodarone boluses and IV Lopressor. He converted to sinus rhythm and then remained in sinus rhythm. He continues to progress with cardiac rehab. He was ambulating on room air. He has been tolerating a diet and has had a bowel movement. He did have intermittent nausea and vomited once on 08/23. He had no abdominal pain and had another bowel movement on 08/23. Amiodarone was stopped as it was felt this may be etiology of nausea and vomiting. Nausea resolved and he has had no further emesis. He is tolerating a diet. He developed lower sternal drainage on 08/24. There was NO cellulitis.  The sternum had no instability. There was a "pinpoint" opening at lower sternal wound that continued to have sero sanguinous drainage. As a result, he remained in the hospital. Epicardial pacing wires have already been removed. Chest tube sutures will be removed today. The patient is felt surgically stable for discharge today. He will follow-up with [name] on 8/31 at 10:30am for a sternal incision check. Chest xray today showed improved atelectasis without pleural effusion. He is feeling better and ready for discharge.     Latest Vital Signs: Blood pressure 111/62, pulse 72, temperature 97.6 F (36.4 C), temperature source Oral, resp. rate 18, height _0  (1.753 m), weight 193 lb 1.6 oz (87.6 kg), SpO2 100 %.   Physical Exam: Cardiovascular: RRR Pulmonary: Clear to auscultation on right  and slightly diminished left base Abdomen: Soft, non tender, bowel sounds present. Extremities: Nol lower extremity edema. Wounds: Sternal and right lower extremity wounds are clean and dry.  No erythema or signs of infection.  Discharge Condition:Stable and discharged to home.  Recent laboratory studies:  Lab Results  Component Value Date   WBC 10.4 08/02/2016   HGB 11.0 (L) 08/02/2016   HCT 33.5 (L) 08/02/2016   MCV 94.4 08/02/2016   PLT 470 (H) 08/02/2016   Lab Results  Component Value Date   NA 137 08/01/2016   K 5.3 (H) 08/01/2016   CL 101 08/01/2016   CO2 29 08/01/2016   CREATININE 1.41 (H) 08/01/2016   GLUCOSE 132 (H) 08/01/2016    Diagnostic Studies: Dg Chest 2 View  Result Date: 07/28/2016 CLINICAL DATA:  Status post coronary artery bypass grafting with left base atelectasis/consolidation EXAM: CHEST  2 VIEW COMPARISON:  July 25, 2016 FINDINGS: There is airspace consolidation in the left base, most notably along the posterior aspect. There is a small associated pleural effusion. On the right, there is mild atelectasis. There is a nipple shadow on the right. Right lung otherwise clear. Heart size and pulmonary vascularity are normal. No adenopathy. No pneumothorax. Patient is status post coronary artery bypass grafting. There is degenerative change  in the thoracic spine. IMPRESSION: Persistent airspace consolidation with small left effusion. A degree of pneumonia in the left base is concerning given this appearance. There is mild atelectasis in the right base. Cardiac silhouette is stable. No pneumothorax. Electronically Signed   By: Lowella Grip III M.D.   On: 07/28/2016 07:40   Nm Myocar Multi W/spect W/wall Motion / Ef  Result Date: 07/11/2016 CLINICAL DATA:  70 year old male with new onset chest pressure EXAM: MYOCARDIAL IMAGING WITH SPECT (REST AND PHARMACOLOGIC-STRESS) GATED LEFT VENTRICULAR WALL MOTION STUDY LEFT VENTRICULAR EJECTION FRACTION TECHNIQUE: Standard  myocardial SPECT imaging was performed after resting intravenous injection of 10 mCi Tc-37mtetrofosmin. Subsequently, intravenous infusion of Lexiscan was performed under the supervision of the Cardiology staff. At peak effect of the drug, 30 mCi Tc-971metrofosmin was injected intravenously and standard myocardial SPECT imaging was performed. Quantitative gated imaging was also performed to evaluate left ventricular wall motion, and estimate left ventricular ejection fraction. COMPARISON:  Chest x-ray 07/10/2016 FINDINGS: Perfusion: No decreased activity in the left ventricle on stress imaging to suggest reversible ischemia. There is a region of mildly attenuated radiotracer activity in the inferolateral wall extending to the true apex both at rest and during stress suggesting a region of prior infarct or scarring. Diaphragmatic attenuation artifact is also possible. Wall Motion: Normal left ventricular wall motion. No left ventricular dilation. Left Ventricular Ejection Fraction: 57 % End diastolic volume 47 ml End systolic volume 20 ml IMPRESSION: 1. No evidence of reversible ischemia. Moderate region of fixed defect in the inferolateral wall extending to the true apex suggests an area of prior infarct or scarring versus diaphragmatic attenuation artifact. 2. Normal left ventricular wall motion. 3. Left ventricular ejection fraction 57% 4. Non invasive risk stratification*: Low *2012 Appropriate Use Criteria for Coronary Revascularization Focused Update: J Am Coll Cardiol. 200086;76(1):[email or web address]://[email or web address]?articleid=[email or web address] Electronically Signed   By: HeJacqulynn Cadet.D.   On: 07/11/2016 12:37    Discharge Instructions    Amb Referral to Cardiac Rehabilitation    Complete by:  As directed   Diagnosis:  CABG   CABG X ___:  6   Ambulatory referral to Nutrition and Diabetic Education    Complete by:  As directed   New onset type 2   For home use only DME Glucometer     Complete by:  As directed   Please make sure glucometer is compatible with strips.      Discharge Medications:   Medication List    STOP taking these medications   amLODipine 5 MG tablet Commonly known as:  NORVASC   isosorbide mononitrate 30 MG 24 hr tablet Commonly known as:  IMDUR   nitroGLYCERIN 0.4 MG SL tablet Commonly known as:  NITROSTAT     TAKE these medications   acetaminophen 325 MG tablet Commonly known as:  TYLENOL Take 2 tablets (650 mg total) by mouth every 4 (four) hours as needed for headache or mild pain.   aspirin 325 MG EC tablet Take 1 tablet (325 mg total) by mouth daily. What changed:  medication strength  how much to take   atorvastatin 80 MG tablet Commonly known as:  LIPITOR Take 1 tablet (80 mg total) by mouth daily at 6 PM. What changed:  medication strength  how much to take   blood glucose meter kit and supplies Kit Dispense based on patient and insurance preference. Use up to four times daily as directed. (FOR ICD-9 250.00, 250.01).   metFORMIN 500 MG tablet Commonly  known as:  GLUCOPHAGE Take 1 tablet (500 mg total) by mouth 2 (two) times daily with a meal.   metoprolol succinate 50 MG 24 hr tablet Commonly known as:  TOPROL-XL Take 1 tablet (50 mg total) by mouth daily. What changed:  medication strength  how much to take   ondansetron 4 MG tablet Commonly known as:  ZOFRAN Take 1 tablet (4 mg total) by mouth every 8 (eight) hours as needed for nausea or vomiting.   sulfamethoxazole-trimethoprim 800-160 MG tablet Commonly known as:  BACTRIM DS,SEPTRA DS Take 1 tablet by mouth every 12 (twelve) hours.   traMADol 50 MG tablet Commonly known as:  ULTRAM Take 1-2 tablets (50-100 mg total) by mouth every 6 (six) hours as needed for moderate pain.     ASK your doctor about these medications   living well with diabetes book Misc 1 each by Does not apply route once. Ask about: Should I take this medication?     The  patient has been discharged on:   1.Beta Blocker:  Yes [  x ]                              No   [   ]                              If No, reason:  2.Ace Inhibitor/ARB: Yes [   ]                                     No  [   x ]                                     If No, reason:Labile BP  3.Statin:   Yes [ x  ]                  No  [   ]                  If No, reason:  4.Ecasa:  Yes  [ x  ]                  No   [   ]                  If No, reason:  Follow Up Appointments: Follow-up Information    Grace Isaac, MD Follow up on 08/05/2016.   Specialty:  Cardiothoracic Surgery Why:  Appointment at 10:15am for a sternal incision check. Will also need 4 week f/u appointment with chest xray.  Contact information: [place] [address] [phone]        Harrison Mons, PA-C .   Specialty:  Family Medicine Why:  follow-up in 4 weeks. Call for appointment Contact information: [place] [address] [phone]           Signed: Terance Hart ContePA-C 08/02/2016, 3:02 PM

## 2016-07-28 NOTE — Progress Notes (Signed)
Pt experienced episode of rapid onset nausea with emesis immediately after BM.  Unfortunately this occurred within 20 mins of taking morning meds.  PRN nausea medicine given IV per order as well as mouth wash.  Pt now resting comfortably in bed with family in room.  PA aware.

## 2016-07-29 LAB — GLUCOSE, CAPILLARY
Glucose-Capillary: 129 mg/dL — ABNORMAL HIGH (ref 65–99)
Glucose-Capillary: 135 mg/dL — ABNORMAL HIGH (ref 65–99)
Glucose-Capillary: 130 mg/dL — ABNORMAL HIGH (ref 65–99)
Glucose-Capillary: 118 mg/dL — ABNORMAL HIGH (ref 65–99)

## 2016-07-29 LAB — URINALYSIS, ROUTINE W REFLEX MICROSCOPIC
Bilirubin Urine: NEGATIVE
GLUCOSE, UA: NEGATIVE mg/dL
HGB URINE DIPSTICK: NEGATIVE
Ketones, ur: NEGATIVE mg/dL
LEUKOCYTES UA: NEGATIVE
Nitrite: NEGATIVE
PH: 6 (ref 5.0–8.0)
Protein, ur: NEGATIVE mg/dL
SPECIFIC GRAVITY, URINE: 1.017 (ref 1.005–1.030)

## 2016-07-29 NOTE — Progress Notes (Signed)
Nutrition Consult/Brief Note  RD consulted for nutrition education regarding diabetes.   Lab Results  Component Value Date   HGBA1C 7.0 (H) 07/21/2016    Pt not feeling well upon RD visit.  Provided "Carbohydrate Counting for People with Diabetes" handout from the Academy of Nutrition and Dietetics (left on patient's tray table).  Body mass index is 28.78 kg/m. Pt meets criteria for Overweight based on current BMI.  Current diet order is Heart Healthy/Carbohydrate Modified, patient is consuming approximately 50-100% of meals at this time.   Labs and medications reviewed.   No further nutrition interventions warranted at this time.  If additional nutrition issues arise, please re-consult RD.  Arthur Holms, RD, LDN Pager #: [phone] After-Hours Pager #: [phone]

## 2016-07-29 NOTE — Progress Notes (Addendum)
      [address]       [place] [address]             [phone]        6 Days Post-Op Procedure(s) (LRB): CORONARY ARTERY BYPASS GRAFTING (CABG), ON PUMP, TIMES SIX, USING LEFT INTERNAL MAMMARY ARTERY, RIGHT GREATER SAPHENOUS VEIN HARVESTED ENDOSCOPICALLY (N/A) TRANSESOPHAGEAL ECHOCARDIOGRAM (TEE) (N/A)  Subjective: Patient no longer with nausea. He is eating without problems.  Objective: Vital signs in last 24 hours: Temp:  [97.9 F (36.6 C)-98.4 F (36.9 C)] 98.4 F (36.9 C) (08/24 0441) Pulse Rate:  [64-73] 64 (08/24 0441) Cardiac Rhythm: Normal sinus rhythm (08/23 2000) Resp:  [18] 18 (08/24 0441) BP: (112-140)/(54-64) 112/54 (08/24 0441) SpO2:  [98 %-100 %] 98 % (08/24 0441) Weight:  [194 lb 14.2 oz (88.4 kg)] 194 lb 14.2 oz (88.4 kg) (08/24 0441)  Pre op weight 93 kg Current Weight  07/29/16 194 lb 14.2 oz (88.4 kg)       Intake/Output from previous day: 08/23 0701 - 08/24 0700 In: 960 [P.O.:960] Out: 1101 [Urine:1100; Stool:1]   Physical Exam:  Cardiovascular: RRR Pulmonary: Clear to auscultation on right and slightly diminished left base Abdomen: Soft, non tender, bowel sounds present. Extremities: No lower extremity edema. Wounds: Sternal wound and right lower extremity wounds are clean and dry.  No erythema or signs of infection.  Lab Results: CBC: No results for input(s): WBC, HGB, HCT, PLT in the last 72 hours. BMET:   Recent Labs  07/27/16 0243  NA 137  K 3.3*  CL 102  CO2 26  GLUCOSE 136*  BUN 17  CREATININE 1.22  CALCIUM 8.1*    PT/INR:  Lab Results  Component Value Date   INR 1.44 07/23/2016   INR 1.09 07/21/2016   INR 1.04 07/11/2016   ABG:  INR: Will add last result for INR, ABG once components are confirmed Will add last 4 CBG results once components are confirmed  Assessment/Plan:  1. CV - A fib with RVR previously. Remains in SR. Amiodarone stopped yesterday secondary to nausea and emesis. On  Lopressor 25 mg bid. . 2.  Pulmonary - On room air.  Encourage incentive spirometer. 3. Volume Overload - On Lasix 40 mg daily. 4.  Acute blood loss anemia - H and H stable at 10.2 and 31.4 5. Newly diagnosed DM-CBGs 177/123/129 . Pre op HGA1C 7. Continue Metformin. Will ask diabetes coordinator to evaluate. Will also need to have close follow up with medical doctor after discharge. 6.Will likely discharge later today  ZIMMERMAN,DONIELLE MPA-C 07/29/2016,7:25 AM    Chart reviewed, patient examined, agree with above. Nausea resolved off amiodarone. He is maintaining sinus so far. He started draining some serosanguinous fluid from the lower end of the sternotomy incision. It is a relatively small amount. The sternum feels stable. We decided to keep him today to observe this.

## 2016-07-29 NOTE — Progress Notes (Signed)
Pt has bloody drainage on pad in bed and in his briefs. He seems to think that he has leaked urine and it appears to be bloody.  Lars Pinks notified, received verbal order for UA.

## 2016-07-29 NOTE — Progress Notes (Addendum)
Nurse informed me earlier that patient had blood in urine. UA done which was negative. In actuality, there was sero sanguinous ooze from lower sternal incision. On my first exam of the sternal incision, there was minor sero sanguinous ooze from lower sternal incision (superifical skin edge separation);however, upon examination again, there was a fair amount of sero sanguinous ooze from this lower sternal incision. There is NO cellulitis. He has had no fever and last WBC was decreased (but elevated). And with intermittent nausea, no need for antibiotic at this time. Continue to monitor.

## 2016-07-29 NOTE — Progress Notes (Signed)
CARDIAC REHAB PHASE I   PRE:  Rate/Rhythm: 73 SR    BP: sitting 140/64    SaO2: 97 RA  MODE:  Ambulation: 500 ft   POST:  Rate/Rhythm: 85 SR    BP: sitting 140/70     SaO2: 97 RA  Tolerated well, no c/o this pm. Had walked earlier with wife. To straight back chair after walk, VSS.  [id number]   Darrick Meigs CES, ACSM 07/29/2016 2:43 PM

## 2016-07-29 NOTE — Care Management Important Message (Signed)
Important Message  Patient Details  Name: Lance Stevens MRN: [id number] Date of Birth: [birth year]   Medicare Important Message Given:  Yes    Nathen May 07/29/2016, 11:27 AM

## 2016-07-29 NOTE — Progress Notes (Signed)
Pt midsternal incision cleansed, noted serosanguineous drainage coming from bottom part of incision.   It seems that the drainage that pt identified as bloody urine earlier this morning is actually drainage from incision.  Gauze dressing applied.

## 2016-07-30 LAB — GLUCOSE, CAPILLARY
Glucose-Capillary: 116 mg/dL — ABNORMAL HIGH (ref 65–99)
Glucose-Capillary: 126 mg/dL — ABNORMAL HIGH (ref 65–99)
Glucose-Capillary: 171 mg/dL — ABNORMAL HIGH (ref 65–99)
Glucose-Capillary: 121 mg/dL — ABNORMAL HIGH (ref 65–99)

## 2016-07-30 LAB — CBC
HCT: 34.7 % — ABNORMAL LOW (ref 39.0–52.0)
Hemoglobin: 11.3 g/dL — ABNORMAL LOW (ref 13.0–17.0)
MCH: 30.7 pg (ref 26.0–34.0)
MCHC: 32.6 g/dL (ref 30.0–36.0)
MCV: 94.3 fL (ref 78.0–100.0)
Platelets: 445 K/uL — ABNORMAL HIGH (ref 150–400)
RBC: 3.68 MIL/uL — ABNORMAL LOW (ref 4.22–5.81)
RDW: 13 % (ref 11.5–15.5)
WBC: 12 K/uL — ABNORMAL HIGH (ref 4.0–10.5)

## 2016-07-30 MED ORDER — TRAMADOL HCL 50 MG PO TABS: 50.0000 mg | ORAL_TABLET | Freq: Four times a day (QID) | ORAL | 0 refills | Status: DC | PRN

## 2016-07-30 NOTE — Progress Notes (Addendum)
      [address]       [place] [address]             [phone]        7 Days Post-Op Procedure(s) (LRB): CORONARY ARTERY BYPASS GRAFTING (CABG), ON PUMP, TIMES SIX, USING LEFT INTERNAL MAMMARY ARTERY, RIGHT GREATER SAPHENOUS VEIN HARVESTED ENDOSCOPICALLY (N/A) TRANSESOPHAGEAL ECHOCARDIOGRAM (TEE) (N/A)  Subjective: Patient feeling ok this am. He really wants to go home.  Objective: Vital signs in last 24 hours: Temp:  [97.6 F (36.4 C)-98.6 F (37 C)] 98.6 F (37 C) (08/25 0456) Pulse Rate:  [62-75] 62 (08/25 0456) Cardiac Rhythm: Normal sinus rhythm (08/24 1945) Resp:  [18-19] 18 (08/25 0456) BP: (121-128)/(56-64) 128/64 (08/25 0456) SpO2:  [96 %-98 %] 96 % (08/25 0456) Weight:  [192 lb 14.4 oz (87.5 kg)] 192 lb 14.4 oz (87.5 kg) (08/25 0456)  Pre op weight 93 kg Current Weight  07/30/16 192 lb 14.4 oz (87.5 kg)       Intake/Output from previous day: 08/24 0701 - 08/25 0700 In: 91 [P.O.:542] Out: -    Physical Exam:  Cardiovascular: RRR Pulmonary: Mostly clear Abdomen: Soft, non tender, bowel sounds present. Extremities: No lower extremity edema. Wounds: Right lower extremity wound is clean and dry.  No erythema or signs of infection. Lower sternal wound has superficial opening and is draining a fair amount of sero sanguinous drainage. NO cellulitis. Sternum appears stable.  Lab Results: CBC:  Recent Labs  07/30/16 0244  WBC 12.0*  HGB 11.3*  HCT 34.7*  PLT 445*   BMET:  No results for input(s): NA, K, CL, CO2, GLUCOSE, BUN, CREATININE, CALCIUM in the last 72 hours.  PT/INR:  Lab Results  Component Value Date   INR 1.44 07/23/2016   INR 1.09 07/21/2016   INR 1.04 07/11/2016   ABG:  INR: Will add last result for INR, ABG once components are confirmed Will add last 4 CBG results once components are confirmed  Assessment/Plan:  1. CV - A fib with RVR previously. Remaining in SR On Lopressor 25 mg bid. . 2.  Pulmonary  - On room air.  Encourage incentive spirometer. 3. Volume Overload - On Lasix 40 mg daily. Will stop after today's dose 4.  Acute blood loss anemia - H and H stable at 11.3 and 34.7 5. Newly diagnosed DM-CBGs 130/118/116 . Pre op HGA1C 7. Continue Metformin. Will also need to have close follow up with medical doctor after discharge. 6.Will have to remain in hospital for now  [facility] MPA-C 07/30/2016,7:22 AM  There is still some serous drainage from the lower sternal incision. I suspect it is some old pericardial fluid. The incision is intact and the sternum is stable. No sign of infection. WBC 12 and decreasing.

## 2016-07-30 NOTE — Progress Notes (Signed)
CARDIAC REHAB PHASE I   PRE:  Rate/Rhythm: 75 SR    BP: sitting 122/62    SaO2: 96 RA  MODE:  Ambulation: 700 ft   POST:  Rate/Rhythm: 83 SR    BP: sitting 126/60     SaO2: 100 RA  Tolerated well, no c/o. Encouraged more walking and IS. Will ed with wife tomorrow. [place], ACSM 07/30/2016 3:08 PM

## 2016-07-31 LAB — CREATININE, SERUM
Creatinine, Ser: 1.48 mg/dL — ABNORMAL HIGH (ref 0.61–1.24)
GFR, EST AFRICAN AMERICAN: 54 mL/min — AB (ref >60)
GFR, EST NON AFRICAN AMERICAN: 47 mL/min — AB (ref >60)

## 2016-07-31 LAB — GLUCOSE, CAPILLARY
Glucose-Capillary: 129 mg/dL — ABNORMAL HIGH (ref 65–99)
Glucose-Capillary: 141 mg/dL — ABNORMAL HIGH (ref 65–99)
Glucose-Capillary: 160 mg/dL — ABNORMAL HIGH (ref 65–99)
Glucose-Capillary: 136 mg/dL — ABNORMAL HIGH (ref 65–99)

## 2016-07-31 NOTE — Progress Notes (Signed)
CARDIAC REHAB PHASE I   PRE:  Rate/Rhythm: Sinus 73  BP:  Supine: 124/60       SaO2: 97% room air  MODE:  Ambulation: 750 ft   POST:  Rate/Rhythem: 84  BP:    Sitting: 138/60     SaO2: 99% room air 0930-0950 Patient ambulated in hallway without complaints or difficulty. Tolerated well. Patient assisted back to chair with call light within reach. Will come back to educate patient and wife when she arrives.  Harrell Gave RN BSN

## 2016-07-31 NOTE — Progress Notes (Addendum)
      [address]       [place] [address]             [phone]      8 Days Post-Op Procedure(s) (LRB): CORONARY ARTERY BYPASS GRAFTING (CABG), ON PUMP, TIMES SIX, USING LEFT INTERNAL MAMMARY ARTERY, RIGHT GREATER SAPHENOUS VEIN HARVESTED ENDOSCOPICALLY (N/A) TRANSESOPHAGEAL ECHOCARDIOGRAM (TEE) (N/A)   Subjective:  [name] has no new complaints.  Glad to be off Lasix.  Wants to go home, but understands the reasons not to rush discharge.  Objective: Vital signs in last 24 hours: Temp:  [97.6 F (36.4 C)-98.7 F (37.1 C)] 97.6 F (36.4 C) (08/26 0500) Pulse Rate:  [67-73] 67 (08/26 0500) Cardiac Rhythm: Normal sinus rhythm;Bundle branch block (08/26 0713) Resp:  [17] 17 (08/26 0500) BP: (111-126)/(60-75) 122/66 (08/26 0500) SpO2:  [96 %-100 %] 100 % (08/26 0500) Weight:  [191 lb 11.2 oz (87 kg)] 191 lb 11.2 oz (87 kg) (08/26 0500)  Intake/Output from previous day: 08/25 0701 - 08/26 0700 In: 240 [P.O.:240] Out: -   General appearance: alert, cooperative and no distress Heart: regular rate and rhythm Lungs: clear to auscultation bilaterally Abdomen: soft, non-tender; bowel sounds normal; no masses,  no organomegaly Extremities: edema none appreciated Wound: EVH sites clean and dry... Sternotomy clean, continued serous drainage present  Lab Results:  Recent Labs  07/30/16 0244  WBC 12.0*  HGB 11.3*  HCT 34.7*  PLT 445*   BMET:  Recent Labs  07/31/16 0412  CREATININE 1.48*    PT/INR: No results for input(s): LABPROT, INR in the last 72 hours. ABG    Component Value Date/Time   PHART 7.403 07/23/2016 2006   HCO3 24.9 (H) 07/23/2016 2006   TCO2 26 07/24/2016 1637   ACIDBASEDEF 1.0 07/23/2016 1436   O2SAT 99.0 07/23/2016 2006   CBG (last 3)   Recent Labs  07/30/16 1636 07/30/16 2135 07/31/16 0649  GLUCAP 171* 121* 129*    Assessment/Plan: S/P Procedure(s) (LRB): CORONARY ARTERY BYPASS GRAFTING (CABG), ON PUMP, TIMES SIX,  USING LEFT INTERNAL MAMMARY ARTERY, RIGHT GREATER SAPHENOUS VEIN HARVESTED ENDOSCOPICALLY (N/A) TRANSESOPHAGEAL ECHOCARDIOGRAM (TEE) (N/A)  1. CV- NSR, continue Lopressor 2. Pulm- no acute issues, continue IS 3. Renal- creatinine up to 1.48, off Lasix... Will repeat BMET in AM 4. DM- cbgs controlled, on low dose Metformin... Will require follow up with PCP 5. Dispo- patient stable, sternotomy continues to have some serous drainage present... However this is decreasing... Will repeat BMET in AM... Possibly d/c tomorrow if remains stable   LOS: 8 days    Ellwood Handler 07/31/2016  Chart reviewed, patient examined, agree with above. There is a small amount of serosanguinous drainage on the dressing this afternoon but the incision looks ok with no erythema. Skin edges are together. Continue observation until it stops draining.

## 2016-07-31 NOTE — Progress Notes (Signed)
[id number] Reviewed discharge [facility] education with patient. [name] was upset that I missed his wife. Sternal precautions, exercise instructions and heart healthy diabetic diet reviewed with the patient. I showed [name] how to recall the D/C OHS video so that he and his wife can watch together. [name] is interested in participating in phase 2 cardiac rehab after discharge.Lance Pall, RN,BSN 07/31/2016 1:51 PM

## 2016-08-01 LAB — GLUCOSE, CAPILLARY
Glucose-Capillary: 116 mg/dL — ABNORMAL HIGH (ref 65–99)
Glucose-Capillary: 142 mg/dL — ABNORMAL HIGH (ref 65–99)
Glucose-Capillary: 129 mg/dL — ABNORMAL HIGH (ref 65–99)
Glucose-Capillary: 132 mg/dL — ABNORMAL HIGH (ref 65–99)

## 2016-08-01 LAB — BASIC METABOLIC PANEL
Anion gap: 7 (ref 5–15)
BUN: 21 mg/dL — AB (ref 6–20)
CO2: 29 mmol/L (ref 22–32)
CREATININE: 1.41 mg/dL — AB (ref 0.61–1.24)
Calcium: 9.5 mg/dL (ref 8.9–10.3)
Chloride: 101 mmol/L (ref 101–111)
GFR calc Af Amer: 57 mL/min — ABNORMAL LOW (ref >60)
GFR, EST NON AFRICAN AMERICAN: 49 mL/min — AB (ref >60)
GLUCOSE: 132 mg/dL — AB (ref 65–99)
POTASSIUM: 5.3 mmol/L — AB (ref 3.5–5.1)
SODIUM: 137 mmol/L (ref 135–145)

## 2016-08-01 MED ORDER — SULFAMETHOXAZOLE-TRIMETHOPRIM 800-160 MG PO TABS
1.0000 | ORAL_TABLET | Freq: Two times a day (BID) | ORAL | Status: DC
  Administered 2016-08-01 – 2016-08-02 (×3): 1 via ORAL
  Filled 2016-08-01 (×3): qty 1

## 2016-08-01 NOTE — Progress Notes (Addendum)
      [address]       [place] [address]             [phone]      9 Days Post-Op Procedure(s) (LRB): CORONARY ARTERY BYPASS GRAFTING (CABG), ON PUMP, TIMES SIX, USING LEFT INTERNAL MAMMARY ARTERY, RIGHT GREATER SAPHENOUS VEIN HARVESTED ENDOSCOPICALLY (N/A) TRANSESOPHAGEAL ECHOCARDIOGRAM (TEE) (N/A)   Subjective:  No complaints.  Wants to go home.  Frustrated over incisional drainage.  Objective: Vital signs in last 24 hours: Temp:  [97.6 F (36.4 C)-98 F (36.7 C)] 98 F (36.7 C) (08/27 0605) Pulse Rate:  [70-76] 70 (08/27 0605) Cardiac Rhythm: Normal sinus rhythm;Bundle branch block (08/27 0715) Resp:  [14-17] 14 (08/27 0605) BP: (114-127)/(56-65) 122/61 (08/27 0605) SpO2:  [99 %-100 %] 99 % (08/27 0605) Weight:  [192 lb 1.6 oz (87.1 kg)] 192 lb 1.6 oz (87.1 kg) (08/27 0605)  Intake/Output from previous day: 08/26 0701 - 08/27 0700 In: 240 [P.O.:240] Out: -   General appearance: alert, cooperative and no distress Heart: regular rate and rhythm Lungs: clear to auscultation bilaterally Abdomen: soft, non-tender; bowel sounds normal; no masses,  no organomegaly Extremities: edema trace Wound: clean, minor separation of skin edges along lower portion of sternotomy, serous drainage remains present... no erythema  Lab Results:  Recent Labs  07/30/16 0244  WBC 12.0*  HGB 11.3*  HCT 34.7*  PLT 445*   BMET:  Recent Labs  07/31/16 0412 08/01/16 0316  NA  --  137  K  --  5.3*  CL  --  101  CO2  --  29  GLUCOSE  --  132*  BUN  --  21*  CREATININE 1.48* 1.41*  CALCIUM  --  9.5    PT/INR: No results for input(s): LABPROT, INR in the last 72 hours. ABG    Component Value Date/Time   PHART 7.403 07/23/2016 2006   HCO3 24.9 (H) 07/23/2016 2006   TCO2 26 07/24/2016 1637   ACIDBASEDEF 1.0 07/23/2016 1436   O2SAT 99.0 07/23/2016 2006   CBG (last 3)   Recent Labs  07/31/16 1629 07/31/16 2042 08/01/16 0629  GLUCAP 160* 136* 116*     Assessment/Plan: S/P Procedure(s) (LRB): CORONARY ARTERY BYPASS GRAFTING (CABG), ON PUMP, TIMES SIX, USING LEFT INTERNAL MAMMARY ARTERY, RIGHT GREATER SAPHENOUS VEIN HARVESTED ENDOSCOPICALLY (N/A) TRANSESOPHAGEAL ECHOCARDIOGRAM (TEE) (N/A)  1. CV- NSR, continue Lopressor 2. Pulm- no acute issues, continue use of IS 3. Renal- creatinine stable at 1.41, no Lasix indicated at this time 4. Sternal drainage- remains afebrile, wound is not erythematous, dressing saturated this morning 5. DM- sugars controlled 6. Dispo- patient stable, sternal wound continues to drain, dressing saturated again this morning, creatinine stable, continue wound observation  LOS: 9 days    BARRETT, ERIN 08/01/2016   Chart reviewed, patient examined, agree with above. There is still some serous drainage from the lower end of the incision but there is no erythema or other sign of infection. Continue dressing changes and will start Bactrim for prophylaxis.

## 2016-08-02 ENCOUNTER — Inpatient Hospital Stay (HOSPITAL_COMMUNITY): Payer: Medicare Other

## 2016-08-02 LAB — CBC
HCT: 33.5 % — ABNORMAL LOW (ref 39.0–52.0)
Hemoglobin: 11 g/dL — ABNORMAL LOW (ref 13.0–17.0)
MCH: 31 pg (ref 26.0–34.0)
MCHC: 32.8 g/dL (ref 30.0–36.0)
MCV: 94.4 fL (ref 78.0–100.0)
PLATELETS: 470 10*3/uL — AB (ref 150–400)
RBC: 3.55 MIL/uL — ABNORMAL LOW (ref 4.22–5.81)
RDW: 13.3 % (ref 11.5–15.5)
WBC: 10.4 10*3/uL (ref 4.0–10.5)

## 2016-08-02 LAB — GLUCOSE, CAPILLARY
Glucose-Capillary: 102 mg/dL — ABNORMAL HIGH (ref 65–99)
Glucose-Capillary: 100 mg/dL — ABNORMAL HIGH (ref 65–99)

## 2016-08-02 MED ORDER — SULFAMETHOXAZOLE-TRIMETHOPRIM 800-160 MG PO TABS: 1.0000 | ORAL_TABLET | Freq: Two times a day (BID) | ORAL | 0 refills | Status: DC

## 2016-08-02 NOTE — Progress Notes (Signed)
Patient ambulated with RN 900 feet on room air tolerated well.O2 saturation after ambulation is 99%.

## 2016-08-02 NOTE — Care Management Important Message (Signed)
Important Message  Patient Details  Name: Lance Stevens MRN: [id number] Date of Birth: September 12, 1946   Medicare Important Message Given:  Yes    Nathen May 08/02/2016, 12:05 PM

## 2016-08-02 NOTE — Progress Notes (Addendum)
[address]       [place] [address]             [phone]      10 Days Post-Op Procedure(s) (LRB): CORONARY ARTERY BYPASS GRAFTING (CABG), ON PUMP, TIMES SIX, USING LEFT INTERNAL MAMMARY ARTERY, RIGHT GREATER SAPHENOUS VEIN HARVESTED ENDOSCOPICALLY (N/A) TRANSESOPHAGEAL ECHOCARDIOGRAM (TEE) (N/A) Subjective: Doing well. Eating breakfast. Says that he had an uneventful weekend.   Objective: Vital signs in last 24 hours: Temp:  [97.7 F (36.5 C)-98.4 F (36.9 C)] 97.8 F (36.6 C) (08/28 0543) Pulse Rate:  [69-78] 72 (08/28 0543) Cardiac Rhythm: Normal sinus rhythm;Bundle branch block (08/27 1940) Resp:  [16-20] 18 (08/28 0543) BP: (112-124)/(56-63) 124/63 (08/28 0543) SpO2:  [96 %-100 %] 99 % (08/28 0543) Weight:  [193 lb 1.6 oz (87.6 kg)] 193 lb 1.6 oz (87.6 kg) (08/28 0543)  Hemodynamic parameters for last 24 hours:    Intake/Output from previous day: No intake/output data recorded. Intake/Output this shift: No intake/output data recorded.  General appearance: alert and cooperative Heart: regular rate and rhythm, S1, S2 normal, no murmur, click, rub or gallop Lungs: clear to auscultation bilaterally Abdomen: soft, non-tender; bowel sounds normal; no masses,  no organomegaly Extremities: no edema, redness or tenderness in the calves or thighs Wound: sternal incision c/d/i except area where open near the lower end of the incision. No erythema, however some serous clear drainage  Lab Results:  Recent Labs  08/02/16 0259  WBC 10.4  HGB 11.0*  HCT 33.5*  PLT 470*   BMET:  Recent Labs  07/31/16 0412 08/01/16 0316  NA  --  137  K  --  5.3*  CL  --  101  CO2  --  29  GLUCOSE  --  132*  BUN  --  21*  CREATININE 1.48* 1.41*  CALCIUM  --  9.5    PT/INR: No results for input(s): LABPROT, INR in the last 72 hours. ABG    Component Value Date/Time   PHART 7.403 07/23/2016 2006   HCO3 24.9 (H) 07/23/2016 2006   TCO2 26 07/24/2016  1637   ACIDBASEDEF 1.0 07/23/2016 1436   O2SAT 99.0 07/23/2016 2006   CBG (last 3)   Recent Labs  08/01/16 1618 08/01/16 2006 08/02/16 0641  GLUCAP 129* 132* 102*    Assessment/Plan: S/P Procedure(s) (LRB): CORONARY ARTERY BYPASS GRAFTING (CABG), ON PUMP, TIMES SIX, USING LEFT INTERNAL MAMMARY ARTERY, RIGHT GREATER SAPHENOUS VEIN HARVESTED ENDOSCOPICALLY (N/A) TRANSESOPHAGEAL ECHOCARDIOGRAM (TEE) (N/A)  1. CV- NSR 70s. Continue Lopressor 2. Pulm- continue IS use. On RA. 3. Renal-creatinine 1.4 yesterday. No BMP today.  4. Sternal drainage-remains afebrile without significant WBC. No evidence of infection. Continues to have signs of drainage on his dressing, but appears dry. Bactrim started over the weekend for prophylaxis and patient is tolerating.  5. DM-blood sugar well controlled on current regimen   Plan: Patient doing well on RA and walking several times a day. Some serous drainage from the lower end of the incision but no erythema or other signs of infection. Continue dressing changes and antibiotics. No recent nausea.Home once okay with attending.     LOS: 10 days    Lance Stevens 08/02/2016  Patient seen and wound examined, sternum stable , no drainage today since dressing change early this am  even with cough Will check chest xray today before d/c  Then home today and wound check in office Thursday  I have seen and  examined Lance Stevens and agree with the above assessment  and plan.  Grace Isaac MD Beeper [phone] Office [phone] 08/02/2016 12:00 PM

## 2016-08-02 NOTE — Care Management Note (Signed)
Case Management Note Marvetta Gibbons RN, BSN Unit 2W-Case Manager [phone]  Patient Details  Name: NUH MEEDS MRN: [id number] Date of Birth: [birth year]  Subjective/Objective:   Pt s/p CABG x6                 Action/Plan: PTA pt lived at home with spouse- plan to return home- no anticipated needs.   Expected Discharge Date:     08/02/16             Expected Discharge Plan:  Home/Self Care  In-House Referral:     Discharge planning Services  CM Consult  Post Acute Care Choice:  NA Choice offered to:     DME Arranged:    DME Agency:     HH Arranged:    HH Agency:     Status of Service:  Completed, signed off  If discussed at [employer] of Stay Meetings, dates discussed:    Additional Comments:  Dawayne Patricia, RN 08/02/2016, 3:41 PM

## 2016-08-02 NOTE — Progress Notes (Signed)
CARDIAC REHAB PHASE I   PRE:  Rate/Rhythm: 81 SR  BP:  Supine:   Sitting: 120/70  Standing:    SaO2: 98%RA  MODE:  Ambulation: 1000 ft   POST:  Rate/Rhythm: 88  BP:  Supine:   Sitting: 142/62  Standing:    SaO2: 100%RA 0853-0920 Pt walked 1000 ft with steady gait. Tolerated well. No complaints.   Graylon Good, RN BSN  08/02/2016 9:16 AM

## 2016-08-02 NOTE — Discharge Instructions (Signed)
Coronary Artery Bypass Grafting, Care After °Refer to this sheet in the next few weeks. These instructions provide you with information on caring for yourself after your procedure. Your health care provider may also give you more specific instructions. Your treatment has been planned according to current medical practices, but problems sometimes occur. Call your health care provider if you have any problems or questions after your procedure. °WHAT TO EXPECT AFTER THE PROCEDURE °Recovery from surgery will be different for everyone. Some people feel well after 3 or 4 weeks, while for others it takes longer. After your procedure, it is typical to have the following: °· Nausea and a lack of appetite.   °· Constipation. °· Weakness and fatigue.   °· Depression or irritability.   °· Pain or discomfort at your incision site. °HOME CARE INSTRUCTIONS °· Take medicines only as directed by your health care provider. Do not stop taking medicines or start any new medicines without first checking with your health care provider. °· Take your pulse as directed by your health care provider. °· Perform deep breathing as directed by your health care provider. If you were given a device called an incentive spirometer, use it to practice deep breathing several times a day. Support your chest with a pillow or your arms when you take deep breaths or cough. °· Keep incision areas clean, dry, and protected. Remove or change any bandages (dressings) only as directed by your health care provider. You may have skin adhesive strips over the incision areas. Do not take the strips off. They will fall off on their own. °· Check incision areas daily for any swelling, redness, or drainage. °· If incisions were made in your legs, do the following: °¨ Avoid crossing your legs.   °¨ Avoid sitting for long periods of time. Change positions every 30 minutes.   °¨ Elevate your legs when you are sitting. °· Wear compression stockings as directed by your  health care provider. These stockings help keep blood clots from forming in your legs. °· Take showers once your health care provider approves. Until then, only take sponge baths. Pat incisions dry. Do not rub incisions with a washcloth or towel. Do not take baths, swim, or use a hot tub until your health care provider approves. °· Eat foods that are high in fiber, such as raw fruits and vegetables, whole grains, beans, and nuts. Meats should be lean cut. Avoid canned, processed, and fried foods. °· Drink enough fluid to keep your urine clear or pale yellow. °· Weigh yourself every day. This helps identify if you are retaining fluid that may make your heart and lungs work harder. °· Rest and limit activity as directed by your health care provider. You may be instructed to: °¨ Stop any activity at once if you have chest pain, shortness of breath, irregular heartbeats, or dizziness. Get help right away if you have any of these symptoms. °¨ Move around frequently for short periods or take short walks as directed by your health care provider. Increase your activities gradually. You may need physical therapy or cardiac rehabilitation to help strengthen your muscles and build your endurance. °¨ Avoid lifting, pushing, or pulling anything heavier than 10 lb (4.5 kg) for at least 6 weeks after surgery. °· Do not drive until your health care provider approves.  °· Ask your health care provider when you may return to work. °· Ask your health care provider when you may resume sexual activity. °· Keep all follow-up visits as directed by your health care   provider. This is important. °SEEK MEDICAL CARE IF: °· You have swelling, redness, increasing pain, or drainage at the site of an incision. °· You have a fever. °· You have swelling in your ankles or legs. °· You have pain in your legs.   °· You gain 2 or more pounds (0.9 kg) a day. °· You are nauseous or vomit. °· You have diarrhea.  °SEEK IMMEDIATE MEDICAL CARE IF: °· You have  chest pain that goes to your jaw or arms. °· You have shortness of breath.   °· You have a fast or irregular heartbeat.   °· You notice a "clicking" in your breastbone (sternum) when you move.   °· You have numbness or weakness in your arms or legs. °· You feel dizzy or light-headed.   °MAKE SURE YOU: °· Understand these instructions. °· Will watch your condition. °· Will get help right away if you are not doing well or get worse. °  °This information is not intended to replace advice given to you by your health care provider. Make sure you discuss any questions you have with your health care provider. °  °Document Released: 06/11/2005 Document Revised: 12/13/2014 Document Reviewed: 05/01/2013 °Elsevier Interactive Patient Education ©2016 [employer] ° °

## 2016-08-02 NOTE — Progress Notes (Signed)
08/02/2016 4:52 PM Discharge AVS meds taken today and those due this evening reviewed.  Follow-up appointments and when to call md reviewed.  D/C IV and TELE.  Questions and concerns addressed.   D/C home per orders. [place]

## 2016-08-03 ENCOUNTER — Ambulatory Visit (INDEPENDENT_AMBULATORY_CARE_PROVIDER_SITE_OTHER): Payer: Medicare Other | Admitting: Physician Assistant

## 2016-08-03 ENCOUNTER — Encounter: Payer: Self-pay | Admitting: Physician Assistant

## 2016-08-03 VITALS — BP 133/73 | HR 85 | Temp 98.7°F | Resp 18 | Ht 69.0 in | Wt 195.2 lb

## 2016-08-03 DIAGNOSIS — C61 Malignant neoplasm of prostate: Secondary | ICD-10-CM

## 2016-08-03 DIAGNOSIS — E119 Type 2 diabetes mellitus without complications: Secondary | ICD-10-CM

## 2016-08-03 DIAGNOSIS — I2510 Atherosclerotic heart disease of native coronary artery without angina pectoris: Secondary | ICD-10-CM

## 2016-08-03 DIAGNOSIS — I10 Essential (primary) hypertension: Secondary | ICD-10-CM

## 2016-08-03 DIAGNOSIS — I2583 Coronary atherosclerosis due to lipid rich plaque: Secondary | ICD-10-CM

## 2016-08-03 NOTE — Progress Notes (Signed)
Patient ID: Lance Stevens, male    DOB: 1946/02/17, 70 y.o.   MRN: [id number]  PCP: Harrison Mons, PA-C  Subjective:   Chief Complaint  Patient presents with  . Follow-up    medications- diabetes/new diagnosis    HPI Presents for Hospital Follow-up, although this appointment was previously scheduled as a routine follow-up. He is accompanied by his wife.  I last saw Lance Stevens 12/09/2015 to establish care and for Wellness Evaluation, as his PCP, [name], is preparing to retire. He had been diagnosed with diabetes in 05/2015 and was working hard on healthy lifestyle changes. PSA was performed as routine screening (he had not had a level documented in the EMR). Unfortunately, it was found to be elevated at 11.77 and he was referred to urology.  During the evaluation of prostate cancer, he fell and fractured the LEFT 4th metacarpal.  He underwent a robotic assisted laparoscopic radical prostatectomy on 04/05/2016 and was doing well until he developed chest pain and was admitted through the ED on 07/10/2016. Stress test revealed a fixed defect and ST depressions on exertion. Cath revealed significant CAD in the mid and distal RCA/PLA, ostial and proximal LAD. Medical management was recommended and he was started on Toprol and statin therapy. ASA and amlodipine were continued upon discharge 07/12/16.  He presented again to the ED on 8/12 with recurrent chest pain. He was readmitted and evaluated for CABG. As his chest pain resolved with IV NTG and he ruled out for AMI, he was discharged home 8/14 with plans to return for the surgery later in the week. On 8/18 he was admitted by [name] for multivessel CABG. Ultimately, he had a 6V CABG. During the hospital course, he developed AFib with RVR and was medically converted to NSR.  His hospitalization was extended due to some serosanguinous drainage from the inferior pole of the sternal incision and was discharged 08/02/2016 (yesterday). He will follow-up  with [name] on 08/05/2016, and plans to start cardiac rehab.  Other than being tired, he is feeling well.  No CP, SOB. No nausea or vomiting. He is tolerating all the medications well, without adverse effects.  A1C 7.0% on 07/21/2016. Hgb 11.0/Hct 33.5 on 08/02/16   Review of Systems As above.    Patient Active Problem List   Diagnosis Date Noted  . Atrial fibrillation with rapid ventricular response ([place]) 07/26/2016  . S/P CABG (coronary artery bypass graft) 07/23/2016  . Essential hypertension 07/17/2016  . CKD (chronic kidney disease), stage II 07/12/2016  . Dyslipidemia 07/12/2016  . CAD (coronary artery disease) 07/12/2016  . Prostate cancer (Lance [place]) 04/05/2016  . 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 Elevated PSA 12/11/2015  . Hypertriglyceridemia 12/11/2015  . Diabetes mellitus type 2 in nonobese (Lance Stevens) 05/27/2015  . Colon cancer screening 05/27/2015  . Need for shingles vaccine 05/27/2015  . Family history of Parkinson's disease 05/27/2015  . History of nephrolithiasis 05/27/2015     Prior to Admission medications   Medication Sig Start Date End Date Taking? Authorizing Provider  aspirin EC 325 MG EC tablet Take 1 tablet (325 mg total) by mouth daily. 07/28/16  Yes Donielle Liston Alba, PA-C  atorvastatin (LIPITOR) 80 MG tablet Take 1 tablet (80 mg total) by mouth daily at 6 PM. 07/28/16  Yes Donielle Liston Alba, PA-C  metFORMIN (GLUCOPHAGE) 500 MG tablet Take 1 tablet (500  mg total) by mouth 2 (two) times daily with a meal. 07/28/16  Yes Donielle Liston Alba, PA-C  metoprolol succinate (TOPROL-XL) 50 MG 24 hr tablet Take 1 tablet (50 mg total) by mouth daily. 07/28/16  Yes Donielle Liston Alba, PA-C  sulfamethoxazole-trimethoprim (BACTRIM DS,SEPTRA DS) 800-160 MG tablet Take 1 tablet by mouth every 12 (twelve) hours. 08/02/16 08/15/16 Yes Elgie Collard, PA-C  acetaminophen  (TYLENOL) 325 MG tablet Take 2 tablets (650 mg total) by mouth every 4 (four) hours as needed for headache or mild pain. Patient not taking: Reported on 08/03/2016 07/19/16   Erlene Quan, PA-C  blood glucose meter kit and supplies KIT Dispense based on patient and insurance preference. Use up to four times daily as directed. (FOR ICD-9 250.00, 250.01). 07/28/16   Donielle Liston Alba, PA-C  ondansetron (ZOFRAN) 4 MG tablet Take 1 tablet (4 mg total) by mouth every 8 (eight) hours as needed for nausea or vomiting. Patient not taking: Reported on 08/03/2016 07/28/16   Nani Skillern, PA-C  traMADol (ULTRAM) 50 MG tablet Take 1-2 tablets (50-100 mg total) by mouth every 6 (six) hours as needed for moderate pain. Patient not taking: Reported on 08/03/2016 07/30/16   Nani Skillern, PA-C     Allergies  Allergen Reactions  . Penicillins Swelling and Other (See Comments)    As a child. Has patient had a PCN reaction causing immediate rash, facial/tongue/throat swelling, SOB or lightheadedness with hypotension: Yes Has patient had a PCN reaction causing severe rash involving mucus membranes or skin necrosis: No Has patient had a PCN reaction that required hospitalization: Was already in hospital when reaction happened Has patient had a PCN reaction occurring within the last 10 years: No  If all of the above answers are "NO", then may proceed with Cephalosporin use.        Objective:  Physical Exam  Constitutional: He is oriented to person, place, and time. He appears well-developed and well-nourished. He is active and cooperative. No distress.  BP 133/73 (Patient Position: Sitting, Cuff Size: Normal) Comment (BP Location): right arm  Pulse 85   Temp 98.7 F (37.1 C) (Oral)   Resp 18   Ht '5\' 9"'  (1.753 m)   Wt 195 lb 3.2 oz (88.5 kg)   BMI 28.83 kg/m    HENT:  Head: Normocephalic and atraumatic.  Right Ear: Hearing normal.  Left Ear: Hearing normal.  Eyes: Conjunctivae are  normal. No scleral icterus.  Neck: Normal range of motion. Neck supple. No thyromegaly present.  Cardiovascular: Normal rate, regular rhythm and normal heart sounds.   Pulses:      Radial pulses are 2+ on the right side, and 2+ on the left side.  Pulmonary/Chest: Effort normal and breath sounds normal.  Lymphadenopathy:       Head (right side): No tonsillar, no preauricular, no posterior auricular and no occipital adenopathy present.       Head (left side): No tonsillar, no preauricular, no posterior auricular and no occipital adenopathy present.    He has no cervical adenopathy.       Right: No supraclavicular adenopathy present.       Left: No supraclavicular adenopathy present.  Neurological: He is alert and oriented to person, place, and time. No sensory deficit.  Skin: Skin is warm, dry and intact. No rash noted. No cyanosis or erythema. Nails show no clubbing.  Clean dressing covers the sternal incision.  Psychiatric: He has a normal mood and affect. His speech  is normal and behavior is normal.    Diabetic Foot Exam - Simple   Simple Foot Form Diabetic Foot exam was performed with the following findings:  Yes 08/03/2016  1:30 PM  Visual Inspection No deformities, no ulcerations, no other skin breakdown bilaterally:  Yes Sensation Testing Pulse Check Posterior Tibialis and Dorsalis pulse intact bilaterally:  Yes Comments 2nd right toe limited sensation and heel area           Assessment & Plan:   1. Diabetes mellitus type 2 in nonobese (HCC) Controlled. Refer for education.  - Ambulatory referral to diabetic education  2. Prostate cancer [facility]) S/p prostatectomy. Follow-up with urology per Dr. Lynne Logan recommendation.  3. Coronary artery disease due to lipid rich plaque S/p 6V CABG. Follow up with Drs. Servando Snare and [place] per their recommendations.  4. Essential hypertension Controlled. Continuee current treatment and follow-up with Dr. Stanford Breed.  He declined  influenza vaccine today, but agrees to get it before 11/01.  Follow-up with me in 2-3 months.   Fara Chute, PA-C Physician Assistant-Certified [facility]

## 2016-08-03 NOTE — Patient Instructions (Signed)
     IF you received an x-ray today, you will receive an invoice from [facility]. Please contact [facility] at [phone] with questions or concerns regarding your invoice.   IF you received labwork today, you will receive an invoice from [facility]. Please contact [facility] at [phone] with questions or concerns regarding your invoice.   Our billing staff will not be able to assist you with questions regarding bills from these companies.  You will be contacted with the lab results as soon as they are available. The fastest way to get your results is to activate your My Chart account. Instructions are located on the last page of this paperwork. If you have not heard from us regarding the results in 2 weeks, please contact this office.      

## 2016-08-04 ENCOUNTER — Ambulatory Visit: Payer: Medicare Other | Admitting: Physician Assistant

## 2016-08-04 ENCOUNTER — Other Ambulatory Visit: Payer: Self-pay | Admitting: Cardiothoracic Surgery

## 2016-08-04 DIAGNOSIS — Z951 Presence of aortocoronary bypass graft: Secondary | ICD-10-CM

## 2016-08-05 ENCOUNTER — Ambulatory Visit (INDEPENDENT_AMBULATORY_CARE_PROVIDER_SITE_OTHER): Payer: Self-pay | Admitting: Cardiothoracic Surgery

## 2016-08-05 ENCOUNTER — Ambulatory Visit: Payer: Medicare Other

## 2016-08-05 ENCOUNTER — Telehealth: Payer: Self-pay | Admitting: Cardiology

## 2016-08-05 ENCOUNTER — Encounter: Payer: Self-pay | Admitting: Cardiothoracic Surgery

## 2016-08-05 ENCOUNTER — Ambulatory Visit
Admission: RE | Admit: 2016-08-05 | Discharge: 2016-08-05 | Disposition: A | Discharge status: Home / Self Care | Payer: Medicare Other | Source: Ambulatory Visit | Attending: Cardiothoracic Surgery | Admitting: Cardiothoracic Surgery

## 2016-08-05 VITALS — BP 119/73 | HR 80 | Resp 20 | Ht 69.0 in | Wt 195.0 lb

## 2016-08-05 DIAGNOSIS — Z951 Presence of aortocoronary bypass graft: Secondary | ICD-10-CM

## 2016-08-05 DIAGNOSIS — I2510 Atherosclerotic heart disease of native coronary artery without angina pectoris: Secondary | ICD-10-CM

## 2016-08-05 MED ORDER — METOPROLOL SUCCINATE ER 50 MG PO TB24: 50.0000 mg | ORAL_TABLET | Freq: Every day | ORAL | 1 refills | Status: DC

## 2016-08-05 MED ORDER — BLOOD GLUCOSE MONITOR KIT: PACK | 0 refills | Status: DC

## 2016-08-05 NOTE — Progress Notes (Signed)
Lance Stevens       [place] [address]             [phone]      Terald R [facility] [place] Medical Record #[id number] Date of Birth: Mar 11, 1946  Referring: Arnoldo Lenis, MD Primary Care: Harrison Mons, PA-C  Chief Complaint:   POST OP FOLLOW UP 07/23/2016  OPERATIVE REPORT PREOPERATIVE DIAGNOSIS:  Coronary occlusive disease with recurrent crescendo angina. 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 SURGICAL PROCEDURE:  Coronary artery bypass grafting x6 with the left internal mammary to the left anterior descending coronary artery, reversed saphenous vein graft to the diagonal coronary artery, sequential reversed saphenous vein graft to the intermediate and circumflex coronary artery, sequential reversed saphenous vein graft to the distal right coronary artery and distal posterior lateral branch of the right coronary artery, with greater saphenous vein harvesting endoscopically, right thigh and calf. SURGEON:  Lanelle Bal, MD.   History of Present Illness:     Patient following recent coronary artery bypass grafting comes into the office today primarily for a early wound check. He had had some drainage from his lower sternal incision that delay discharge. Since going home he has made good progress, his primary complaint is difficulty sleeping. He never filled his prescription for pain medication he's had no fever or chills. He does fatigue easily.      Past Medical History:  Diagnosis Date  . Allergy   . Anginal pain ([place])   . Borderline diabetes   . Bronchitis    recurrent  . CAD in native artery    a. Multivessel CAD by cath 07/2016 - tx with medical therapy with consideration of CABG for refractory angina.  . Essential hypertension   . Fracture, finger    LEFT HAND WITH SWELLING AND BRUISING  . Heart murmur    ? teen  . History of gout   . History of kidney stones   . History of  kidney stones   . History of skin cancer    possible - pt not sure of dx  . Hyperlipidemia   . Pneumonia    hx  . Prostate cancer [facility] 2)    a. s/p radical prostatectomy 04/2016.  Marland Kitchen Sinus infection    recurrent     History  Smoking Status  . Never Smoker  Smokeless Tobacco  . Never Used    History  Alcohol Use  . 1.2 oz/week  . 2 Standard drinks or equivalent per week    Comment: wine - red 2 glasses wine per week     Allergies  Allergen Reactions  . Penicillins Swelling and Other (See Comments)    As a child. Has patient had a PCN reaction causing immediate rash, facial/tongue/throat swelling, SOB or lightheadedness with hypotension: Yes Has patient had a PCN reaction causing severe rash involving mucus membranes or skin necrosis: No Has patient had a PCN reaction that required hospitalization: Was already in hospital when reaction happened Has patient had a PCN reaction occurring within the last 10 years: No  If all of the above answers are "NO", then may proceed with Cephalosporin use.     Current Outpatient Prescriptions  Medication Sig Dispense Refill  . acetaminophen (TYLENOL) 325 MG tablet Take 2 tablets (650 mg total) by mouth every 4 (four) hours as needed for headache or mild pain.    Marland Kitchen aspirin EC 325 MG EC tablet Take 1 tablet (325 mg  total) by mouth daily. 30 tablet 0  . atorvastatin (LIPITOR) 80 MG tablet Take 1 tablet (80 mg total) by mouth daily at 6 PM. 30 tablet 1  . blood glucose meter kit and supplies KIT Dispense based on patient and insurance preference. Use up to four times daily as directed. (FOR ICD-9 250.00, 250.01). 1 each 0  . metFORMIN (GLUCOPHAGE) 500 MG tablet Take 1 tablet (500 mg total) by mouth 2 (two) times daily with a meal. 60 tablet 1  . metoprolol succinate (TOPROL-XL) 50 MG 24 hr tablet Take 1 tablet (50 mg total) by mouth daily. 30 tablet 1  . sulfamethoxazole-trimethoprim (BACTRIM DS,SEPTRA DS) 800-160 MG tablet Take 1 tablet by  mouth every 12 (twelve) hours. 26 tablet 0  . ondansetron (ZOFRAN) 4 MG tablet Take 1 tablet (4 mg total) by mouth every 8 (eight) hours as needed for nausea or vomiting. (Patient not taking: Reported on 08/03/2016) 14 tablet 0  . traMADol (ULTRAM) 50 MG tablet Take 1-2 tablets (50-100 mg total) by mouth every 6 (six) hours as needed for moderate pain. (Patient not taking: Reported on 08/03/2016) 28 tablet 0   No current facility-administered medications for this visit.        Physical Exam: BP 119/73   Pulse 80   Resp 20   Ht _0  (1.753 m)   Wt 195 lb (88.5 kg)   BMI 28.80 kg/m   General appearance: alert and cooperative Neurologic: intact Heart: regular rate and rhythm, S1, S2 normal, no murmur, click, rub or gallop Lungs: clear to auscultation bilaterally Abdomen: soft, non-tender; bowel sounds normal; no masses,  no organomegaly Extremities: extremities normal, atraumatic, no cyanosis or edema and Homans sign is negative, no sign of DVT Wound: Patient's sternal wound is stable and well-healed there is currently no drainage   Diagnostic Studies & Laboratory data:     Recent Radiology Findings:   Dg Chest 2 View  Result Date: 08/05/2016 CLINICAL DATA:  CABG on July 23, 2016. The patient reports exertional shortness of breath. EXAM: CHEST  2 VIEW COMPARISON:  PA and lateral chest x-ray of August 02, 2016 FINDINGS: The lungs are well-expanded. Minimal linear density at the left lung base persists. A trace of pleural fluid remains at the left base but this has decreased further since the earlier study. The right lung is clear. The heart and pulmonary vascularity are normal. The mediastinum is normal in width. The sternal wires are intact. There is multilevel degenerative disc disease of the thoracic spine. IMPRESSION: Continued interval improvement in the appearance of the left lung base with decreased atelectasis and pleural fluid. No pulmonary edema or pneumothorax. Electronically  Signed   By: David  Martinique M.D.   On: 08/05/2016 09:55      Recent Lab Findings: Lab Results  Component Value Date   WBC 10.4 08/02/2016   HGB 11.0 (L) 08/02/2016   HCT 33.5 (L) 08/02/2016   PLT 470 (H) 08/02/2016   GLUCOSE 132 (H) 08/01/2016   CHOL 196 07/10/2016   TRIG 365 (H) 07/10/2016   HDL 24 (L) 07/10/2016   LDLCALC 99 07/10/2016   ALT 24 07/21/2016   AST 15 07/21/2016   NA 137 08/01/2016   K 5.3 (H) 08/01/2016   CL 101 08/01/2016   CREATININE 1.41 (H) 08/01/2016   BUN 21 (H) 08/01/2016   CO2 29 08/01/2016   INR 1.44 07/23/2016   HGBA1C 7.0 (H) 07/21/2016      Assessment / Plan:  Patient doing well early following coronary artery bypass grafting, without fever or evidence of sternal wound infection Patient will discontinue his Bactrim. He is enrolled in diabetes education class I plan to see him back in 3 weeks and at that time refer to cardiac rehabilitation. He will see cardiology next week to review his medications and dosage.   Grace Isaac MD      [address] [place] [address] Office [phone]   Beeper [phone]  08/05/2016 10:27 AM

## 2016-08-05 NOTE — Addendum Note (Signed)
Addended by: Lanelle Bal B on: 08/05/2016 10:56 AM   Modules accepted: Orders

## 2016-08-05 NOTE — Patient Instructions (Signed)
    [address]       [place] [address]             [phone]       Coronary Artery Bypass Grafting  Care After  Refer to this sheet in the next few weeks. These instructions provide you with information on caring for yourself after your procedure. Your caregiver may also give you more specific instructions. Your treatment has been planned according to current medical practices, but problems sometimes occur. Call your caregiver if you have any problems or questions after your procedure.  Recovery from open heart surgery will be different for everyone. Some people feel well after 3 or 4 weeks, while for others it takes longer. After heart surgery, it may be normal to:  Not have an appetite, feel nauseated by the smell of food, or only want to eat a small amount.   Be constipated because of changes in your diet, activity, and medicines. Eat foods high in fiber. Add fresh fruits and vegetables to your diet. Stool softeners may be helpful.   Feel sad or unhappy. You may be frustrated or cranky. You may have good days and bad days. Do not give up. Talk to your caregiver if you do not feel better.   Feel weakness and fatigue. You many need physical therapy or cardiac rehabilitation to get your strength back.   Develop an irregular heartbeat called atrial fibrillation. Symptoms of atrial fibrillation are a fast, irregular heartbeat or feelings of fluttery heartbeats, shortness of breath, low blood pressure, and dizziness. If these symptoms develop, see your caregiver right away.  MEDICATION  Have a list of all the medicines you will be taking when you leave the hospital. For every medicine, know the following:   Name.   Exact dose.   Time of day to be taken.   How often it should be taken.   Why you are taking it.   Ask which medicines should or should not be taken together. If you take more than one heart medicine, ask if it is okay to take them together. Some  heart medicines should not be taken at the same time because they may lower your blood pressure too much.   Narcotic pain medicine can cause constipation. Eat fresh fruits and vegetables. Add fiber to your diet. Stool softener medicine may help relieve constipation.   Keep a copy of your medicines with you at all times.   Do not add or stop taking any medicine until you check with your caregiver.   Medicines can have side effects. Call your caregiver who prescribed the medicine if you:   Start throwing up, have diarrhea, or have stomach pain.   Feel dizzy or lightheaded when you stand up.   Feel your heart is skipping beats or is beating too fast or too slow.   Develop a rash.   Notice unusual bruising or bleeding.  HOME CARE INSTRUCTIONS  After heart surgery, it is important to learn how to take your pulse. Have your caregiver show you how to take your pulse.   Use your incentive spirometer. Ask your caregiver how long after surgery you need to use it.  Care of your chest incision  Tell your caregiver right away if you notice clicking in your chest (sternum).   Support your chest with a pillow or your arms when you take deep breaths and cough.   Follow your caregiver's instructions about when you can bathe or   swim.   Protect your incision from sunlight during the first year to keep the scar from getting dark.   Tell your caregiver if you notice:   Increased tenderness of your incision.   Increased redness or swelling around your incision.   Drainage or pus from your incision.  Care of your leg incision(s)  Avoid crossing your legs.   Avoid sitting for long periods of time. Change positions every half hour.   Elevate your leg(s) when you are sitting.   Check your leg(s) daily for swelling. Check the incisions for redness or drainage.   Diet is very important to heart health.   Eat plenty of fresh fruits and vegetables. Meats should be lean cut. Avoid canned,  processed, and fried foods.   Talk to a dietician. They can teach you how to make healthy food and drink choices.  Weight  Weigh yourself every day. This is important because it helps to know if you are retaining fluid that may make your heart and lungs work harder.   Use the same scale each time.   Weigh yourself every morning at the same time. You should do this after you go to the bathroom, but before you eat breakfast.   Your weight will be more accurate if you do not wear any clothes.   Record your weight.   Tell your caregiver if you have gained 2 pounds or more overnight.  Activity Stop any activity at once if you have chest pain, shortness of breath, irregular heartbeats, or dizziness. Get help right away if you have any of these symptoms.  Bathing.  Avoid soaking in a bath or hot tub until your incisions are healed.   Rest. You need a balance of rest and activity.   Exercise. Exercise per your caregiver's advice. You may need physical therapy or cardiac rehabilitation to help strengthen your muscles and build your endurance.   Climbing stairs. Unless your caregiver tells you not to climb stairs, go up stairs slowly and rest if you tire. Do not pull yourself up by the handrail.   Driving a car. Follow your caregiver's advice on when you may drive. You may ride as a passenger at any time. When traveling for long periods of time in a car, get out of the car and walk around for a few minutes every 2 hours.   Lifting. Avoid lifting, pushing, or pulling anything heavier than 10 pounds for 6 weeks after surgery or as told by your caregiver.   Returning to work. Check with your caregiver. People heal at different rates. Most people will be able to go back to work 6 to 12 weeks after surgery.   Sexual activity. You may resume sexual relations as told by your caregiver.  SEEK MEDICAL CARE IF:  Any of your incisions are red, painful, or have any type of drainage coming from them.     You have an oral temperature above 101.5 F .   You have ankle or leg swelling.   You have pain in your legs.   You have weight gain of 2 or more pounds a day.   You feel dizzy or lightheaded when you stand up.  SEEK IMMEDIATE MEDICAL CARE IF:  You have angina or chest pain that goes to your jaw or arms. Call your local emergency services right away.   You have shortness of breath at rest or with activity.   You have a fast or irregular heartbeat (arrhythmia).   There is   a "clicking" in your sternum when you move.   You have numbness or weakness in your arms or legs.  MAKE SURE YOU:  Understand these instructions.   Will watch your condition.   Will get help right away if you are not doing well or get worse.    No lifting over 25 lbs for 3 months 

## 2016-08-05 NOTE — Telephone Encounter (Signed)
Initial orders were printed, patient did not received these printed Rxs at discharge. Informed wife I would resubmit electronically to pharmacy of preference. Aware to call if concerns or questions.

## 2016-08-05 NOTE — Telephone Encounter (Signed)
New Message  Pts wife voiced the request for the Blood glucose meter kit & metoprolol succinate has not been received to the [facility]  Please follow up with pts wife. Thanks!

## 2016-08-06 ENCOUNTER — Other Ambulatory Visit: Payer: Self-pay | Admitting: *Deleted

## 2016-08-06 DIAGNOSIS — E119 Type 2 diabetes mellitus without complications: Secondary | ICD-10-CM

## 2016-08-06 MED ORDER — BLOOD GLUCOSE MONITOR KIT: PACK | 0 refills | Status: AC

## 2016-08-10 IMAGING — CR DG HAND COMPLETE 3+V*L*
3 series · 3 of 3 positions shown · non-contrast
Comparison: No priors.

CLINICAL DATA: 70-year-old male with history of trauma from a fall
onto concrete yesterday ago pain in the left hand and, most severe
in the proximal left fourth digit.

EXAM:
LEFT HAND - COMPLETE 3+ VIEW

[x hand pa left]
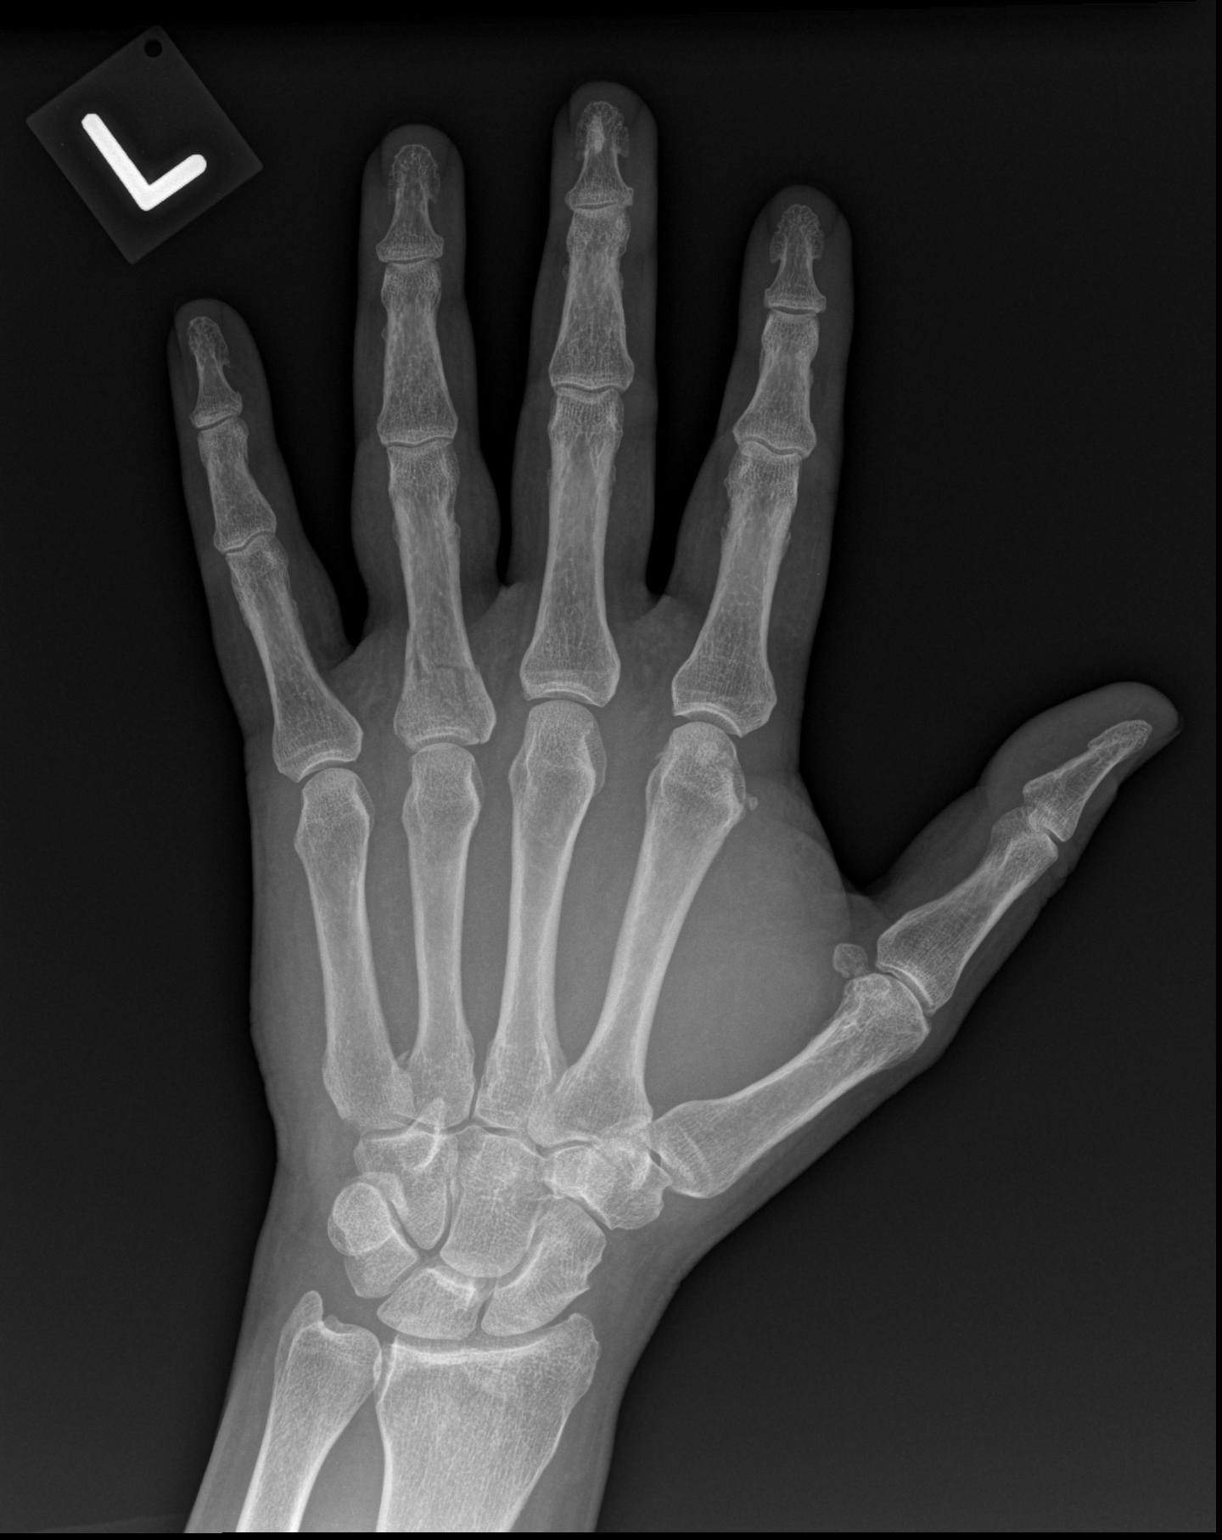

[x hand obl left]
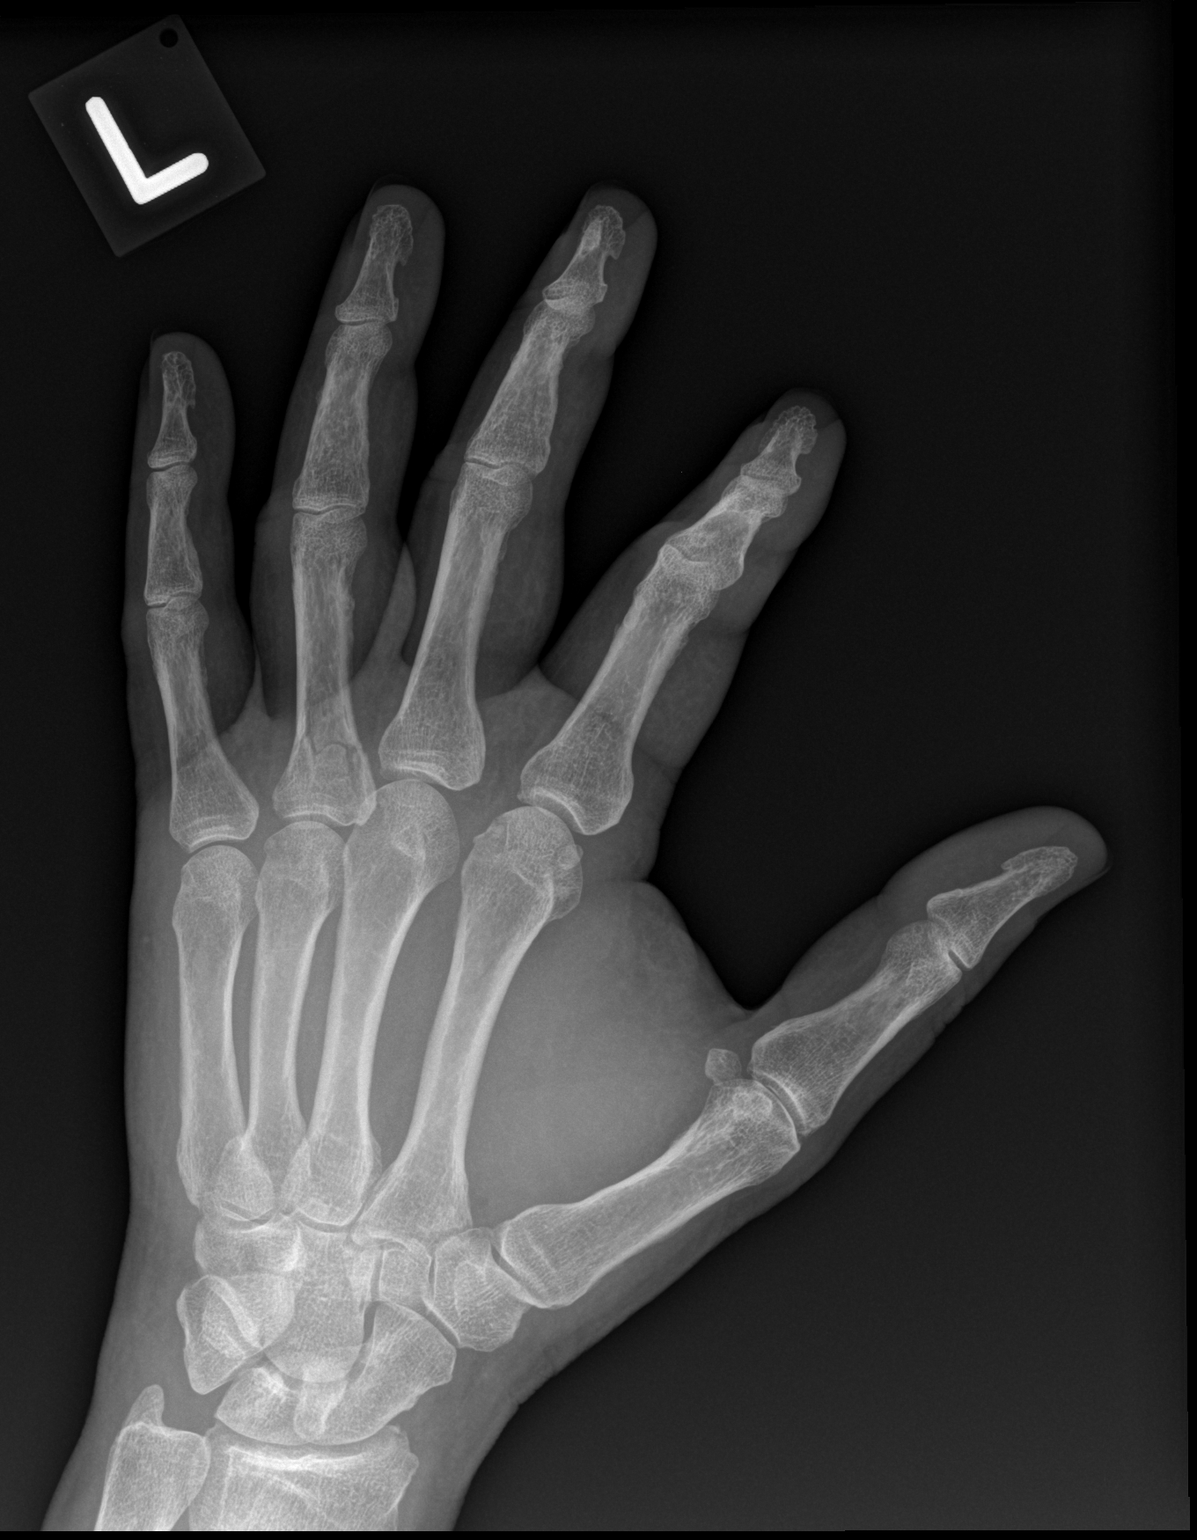

[x hand lat left]
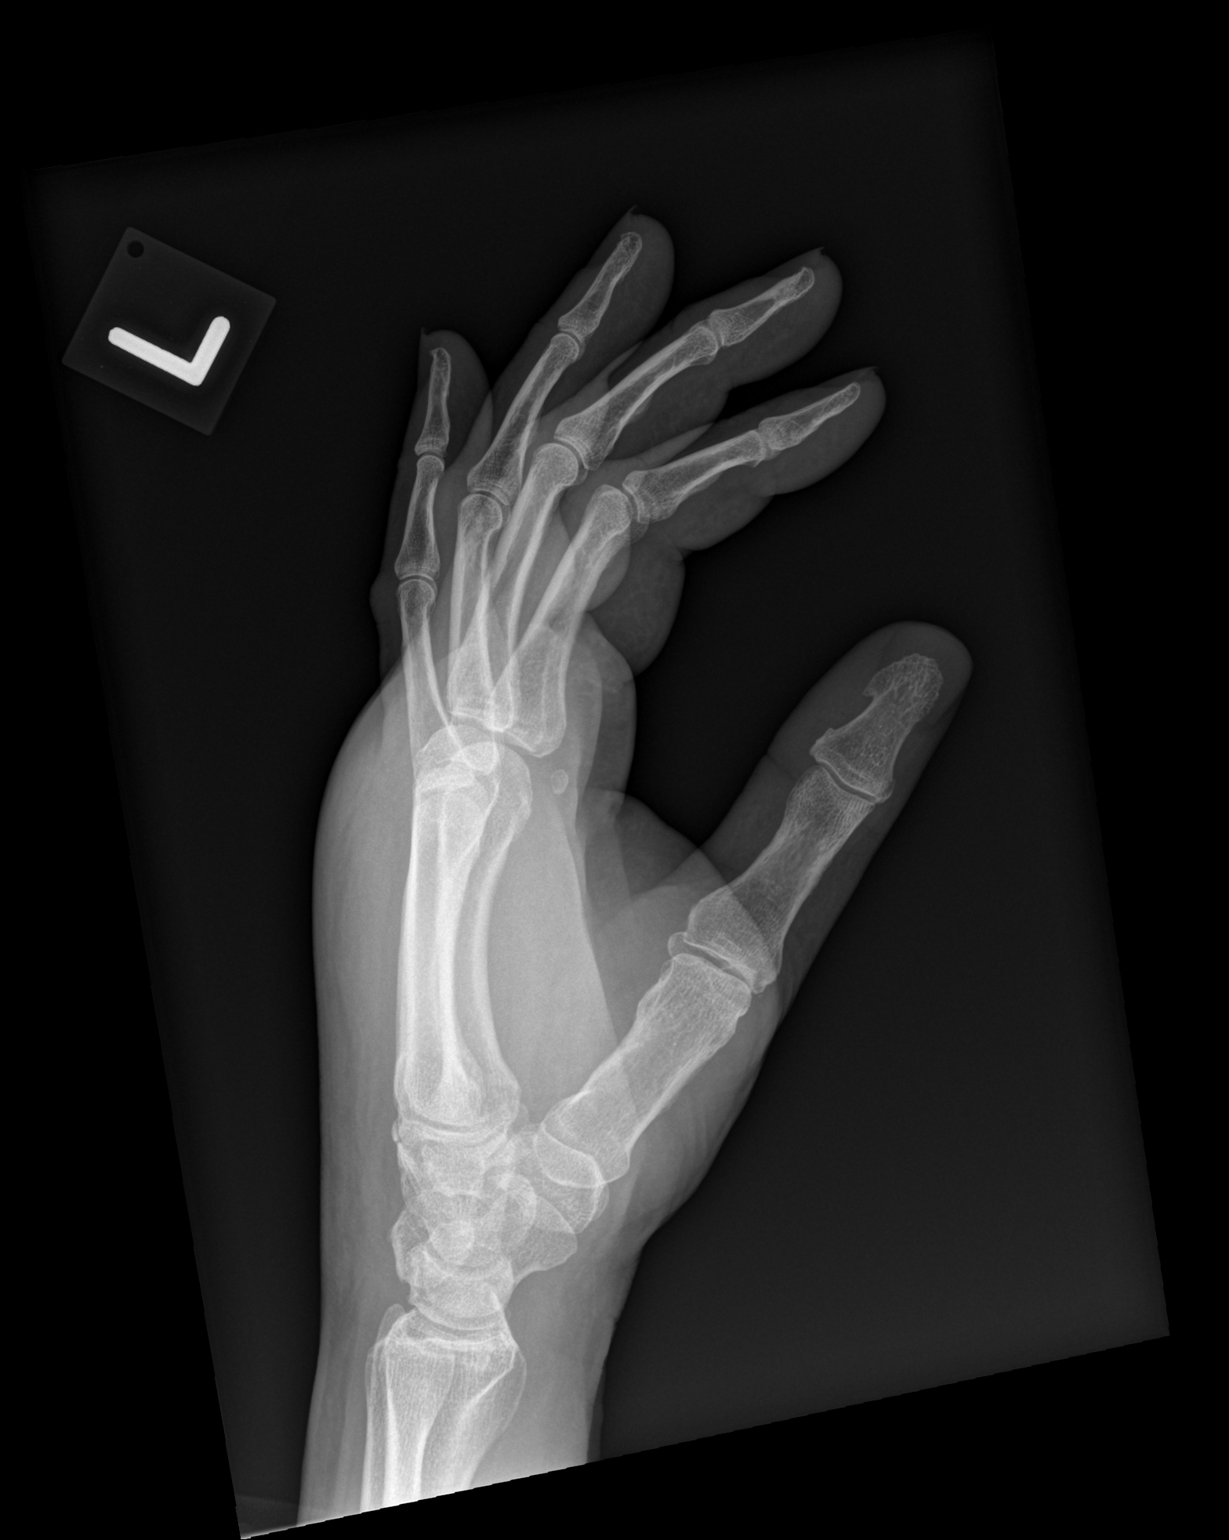

[3 of 3 positions shown; findings below may reference images not displayed]

FINDINGS: Mildly comminuted nondisplaced fracture above proximal aspect of the
left fourth proximal phalanx which appears to extend to the
articular surface. Overlying soft tissue swelling. No other acute
displaced fracture, subluxation or dislocation is noted.
IMPRESSION: 1. Acute nondisplaced mildly comminuted intra-articular fracture
through the base of the left fourth proximal phalanx.

## 2016-08-12 ENCOUNTER — Ambulatory Visit: Payer: Medicare Other

## 2016-08-16 ENCOUNTER — Encounter: Payer: Self-pay | Admitting: Physician Assistant

## 2016-08-16 ENCOUNTER — Ambulatory Visit (INDEPENDENT_AMBULATORY_CARE_PROVIDER_SITE_OTHER): Payer: Medicare Other | Admitting: Physician Assistant

## 2016-08-16 VITALS — BP 131/76 | HR 73 | Ht 69.0 in | Wt 189.6 lb

## 2016-08-16 DIAGNOSIS — E785 Hyperlipidemia, unspecified: Secondary | ICD-10-CM

## 2016-08-16 DIAGNOSIS — I25700 Atherosclerosis of coronary artery bypass graft(s), unspecified, with unstable angina pectoris: Secondary | ICD-10-CM

## 2016-08-16 DIAGNOSIS — I4891 Unspecified atrial fibrillation: Secondary | ICD-10-CM

## 2016-08-16 NOTE — Progress Notes (Signed)
Cardiology Office Note   Date:  08/16/2016   ID:  Lance Stevens, DOB March 14, 1946, MRN [id number]  PCP:  Harrison Mons, PA-C  Cardiologist:  Dr Alcide Evener, PA-C   Chief Complaint  Patient presents with  . Follow-up    Pt states no Sx or concerns.    History of Present Illness: Lance Stevens is a 70 y.o. male with a history of HTN, HLD, CKD III, DM, prostate CA w/ rad prostatectomy 04/2016, RBBB  Admitted 08/18>08/28 for CABG w/ LIMA-LAD, SVG-D1-RI-CFX, SVG-RCA-PDA, had Afib post-op, spont converted to SR, CHADSVASC of 4 (HTN, age, DM, vasc dz). Amio started But DC'd due to nausea, if has recurrence, short-term coumadin.  Lance Stevens presents for post hospital follow-up.  Since discharge from the hospital, he feels he has been doing pretty well. He uses medication and biofeedback to keep from having to take pain pills and has been successful with this. He has greatly improved his diet is compliant with his medications. He brings in his blood sugar chart and his blood sugars have all been between 89 and 166. He will attend diabetes classes.  He has not had lower extremity edema. He still feels a bit weak and tired, but is increasing his activity and is looking forward to going to cardiac rehabilitation. His blood pressure and heart rate have been well controlled.  He has had no palpitations and no feeling that he has been back in atrial fibrillation. However, he was not always aware when he was in atrial fibrillation when he was in the hospital. He does not currently have a home blood pressure cuff.  He saw [name] a week ago, and was still having a minor amount of drainage. A dressing was placed over the lower part of this incision at that time and has not been changed.   Past Medical History:  Diagnosis Date  . Allergy   . Anginal pain ([place])   . Borderline diabetes   . Bronchitis    recurrent  . CAD in native artery    a. Multivessel CAD by cath  07/2016 - tx with medical therapy with consideration of CABG for refractory angina.  . Essential hypertension   . Fracture, finger    LEFT HAND WITH SWELLING AND BRUISING  . Heart murmur    ? teen  . History of gout   . History of kidney stones   . History of kidney stones   . History of skin cancer    possible - pt not sure of dx  . Hyperlipidemia   . Pneumonia    hx  . Prostate cancer [facility])    a. s/p radical prostatectomy 04/2016.  Marland Kitchen Sinus infection    recurrent    Past Surgical History:  Procedure Laterality Date  . CARDIAC CATHETERIZATION N/A 07/12/2016   Procedure: Left Heart Cath and Coronary Angiography;  Surgeon: Peter M Martinique, MD;  Location: [place] [facility];  Service: Cardiovascular;  Laterality: N/A;  . CORONARY ARTERY BYPASS GRAFT N/A 07/23/2016   Procedure: CORONARY ARTERY BYPASS GRAFTING (CABG), ON PUMP, TIMES SIX, USING LEFT INTERNAL MAMMARY ARTERY, RIGHT GREATER SAPHENOUS VEIN HARVESTED ENDOSCOPICALLY;  Surgeon: Grace Isaac, MD;  Location: [place];  Service: Open Heart Surgery;  Laterality: N/A;  LIMA to LAD, SVG to DIAG, SEQ SVG to INTERMEDIATE and CIRC, SVG to DISTAL RIGHT, SVG to MID POSTERIOR LATERAL.  Marland Kitchen EYE SURGERY     B cataracts removed.  Marland Kitchen  EYE SURGERY     lens implants  . LITHOTRIPSY     98  . LYMPHADENECTOMY Bilateral 04/05/2016   Procedure: LYMPHADENECTOMY, BILATERAL PELVIC LYMPHADENECTOMY;  Surgeon: Raynelle Bring, MD;  Location: [facility];  Service: Urology;  Laterality: Bilateral;  . PROSTATE BIOPSY    . ROBOT ASSISTED LAPAROSCOPIC RADICAL PROSTATECTOMY N/A 04/05/2016   Procedure: XI ROBOTIC ASSISTED LAPAROSCOPIC RADICAL PROSTATECTOMY LEVEL 2;  Surgeon: Raynelle Bring, MD;  Location: [facility];  Service: Urology;  Laterality: N/A;  . TEE WITHOUT CARDIOVERSION N/A 07/23/2016   Procedure: TRANSESOPHAGEAL ECHOCARDIOGRAM (TEE);  Surgeon: Grace Isaac, MD;  Location: [place];  Service: Open Heart Surgery;  Laterality: N/A;  . TONSILLECTOMY AND ADENOIDECTOMY      also received radiation treatments, as was frequent in children at the time  . WISDOM TOOTH EXTRACTION      Current Outpatient Prescriptions  Medication Sig Dispense Refill  . acetaminophen (TYLENOL) 325 MG tablet Take 2 tablets (650 mg total) by mouth every 4 (four) hours as needed for headache or mild pain.    Marland Kitchen aspirin EC 325 MG EC tablet Take 1 tablet (325 mg total) by mouth daily. 30 tablet 0  . atorvastatin (LIPITOR) 80 MG tablet Take 1 tablet (80 mg total) by mouth daily at 6 PM. 30 tablet 1  . blood glucose meter kit and supplies KIT Dispense based on patient and insurance preference. Use up to four times daily as directed. (FOR ICD-9 250.00, 250.01). 1 each 0  . metFORMIN (GLUCOPHAGE) 500 MG tablet Take 1 tablet (500 mg total) by mouth 2 (two) times daily with a meal. 60 tablet 1  . metoprolol succinate (TOPROL-XL) 50 MG 24 hr tablet Take 1 tablet (50 mg total) by mouth daily. 30 tablet 1  . ondansetron (ZOFRAN) 4 MG tablet Take 1 tablet (4 mg total) by mouth every 8 (eight) hours as needed for nausea or vomiting. 14 tablet 0  . traMADol (ULTRAM) 50 MG tablet Take 1-2 tablets (50-100 mg total) by mouth every 6 (six) hours as needed for moderate pain. 28 tablet 0   No current facility-administered medications for this visit.     Allergies:   Penicillins    Social History:  The patient  reports that he has never smoked. He has never used smokeless tobacco. He reports that he drinks about 1.2 oz of alcohol per week . He reports that he does not use drugs.   Family History:  The patient's family history includes Cancer in his brother; Cancer (age of onset: 36) in his mother; Diabetes in his brother and father; Glaucoma in his mother; Heart attack in his father; Heart disease in his brother; Heart disease (age of onset: 77) in his father; Hyperlipidemia in his father; Parkinson's disease in his brother; Stroke in his maternal grandmother.    ROS:  Please see the history of present  illness. All other systems are reviewed and negative.    PHYSICAL EXAM: VS:  BP 131/76   Pulse 73   Ht '5\' 9"'  (1.753 m)   Wt 189 lb 9.6 oz (86 kg)   BMI 28.00 kg/m  , BMI Body mass index is 28 kg/m. GEN: Well nourished, well developed, male in no acute distress  HEENT: normal for age  Neck: no JVD, no carotid bruit, no masses Cardiac: RRR; no murmur, no rubs, or gallops Respiratory:  clear to auscultation bilaterally, normal work of breathing GI: soft, nontender, nondistended, + BS MS: no deformity or atrophy; no edema;  distal pulses are 2+ in all 4 extremities   Skin: warm and dry, no rash; minimal drainage seen on gauze, looks old and may have resolved. All other incisions are healing well Neuro:  Strength and sensation are intact Psych: euthymic mood, full affect   EKG:  EKG is ordered today. The ekg ordered today demonstrates sinus rhythm, heart rate 72, right bundle branch block is old.   Recent Labs: 07/17/2016: B Natriuretic Peptide 50.1 07/21/2016: ALT 24 07/24/2016: Magnesium 2.1 08/01/2016: BUN 21; Creatinine, Ser 1.41; Potassium 5.3; Sodium 137 08/02/2016: Hemoglobin 11.0; Platelets 470    Lipid Panel    Component Value Date/Time   CHOL 196 07/10/2016 0547   TRIG 365 (H) 07/10/2016 0547   HDL 24 (L) 07/10/2016 0547   CHOLHDL 8.2 07/10/2016 0547   VLDL 73 (H) 07/10/2016 0547   LDLCALC 99 07/10/2016 0547     Wt Readings from Last 3 Encounters:  08/16/16 189 lb 9.6 oz (86 kg)  08/05/16 195 lb (88.5 kg)  08/03/16 195 lb 3.2 oz (88.5 kg)     Other studies Reviewed: Additional studies/ records that were reviewed today include: Office notes, hospital records and testing.  ASSESSMENT AND PLAN:  1.  CAD, S/p CABG: He is recovering well after the surgery. He has no signs or symptoms of volume overload. His pain is been well-controlled. He still is weak at times. He is encouraged to continue to increase his activity and he is interested in cardiac rehabilitation. He  is on good medical therapy with aspirin, statin, and beta blocker. His EF was 55% by echo. Continue current therapy, continue to increase her activity, and follow-up with M.D.  2. PAF: He has not had any palpitations at all since his discharge. He does not have a home blood pressure cuff. He is encouraged to monitor himself for palpitations and check his blood pressure and heart rate occasionally. He and his wife are willing to get a blood pressure cuff.  For now, continue beta blocker and anticoagulation with full strength aspirin only. He is to contact us immediately if he gets any palpitations.  3. Dyslipidemia: Continue current medical therapy and we will check a cemented and a lipid profile in 3 months when he follows with PND.   Current medicines are reviewed at length with the patient today.  The patient does not have concerns regarding medicines.  The following changes have been made:  no change  Labs/ tests ordered today include:   Orders Placed This Encounter  Procedures  . COMPLETE METABOLIC PANEL WITH GFR  . Lipid panel  . EKG 12-Lead     Disposition:   FU with Dr. Stanford Breed  Signed, Rosaria Ferries, PA-C  08/16/2016 11:02 AM    [place] Phone: [phone]; Fax: [phone]  This note was written with the assistance of speech recognition software. Please excuse any transcriptional errors.

## 2016-08-16 NOTE — Patient Instructions (Addendum)
Medications:  Your physician recommends that you continue on your current medications as directed. Please refer to the Current Medication list given to you today.  Labwork:  Your physician recommends that you return for a FASTING lipid profile and a CMET in: 3 months    Other Instructions:  --Try to get a home BP cuff to records daily blood pressures. --Keep an eye on palpitations.   A palpitation is the feeling that your heartbeat is irregular. It may feel like your heart is fluttering or skipping a beat. It may also feel like your heart is beating faster than normal. This is usually not a serious problem. In some cases, you may need more medical tests. HOME CARE  Avoid: ? Caffeine in coffee, tea, soft drinks, diet pills, and energy drinks. ? Chocolate. ? Alcohol.  Stop smoking if you smoke.  Reduce your stress and anxiety. Try: ? A method that measures bodily functions so you can learn to control them (biofeedback). ? Yoga. ? Meditation. ? Physical activity such as swimming, jogging, or walking.  Get plenty of rest and sleep. GET HELP IF:  Your fast or irregular heartbeat continues after 24 hours.  Your palpitations occur more often. GET HELP RIGHT AWAY IF:   You have chest pain.  You feel short of breath.  You have a very bad headache.  You feel dizzy or pass out (faint). MAKE SURE YOU:   Understand these instructions.  Will watch your condition.  Will get help right away if you are not doing well or get worse.  This information is not intended to replace advice given to you by your health care provider. Make sure you discuss any questions you have with your health care provider.  Document Released: 08/31/2008 Document Revised: 12/13/2014 Document Reviewed: 01/21/2012 Elsevier Interactive Patient Education 2016 [employer].   Follow-Up:  Your physician recommends that you schedule a follow-up appointment in: 3 months with Dr. Stanford Breed.  If you  need a refill on your cardiac medications before your next appointment, please call your pharmacy.

## 2016-08-19 ENCOUNTER — Ambulatory Visit: Payer: Medicare Other

## 2016-08-24 ENCOUNTER — Encounter: Payer: Medicare Other | Attending: Physician Assistant | Admitting: Skilled Nursing Facility1

## 2016-08-24 DIAGNOSIS — Z713 Dietary counseling and surveillance: Secondary | ICD-10-CM | POA: Diagnosis present

## 2016-08-24 DIAGNOSIS — E119 Type 2 diabetes mellitus without complications: Secondary | ICD-10-CM | POA: Diagnosis not present

## 2016-08-24 DIAGNOSIS — Z951 Presence of aortocoronary bypass graft: Secondary | ICD-10-CM | POA: Diagnosis not present

## 2016-08-25 ENCOUNTER — Other Ambulatory Visit: Payer: Self-pay | Admitting: Cardiothoracic Surgery

## 2016-08-25 ENCOUNTER — Encounter: Payer: Self-pay | Admitting: Skilled Nursing Facility1

## 2016-08-25 DIAGNOSIS — Z951 Presence of aortocoronary bypass graft: Secondary | ICD-10-CM

## 2016-08-25 NOTE — Progress Notes (Signed)

## 2016-08-26 ENCOUNTER — Encounter: Payer: Self-pay | Admitting: Cardiothoracic Surgery

## 2016-08-31 ENCOUNTER — Encounter: Payer: Medicare Other | Admitting: Dietician

## 2016-08-31 DIAGNOSIS — E119 Type 2 diabetes mellitus without complications: Secondary | ICD-10-CM

## 2016-08-31 DIAGNOSIS — Z713 Dietary counseling and surveillance: Secondary | ICD-10-CM | POA: Diagnosis not present

## 2016-08-31 NOTE — Progress Notes (Signed)

## 2016-09-01 ENCOUNTER — Ambulatory Visit
Admission: RE | Admit: 2016-09-01 | Discharge: 2016-09-01 | Disposition: A | Discharge status: Home / Self Care | Payer: Medicare Other | Source: Ambulatory Visit | Attending: Cardiothoracic Surgery | Admitting: Cardiothoracic Surgery

## 2016-09-01 ENCOUNTER — Ambulatory Visit (INDEPENDENT_AMBULATORY_CARE_PROVIDER_SITE_OTHER): Payer: Self-pay | Admitting: Surgical

## 2016-09-01 VITALS — BP 115/70 | HR 69 | Resp 20 | Ht 69.0 in | Wt 190.0 lb

## 2016-09-01 DIAGNOSIS — Z951 Presence of aortocoronary bypass graft: Secondary | ICD-10-CM

## 2016-09-01 DIAGNOSIS — I2510 Atherosclerotic heart disease of native coronary artery without angina pectoris: Secondary | ICD-10-CM

## 2016-09-01 NOTE — Patient Instructions (Signed)
The patient was given verbal instructions regarding activity progression, diet and driving.

## 2016-09-01 NOTE — Progress Notes (Signed)
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  SURGICAL PROCEDURE:  Coronary artery bypass grafting x6 with the left internal mammary to the left anterior descending coronary artery, reversed saphenous vein graft to the diagonal coronary artery, sequential reversed saphenous vein graft to the intermediate and circumflex coronary artery, sequential reversed saphenous vein graft to the distal right coronary artery and distal posterior lateral branch of the right coronary artery, with greater saphenous vein harvesting endoscopically, right thigh and calf.  SURGEON:  Lanelle Bal, MD.  FIRST ASSISTANT:  Valentina Gu, surgical assistant.    History of Present Illness:    The patient is a 70 year old male who is status post the above described procedure. He reports he is doing quite well postoperatively without any significant current issues. He has restarted driving and is having no difficulties. He is anxious to begin cardiac rehabilitation. He is having no difficulties with his incisions. He is breathing comfortably without shortness of breath. He is not having any anginal equivalence. He is not having any surgical pain. He denies fevers, chills or other constitutional symptoms. His blood sugars have been under good control.       Zubrod Score: At the time of surgery this patient's most appropriate activity status/level should be  described as: '[x]'     0    Normal activity, no symptoms '[]'     1    Restricted in physical strenuous activity but ambulatory, able to do out light work '[]'     2    Ambulatory and capable of self care, unable to do work activities, up and about                 >50 % of waking hours                                                                                   '[]'     3    Only limited self care, in bed greater than 50% of waking hours '[]'     4    Completely disabled, no self care, confined to bed or chair '[]'     5    Moribund  History  Smoking Status  . Never Smoker  Smokeless Tobacco  . Never Used       Allergies  Allergen Reactions  . Penicillins Swelling and Other (See Comments)    As a child. Has  patient had a PCN reaction causing immediate rash, facial/tongue/throat swelling, SOB or lightheadedness with hypotension: Yes Has patient had a PCN reaction causing severe rash involving mucus membranes or skin necrosis: No Has patient had a PCN reaction that required hospitalization: Was already in hospital when reaction happened Has patient had a PCN reaction occurring within the last 10 years: No  If all of the above answers are "NO", then may proceed with Cephalosporin use.     Current Outpatient Prescriptions  Medication Sig Dispense Refill  . aspirin EC 325 MG EC tablet Take 1 tablet (325 mg total) by mouth daily. 30 tablet 0  . atorvastatin (LIPITOR) 80 MG tablet Take 1 tablet (80 mg total) by mouth daily at 6 PM. 30 tablet 1  . metFORMIN (GLUCOPHAGE) 500 MG tablet Take 1 tablet (500 mg total) by mouth 2 (two) times daily with a meal. 60 tablet 1  . metoprolol succinate (TOPROL-XL) 50 MG 24 hr tablet Take 1 tablet (50 mg total) by mouth daily. 30 tablet 1  . traMADol (ULTRAM) 50 MG tablet Take 1-2 tablets (50-100 mg total) by mouth every 6 (six) hours as needed for moderate pain. 28 tablet 0  . blood glucose meter kit and supplies KIT Dispense based on patient and insurance  preference. Use up to four times daily as directed. (FOR ICD-9 250.00, 250.01). (Patient not taking: Reported on 09/01/2016) 1 each 0   No current facility-administered medications for this visit.        Physical Exam: BP 115/70 (BP Location: Right Arm, Patient Position: Sitting, Cuff Size: Normal)   Pulse 69   Resp 20   Ht '5\' 9"'  (1.753 m)   Wt 190 lb (86.2 kg)   SpO2 99% Comment: RA  BMI 28.06 kg/m   General appearance: alert, cooperative and no distress Heart: regular rate and rhythm Lungs: clear to auscultation bilaterally Abdomen: Benign exam Extremities: No edema Wounds: healing well without evidence of infection.   Diagnostic Studies & Laboratory data:         Recent Radiology Findings: Dg Chest 2 View  Result Date: 09/01/2016 CLINICAL DATA:  Status post coronary artery bypass grafting for coronary artery disease. Hypertension. EXAM: CHEST  2 VIEW COMPARISON:  August 05, 2016 FINDINGS: There is no edema or consolidation. Heart size and pulmonary vascularity are normal. No adenopathy. Patient is status post coronary artery bypass grafting. No pneumothorax. There is degenerative change in the thoracic spine. IMPRESSION: No edema or consolidation.  Cardiac silhouette within normal limits. Electronically Signed   By: Lowella Grip III M.D.   On: 09/01/2016 13:39      I have independently reviewed the above radiology findings and reviewed findings  with the patient.  Recent Labs: Lab Results  Component Value Date   WBC 10.4 08/02/2016   HGB 11.0 (L) 08/02/2016   HCT 33.5 (L) 08/02/2016   PLT 470 (H) 08/02/2016   GLUCOSE 132 (H) 08/01/2016   CHOL 196 07/10/2016   TRIG 365 (H) 07/10/2016   HDL 24 (L) 07/10/2016   LDLCALC 99 07/10/2016   ALT 24 07/21/2016   AST 15 07/21/2016   NA 137 08/01/2016   K 5.3 (H) 08/01/2016   CL 101 08/01/2016   CREATININE 1.41 (H) 08/01/2016   BUN 21 (H) 08/01/2016   CO2 29 08/01/2016   INR 1.44 07/23/2016   HGBA1C 7.0 (H)  07/21/2016      Assessment / Plan:        Excellent postoperative recovery following  his CABG. He is doing well with primary prevention issues with good control of his blood glucose. He has lost weight and is eating any much healthier manner. He will begin cardiac rehabilitation. We will see again on a when necessary basis for any surgically related issues or at request.   GOLD,WAYNE E 09/01/2016 2:22 PM

## 2016-09-07 ENCOUNTER — Encounter: Payer: Medicare Other | Attending: Physician Assistant | Admitting: Skilled Nursing Facility1

## 2016-09-07 DIAGNOSIS — Z713 Dietary counseling and surveillance: Secondary | ICD-10-CM | POA: Insufficient documentation

## 2016-09-07 DIAGNOSIS — E119 Type 2 diabetes mellitus without complications: Secondary | ICD-10-CM | POA: Diagnosis not present

## 2016-09-07 DIAGNOSIS — Z951 Presence of aortocoronary bypass graft: Secondary | ICD-10-CM | POA: Diagnosis not present

## 2016-09-07 NOTE — Progress Notes (Signed)
Patient was seen on 09/07/2016 for the third of a series of three diabetes self-management courses at the [facility]. The following learning objectives were met by the patient during this class:  . State the amount of activity recommended for healthy living . Describe activities suitable for individual needs . Identify ways to regularly incorporate activity into daily life . Identify barriers to activity and ways to over come these barriers  Identify diabetes medications being personally used and their primary action for lowering glucose and possible side effects . Describe role of stress on blood glucose and develop strategies to address psychosocial issues . Identify diabetes complications and ways to prevent them  Explain how to manage diabetes during illness . Evaluate success in meeting personal goal . Establish 2-3 goals that they will plan to diligently work on until they return for the  57-monthfollow-up visit  Goals:   I will count my carb choices at most meals and snacks  I will be active 60 minutes or more 6 times a week  I will take my diabetes medications as scheduled  Your patient has identified their diabetes self-care support plan as  [facility]  Attend Monthly Diabetes Support Group as needed or make a future follow up appointment

## 2016-09-10 ENCOUNTER — Ambulatory Visit (INDEPENDENT_AMBULATORY_CARE_PROVIDER_SITE_OTHER): Payer: Medicare Other | Admitting: Physician Assistant

## 2016-09-10 VITALS — BP 118/76 | HR 56 | Temp 97.8°F | Resp 16 | Ht 69.0 in | Wt 190.0 lb

## 2016-09-10 DIAGNOSIS — E785 Hyperlipidemia, unspecified: Secondary | ICD-10-CM

## 2016-09-10 DIAGNOSIS — I2583 Coronary atherosclerosis due to lipid rich plaque: Secondary | ICD-10-CM

## 2016-09-10 DIAGNOSIS — Z23 Encounter for immunization: Secondary | ICD-10-CM | POA: Diagnosis not present

## 2016-09-10 DIAGNOSIS — N182 Chronic kidney disease, stage 2 (mild): Secondary | ICD-10-CM | POA: Diagnosis not present

## 2016-09-10 DIAGNOSIS — E119 Type 2 diabetes mellitus without complications: Secondary | ICD-10-CM

## 2016-09-10 DIAGNOSIS — I10 Essential (primary) hypertension: Secondary | ICD-10-CM

## 2016-09-10 DIAGNOSIS — I2510 Atherosclerotic heart disease of native coronary artery without angina pectoris: Secondary | ICD-10-CM

## 2016-09-10 LAB — COMPREHENSIVE METABOLIC PANEL
ALK PHOS: 122 U/L — AB (ref 40–115)
ALT: 31 U/L (ref 9–46)
AST: 15 U/L (ref 10–35)
Albumin: 4.1 g/dL (ref 3.6–5.1)
BUN: 16 mg/dL (ref 7–25)
CALCIUM: 9.8 mg/dL (ref 8.6–10.3)
CO2: 29 mmol/L (ref 20–31)
Chloride: 104 mmol/L (ref 98–110)
Creat: 1.13 mg/dL (ref 0.70–1.25)
Glucose, Bld: 78 mg/dL (ref 65–99)
POTASSIUM: 5.2 mmol/L (ref 3.5–5.3)
Sodium: 141 mmol/L (ref 135–146)
TOTAL PROTEIN: 6.6 g/dL (ref 6.1–8.1)
Total Bilirubin: 0.8 mg/dL (ref 0.2–1.2)

## 2016-09-10 LAB — LIPID PANEL
CHOL/HDL RATIO: 4.7 ratio (ref <=5.0)
CHOLESTEROL: 98 mg/dL — AB (ref 125–200)
HDL: 21 mg/dL — AB (ref >=40)
LDL Cholesterol: 46 mg/dL (ref <130)
Triglycerides: 153 mg/dL — ABNORMAL HIGH (ref <150)
VLDL: 31 mg/dL — ABNORMAL HIGH (ref <30)

## 2016-09-10 LAB — HEMOGLOBIN A1C
Hgb A1c MFr Bld: 5.7 % — ABNORMAL HIGH (ref <5.7)
Mean Plasma Glucose: 117 mg/dL

## 2016-09-10 NOTE — Progress Notes (Signed)
Patient ID: Lance Stevens, male    DOB: 09-Oct-1946, 70 y.o.   MRN: [id number]  PCP: Harrison Mons, PA-C  Subjective:   Chief Complaint  Patient presents with  . Follow-up    on diabetes     HPI Presents for evaluation of diabetes. He is unaccompanied today.  He has completed 3 Diabetes Education classes, and feels good about his new understanding of the disease process. He is optimistic that he will be able to control it with lifestyle changes, and hopes to stop most of the medications, however, he has read that some of them can be helpful (metformin, ACE-I) even when the numbers are controlled.  Has been released by [name] following CABG. Anticipates that with additional weight loss (goal of 170 lbs), may no longer need metformin.  His computation of A1C is 5.7%, based on his home glucose readings. Desires a "full panel" of blood, including PSA. PSA recommended in November by urology. Incontinence following surgery for prostate cancer has nearly resolved.  Has noticed sexual side effects with Toprol XL. This is beyond the reduced erectile function following prostatectomy. Previously had similar effects with lisinopril.   Review of Systems No CP, SOB, HA, dizziness. No nausea, vomiting, diarrhea. No new muscle or joint pain. No rash.    Patient Active Problem List   Diagnosis Date Noted  . Atrial fibrillation with rapid ventricular response ([place]) 07/26/2016  . S/P CABG (coronary artery bypass graft) 07/23/2016  . Essential hypertension 07/17/2016  . CKD (chronic kidney disease), stage II 07/12/2016  . Dyslipidemia 07/12/2016  . CAD (coronary artery disease) 07/12/2016  . Prostate cancer ([place]) 04/05/2016  . 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 Hypertriglyceridemia 12/11/2015  . Diabetes mellitus type 2 in nonobese ([place]) 05/27/2015  . Need for shingles vaccine  05/27/2015  . Family history of Parkinson's disease 05/27/2015  . History of nephrolithiasis 05/27/2015     Prior to Admission medications   Medication Sig Start Date End Date Taking? Authorizing Provider  aspirin EC 325 MG EC tablet Take 1 tablet (325 mg total) by mouth daily. 07/28/16  Yes Donielle Liston Alba, PA-C  atorvastatin (LIPITOR) 80 MG tablet Take 1 tablet (80 mg total) by mouth daily at 6 PM. 07/28/16  Yes Donielle Liston Alba, PA-C  blood glucose meter kit and supplies KIT Dispense based on patient and insurance preference. Use up to four times daily as directed. (FOR ICD-9 250.00, 250.01). 08/06/16  Yes Donielle Liston Alba, PA-C  metFORMIN (GLUCOPHAGE) 500 MG tablet Take 1 tablet (500 mg total) by mouth 2 (two) times daily with a meal. 07/28/16  Yes Donielle Liston Alba, PA-C  metoprolol succinate (TOPROL-XL) 50 MG 24 hr tablet Take 1 tablet (50 mg total) by mouth daily. 08/05/16  Yes Donielle Liston Alba, PA-C  traMADol (ULTRAM) 50 MG tablet Take 1-2 tablets (50-100 mg total) by mouth every 6 (six) hours as needed for moderate pain. 07/30/16  Yes Donielle Liston Alba, PA-C     Allergies  Allergen Reactions  . Penicillins Swelling and Other (See Comments)    As a child. Has patient had a PCN reaction causing immediate rash, facial/tongue/throat swelling, SOB or lightheadedness with hypotension: Yes Has patient had a PCN reaction causing severe rash involving mucus membranes or skin necrosis: No Has patient had a PCN reaction that required hospitalization: Was already in hospital when reaction  happened Has patient had a PCN reaction occurring within the last 10 years: No  If all of the above answers are "NO", then may proceed with Cephalosporin use.        Objective:  Physical Exam  Constitutional: He is oriented to person, place, and time. He appears well-developed and well-nourished. He is active and cooperative. No distress.  BP 118/76 (BP Location: Right Arm, Patient  Position: Sitting, Cuff Size: Normal)   Pulse (!) 56   Temp 97.8 F (36.6 C) (Oral)   Resp 16   Ht '5\' 9"'  (1.753 m)   Wt 190 lb (86.2 kg)   SpO2 98%   BMI 28.06 kg/m   HENT:  Head: Normocephalic and atraumatic.  Right Ear: Hearing normal.  Left Ear: Hearing normal.  Eyes: Conjunctivae are normal. No scleral icterus.  Neck: Normal range of motion. Neck supple. No thyromegaly present.  Cardiovascular: Normal rate, regular rhythm and normal heart sounds.   Pulses:      Radial pulses are 2+ on the right side, and 2+ on the left side.  Pulmonary/Chest: Effort normal and breath sounds normal.  Lymphadenopathy:       Head (right side): No tonsillar, no preauricular, no posterior auricular and no occipital adenopathy present.       Head (left side): No tonsillar, no preauricular, no posterior auricular and no occipital adenopathy present.    He has no cervical adenopathy.       Right: No supraclavicular adenopathy present.       Left: No supraclavicular adenopathy present.  Neurological: He is alert and oriented to person, place, and time. No sensory deficit.  Skin: Skin is warm, dry and intact. No rash noted. No cyanosis or erythema. Nails show no clubbing.  Psychiatric: He has a normal mood and affect. His speech is normal and behavior is normal.           Assessment & Plan:   1. Diabetes mellitus type 2 in nonobese [facility]) Await labs. Adjust regimen as indicated by results. Would consider maintenance of low-dose metformin, even as A1C improves. - Hemoglobin A1c - Comprehensive metabolic panel - Microalbumin, urine  2. Coronary artery disease due to lipid rich plaque Released by surgeon following CABG. Continue to control BP, cholesterol and glucose.  3. CKD (chronic kidney disease), stage II Continue to maintain control of BP and glucose. - Comprehensive metabolic panel  4. Essential hypertension Controlled. - Comprehensive metabolic panel  5. Dyslipidemia Await labs.  Adjust regimen as indicated by results. - Comprehensive metabolic panel - Lipid panel  6. Need for influenza vaccination - Flu Vaccine QUAD 36+ mos IM  We elect to wait on the PSA and do that in November, as recommended by his urologist.      Fara Chute, PA-C Physician Assistant-Certified [facility]

## 2016-09-10 NOTE — Patient Instructions (Signed)
     IF you received an x-ray today, you will receive an invoice from [facility]. Please contact  Radiology at [phone] with questions or concerns regarding your invoice.   IF you received labwork today, you will receive an invoice from [facility]. Please contact [facility] at [phone] with questions or concerns regarding your invoice.   Our billing staff will not be able to assist you with questions regarding bills from these companies.  You will be contacted with the lab results as soon as they are available. The fastest way to get your results is to activate your My Chart account. Instructions are located on the last page of this paperwork. If you have not heard from us regarding the results in 2 weeks, please contact this office.      

## 2016-09-11 LAB — MICROALBUMIN, URINE: Microalb, Ur: 0.8 mg/dL

## 2016-09-20 ENCOUNTER — Telehealth (HOSPITAL_COMMUNITY): Payer: Self-pay | Admitting: *Deleted

## 2016-09-20 NOTE — Telephone Encounter (Signed)
Return call from message left earlier. Message left for pt to please call back for sign up. Cherre Huger, BSN

## 2016-09-27 ENCOUNTER — Encounter (HOSPITAL_COMMUNITY): Admission: RE | Admit: 2016-09-27 | Payer: Medicare Other | Source: Ambulatory Visit

## 2016-09-27 ENCOUNTER — Ambulatory Visit (INDEPENDENT_AMBULATORY_CARE_PROVIDER_SITE_OTHER): Payer: Medicare Other | Admitting: Physician Assistant

## 2016-09-27 VITALS — BP 116/74 | HR 60 | Temp 97.5°F | Resp 17 | Ht 69.0 in | Wt 190.0 lb

## 2016-09-27 DIAGNOSIS — R197 Diarrhea, unspecified: Secondary | ICD-10-CM | POA: Diagnosis not present

## 2016-09-27 MED ORDER — ZOSTER VACCINE LIVE 19400 UNT/0.65ML ~~LOC~~ SUSR: 0.6500 mL | Freq: Once | SUBCUTANEOUS | 0 refills | Status: AC

## 2016-09-27 NOTE — Patient Instructions (Addendum)
  Hold the metformin for 2 weeks, or until the diarrhea resolves. If it doesn't resolve, come back in so we can look at the stool. If it does, restart the metformin at only 250 mg (1/2 tablet) one time each day. If you tolerate that for several days, increase it to twice a day. If you tolerate that for a few days, resume the original dose.   IF you received an x-ray today, you will receive an invoice from [facility]. Please contact [facility] at [phone] with questions or concerns regarding your invoice.   IF you received labwork today, you will receive an invoice from [employer]. Please contact [facility] at [phone] with questions or concerns regarding your invoice.   Our billing staff will not be able to assist you with questions regarding bills from these companies.  You will be contacted with the lab results as soon as they are available. The fastest way to get your results is to activate your My Chart account. Instructions are located on the last page of this paperwork. If you have not heard from [place] regarding the results in 2 weeks, please contact this office.

## 2016-09-27 NOTE — Progress Notes (Signed)
Patient ID: Lance Stevens, male    DOB: May 11, 1946, 70 y.o.   MRN: [id number]  PCP: Harrison Mons, PA-C  Subjective:   Chief Complaint  Patient presents with  . Diarrhea    [id number]. PT DENIES ANY PAIN.     HPI Presents for evaluation of diarrhea x 13 days.  Symptoms began after eating some very rich fish chowder. That evening, he had 4-5 loose stools, and since then he's had episodes each night, with 3-4 loose stools each. Generally does fine during the day, but at night he has stool urgency and gets up from bed to stool.  Worse with rich foods, orange juice and coffee.  No abdominal pain or cramping. No blood, melena or mucous. No nausea, vomiting. No fever, chills.  He started on metformin in August for treatment of new diagnosis of diabetes. He did not initially have any stool changes.  "I don't think I'm sick. I don't feel bad at all."      Review of Systems As above.    Patient Active Problem List   Diagnosis Date Noted  . Atrial fibrillation with rapid ventricular response ([place]) 07/26/2016  . S/P CABG (coronary artery bypass graft) 07/23/2016  . Essential hypertension 07/17/2016  . CKD (chronic kidney disease), stage II 07/12/2016  . Dyslipidemia 07/12/2016  . CAD (coronary artery disease) 07/12/2016  . Prostate cancer ([place]) 04/05/2016  . 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 Hypertriglyceridemia 12/11/2015  . Diabetes mellitus type 2 in nonobese ([place]) 05/27/2015  . Need for shingles vaccine 05/27/2015  . Family history of Parkinson's disease 05/27/2015  . History of nephrolithiasis 05/27/2015     Prior to Admission medications   Medication Sig Start Date End Date Taking? Authorizing Provider  aspirin EC 325 MG EC tablet Take 1 tablet (325 mg total) by mouth daily. 07/28/16  Yes Donielle Liston Alba, PA-C  atorvastatin (LIPITOR) 80 MG tablet Take 1 tablet (80  mg total) by mouth daily at 6 PM. 07/28/16  Yes Donielle Liston Alba, PA-C  blood glucose meter kit and supplies KIT Dispense based on patient and insurance preference. Use up to four times daily as directed. (FOR ICD-9 250.00, 250.01). 08/06/16  Yes Donielle Liston Alba, PA-C  metFORMIN (GLUCOPHAGE) 500 MG tablet Take 1 tablet (500 mg total) by mouth 2 (two) times daily with a meal. 07/28/16  Yes Donielle Liston Alba, PA-C  metoprolol succinate (TOPROL-XL) 50 MG 24 hr tablet Take 1 tablet (50 mg total) by mouth daily. 08/05/16  Yes Donielle Liston Alba, PA-C  traMADol (ULTRAM) 50 MG tablet Take 1-2 tablets (50-100 mg total) by mouth every 6 (six) hours as needed for moderate pain. Patient not taking: Reported on 09/27/2016 07/30/16   Nani Skillern, PA-C     Allergies  Allergen Reactions  . Penicillins Swelling and Other (See Comments)    As a child. Has patient had a PCN reaction causing immediate rash, facial/tongue/throat swelling, SOB or lightheadedness with hypotension: Yes Has patient had a PCN reaction causing severe rash involving mucus membranes or skin necrosis: No Has patient had a PCN reaction that required hospitalization: Was already in hospital when reaction happened Has patient had a PCN reaction occurring within the last 10 years: No  If all of the above answers are "NO", then may proceed with Cephalosporin use.   Marland Kitchen Lisinopril Other (See Comments)  Sexual side effects       Objective:  Physical Exam  Constitutional: He is oriented to person, place, and time. He appears well-developed and well-nourished. He is active and cooperative. No distress.  BP 116/74 (BP Location: Right Arm, Patient Position: Sitting, Cuff Size: Large)   Pulse 60   Temp 97.5 F (36.4 C) (Oral)   Resp 17   Ht '5\' 9"'  (1.753 m)   Wt 190 lb (86.2 kg)   SpO2 97%   BMI 28.06 kg/m   HENT:  Head: Normocephalic and atraumatic.  Right Ear: Hearing normal.  Left Ear: Hearing normal.  Eyes:  Conjunctivae are normal. No scleral icterus.  Neck: Normal range of motion. Neck supple. No thyromegaly present.  Cardiovascular: Normal rate, regular rhythm and normal heart sounds.   Pulses:      Radial pulses are 2+ on the right side, and 2+ on the left side.  Pulmonary/Chest: Effort normal and breath sounds normal.  Lymphadenopathy:       Head (right side): No tonsillar, no preauricular, no posterior auricular and no occipital adenopathy present.       Head (left side): No tonsillar, no preauricular, no posterior auricular and no occipital adenopathy present.    He has no cervical adenopathy.       Right: No supraclavicular adenopathy present.       Left: No supraclavicular adenopathy present.  Neurological: He is alert and oriented to person, place, and time. No sensory deficit.  Skin: Skin is warm, dry and intact. No rash noted. No cyanosis or erythema. Nails show no clubbing.  Psychiatric: He has a normal mood and affect. His speech is normal and behavior is normal.           Assessment & Plan:   1. Diarrhea, unspecified type He declines stool studies for now ("that's gross"). Hold metformin. When diarrhea resolves, resume it at 1/2 tablet QD and advance by 250 mg every 3-5 days back to 500 mg BID. Counseled that loose stools are more likely if his diet is not as healthy as recommended. If diarrhea persists x 2 weeks, RTC for additional evaluation.   Fara Chute, PA-C Physician Assistant-Certified [facility]

## 2016-09-29 ENCOUNTER — Encounter (HOSPITAL_COMMUNITY): Payer: Medicare Other

## 2016-10-01 ENCOUNTER — Encounter (HOSPITAL_COMMUNITY): Payer: Medicare Other

## 2016-10-04 ENCOUNTER — Encounter (HOSPITAL_COMMUNITY): Payer: Medicare Other

## 2016-10-06 ENCOUNTER — Encounter (HOSPITAL_COMMUNITY): Payer: Medicare Other

## 2016-10-08 ENCOUNTER — Encounter (HOSPITAL_COMMUNITY): Payer: Medicare Other

## 2016-10-11 ENCOUNTER — Encounter (HOSPITAL_COMMUNITY): Payer: Medicare Other

## 2016-10-13 ENCOUNTER — Encounter (HOSPITAL_COMMUNITY): Payer: Medicare Other

## 2016-10-14 ENCOUNTER — Encounter (HOSPITAL_COMMUNITY)
Admission: RE | Admit: 2016-10-14 | Discharge: 2016-10-14 | Disposition: A | Discharge status: Home / Self Care | Payer: Medicare Other | Source: Ambulatory Visit | Attending: Cardiology | Admitting: Cardiology

## 2016-10-14 ENCOUNTER — Encounter (HOSPITAL_COMMUNITY): Payer: Self-pay

## 2016-10-14 VITALS — BP 122/62 | HR 65 | Ht 69.0 in | Wt 190.7 lb

## 2016-10-14 DIAGNOSIS — I10 Essential (primary) hypertension: Secondary | ICD-10-CM | POA: Diagnosis not present

## 2016-10-14 DIAGNOSIS — I2510 Atherosclerotic heart disease of native coronary artery without angina pectoris: Secondary | ICD-10-CM | POA: Insufficient documentation

## 2016-10-14 DIAGNOSIS — E785 Hyperlipidemia, unspecified: Secondary | ICD-10-CM | POA: Insufficient documentation

## 2016-10-14 DIAGNOSIS — Z951 Presence of aortocoronary bypass graft: Secondary | ICD-10-CM | POA: Insufficient documentation

## 2016-10-14 DIAGNOSIS — Z79899 Other long term (current) drug therapy: Secondary | ICD-10-CM | POA: Insufficient documentation

## 2016-10-14 DIAGNOSIS — Z7982 Long term (current) use of aspirin: Secondary | ICD-10-CM | POA: Diagnosis not present

## 2016-10-14 DIAGNOSIS — R7303 Prediabetes: Secondary | ICD-10-CM | POA: Diagnosis not present

## 2016-10-14 DIAGNOSIS — Z7984 Long term (current) use of oral hypoglycemic drugs: Secondary | ICD-10-CM | POA: Insufficient documentation

## 2016-10-14 DIAGNOSIS — Z8546 Personal history of malignant neoplasm of prostate: Secondary | ICD-10-CM | POA: Insufficient documentation

## 2016-10-14 DIAGNOSIS — Z87442 Personal history of urinary calculi: Secondary | ICD-10-CM | POA: Insufficient documentation

## 2016-10-14 NOTE — Progress Notes (Signed)
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  aspirin EC 325 MG EC tablet, Take 1 tablet (325 mg total) by mouth daily., Disp: 30 tablet, Rfl: 0 .  atorvastatin (LIPITOR) 80 MG tablet, Take 1 tablet (80 mg total) by mouth daily at 6 PM., Disp: 30 tablet, Rfl: 1 .  blood glucose meter kit and supplies KIT, Dispense based on patient and insurance preference. Use up to four times daily as directed. (FOR ICD-9 250.00, 250.01)., Disp: 1 each, Rfl: 0 .  metFORMIN (GLUCOPHAGE) 500 MG tablet, Take 1 tablet (500 mg total) by mouth 2 (two) times daily with a meal., Disp: 60 tablet, Rfl: 1 .  metoprolol succinate (TOPROL-XL) 50 MG 24 hr tablet, Take 1 tablet (50 mg total) by mouth daily., Disp: 30 tablet, Rfl: 1  Past Medical History: Past Medical History:  Diagnosis Date  . Allergy   . Anginal pain ([place])   . Borderline diabetes   . Bronchitis    recurrent  . CAD in native artery    a. Multivessel CAD by cath 07/2016 - tx with medical therapy with consideration of CABG for refractory angina.  . Essential hypertension   . Fracture, finger    LEFT HAND WITH SWELLING AND BRUISING  . Heart murmur    ? teen  . History of gout   . History of kidney stones   . History of kidney stones   . History of skin cancer    possible - pt not sure of dx  . Hyperlipidemia   . Pneumonia    hx  . Prostate cancer [facility])    a. s/p radical  prostatectomy 04/2016.  [place] Sinus infection    recurrent    Tobacco Use: History  Smoking Status  . Never Smoker  Smokeless Tobacco  . Never Used    Labs: Recent Review Flowsheet Data    Labs for ITP Cardiac and Pulmonary Rehab Latest Ref Rng & Units 07/23/2016 07/23/2016 07/23/2016 07/24/2016 09/10/2016   Cholestrol 125 - 200 mg/dL - - - - 98(L)   LDLCALC <130 mg/dL - - - - 46   HDL >=40 mg/dL - - - - 21(L)   Trlycerides <150 mg/dL - - - - 153(H)   Hemoglobin A1c <5.7 % - - - - 5.7(H)   PHART 7.350 - 7.450 7.417 7.403 - - -   PCO2ART 35.0 - 45.0 mmHg 41.3 39.9 - - -   HCO3 20.0 - 24.0 mEq/L 26.8(H) 24.9(H) - - -   TCO2 0 - 100 mmol/L _0 -   ACIDBASEDEF 0.0 - 2.0 mmol/L - - - - -   O2SAT % 100.0 99.0 - - -      Capillary Blood Glucose: Lab Results  Component Value Date   GLUCAP 100 (H) 08/02/2016   GLUCAP 102 (H) 08/02/2016   GLUCAP 132 (H) 08/01/2016   GLUCAP 129 (H) 08/01/2016   GLUCAP 142 (  H) 08/01/2016     Exercise Target Goals: Date: 10/14/16  Exercise Program Goal: Individual exercise prescription set with THRR, safety & activity barriers. Participant demonstrates ability to understand and report RPE using BORG scale, to self-measure pulse accurately, and to acknowledge the importance of the exercise prescription.  Exercise Prescription Goal: Starting with aerobic activity 30 plus minutes a day, 3 days per week for initial exercise prescription. Provide home exercise prescription and guidelines that participant acknowledges understanding prior to discharge.  Activity Barriers & Risk Stratification:     Activity Barriers & Cardiac Risk Stratification - 10/14/16 0837      Activity Barriers & Cardiac Risk Stratification   Activity Barriers Neck/Spine Problems   Cardiac Risk Stratification High      6 Minute Walk:     6 Minute Walk    Row Name 10/14/16 1252         6 Minute Walk   Phase Initial     Distance 1516 feet     Walk Time 6 minutes      # of Rest Breaks 0     MPH 2.87     METS 3.19     RPE 11     VO2 Peak 11.18     Symptoms No     Resting HR 65 bpm     Resting BP 122/62     Max Ex. HR 93 bpm     Max Ex. BP 124/72     2 Minute Post BP 104/64        Initial Exercise Prescription:     Initial Exercise Prescription - 10/14/16 1200      Date of Initial Exercise RX and Referring Provider   Date 10/14/16   Referring Provider Kirk Ruths MD     Treadmill   MPH 2.5   Grade 1   Minutes 10   METs 2.91     Bike   Level 0.7   Minutes 10   METs 2.55     NuStep   Level 3   Minutes 10   METs 2     Prescription Details   Frequency (times per week) 3   Duration Progress to 30 minutes of continuous aerobic without signs/symptoms of physical distress     Intensity   THRR 40-80% of Max Heartrate 60-121   Ratings of Perceived Exertion 11-13     Resistance Training   Training Prescription Yes   Weight 2lbs   Reps 10-12      Perform Capillary Blood Glucose checks as needed.  Exercise Prescription Changes:   Exercise Comments:   Discharge Exercise Prescription (Final Exercise Prescription Changes):   Nutrition:  Target Goals: Understanding of nutrition guidelines, daily intake of sodium '1500mg'$ , cholesterol '200mg'$ , calories 30% from fat and 7% or less from saturated fats, daily to have 5 or more servings of fruits and vegetables.  Biometrics:     Pre Biometrics - 10/14/16 1258      Pre Biometrics   Waist Circumference 39 inches   Hip Circumference 41 inches   Waist to Hip Ratio 0.95 %   Triceps Skinfold 21 mm   % Body Fat 28.6 %   Grip Strength 39 kg   Flexibility 0 in   Single Leg Stand 15 seconds       Nutrition Therapy Plan and Nutrition Goals:   Nutrition Discharge: Nutrition Scores:   Nutrition Goals Re-Evaluation:   Psychosocial: Target Goals: Acknowledge presence or absence of depression, maximize coping skills, provide  positive support system. Participant is able  to verbalize types and ability to use techniques and skills needed for reducing stress and depression.  Initial Review & Psychosocial Screening:     Initial Psych Review & Screening - 10/14/16 1609      Initial Review   Current issues with Current Stress Concerns   Source of Stress Concerns Financial   Comments Pt worries about his household finances.  Pt has supportive wife and daughters.      Quality of Life Scores:     Quality of Life - 10/14/16 1252      Quality of Life Scores   Health/Function Pre 26.83 %   Socioeconomic Pre 22.75 %   Psych/Spiritual Pre 24.86 %   Family Pre 28.8 %   GLOBAL Pre 25.79 %      PHQ-9: Recent Review Flowsheet Data    Depression screen [facility] 2/9 09/27/2016 09/10/2016 08/03/2016 12/09/2015 11/27/2015   Decreased Interest 0 0 0 0 0   Down, Depressed, Hopeless 0 0 0 0 0   PHQ - 2 Score 0 0 0 0 0   Altered sleeping - - 0 - -   Tired, decreased energy - - 0 - -   Change in appetite - - 0 - -   Feeling bad or failure about yourself  - - 0 - -   Trouble concentrating - - 0 - -   Moving slowly or fidgety/restless - - 0 - -   Suicidal thoughts - - 0 - -   PHQ-9 Score - - 0 - -      Psychosocial Evaluation and Intervention:     Psychosocial Evaluation - 10/14/16 1611      Psychosocial Evaluation & Interventions   Interventions Stress management education     Discharge Psychosocial Assessment & Intervention   Discharge Continue support measures as needed      Psychosocial Re-Evaluation:   Vocational Rehabilitation: Provide vocational rehab assistance to qualifying candidates.   Vocational Rehab Evaluation & Intervention:     Vocational Rehab - 10/14/16 1612      Initial Vocational Rehab Evaluation & Intervention   Assessment shows need for Vocational Rehabilitation Yes  Pt works part time as a Barista.  Pt is also a Company secretary.      Education: Education Goals: Education classes will be provided on a weekly basis,  covering required topics. Participant will state understanding/return demonstration of topics presented.  Learning Barriers/Preferences:     Learning Barriers/Preferences - 10/14/16 1610      Learning Barriers/Preferences   Learning Barriers None   Learning Preferences Written Material;Audio      Education Topics: Count Your Pulse:  -Group instruction provided by verbal instruction, demonstration, patient participation and written materials to support subject.  Instructors address importance of being able to find your pulse and how to count your pulse when at home without a heart monitor.  Patients get hands on experience counting their pulse with staff help and individually.   Heart Attack, Angina, and Risk Factor Modification:  -Group instruction provided by verbal instruction, video, and written materials to support subject.  Instructors address signs and symptoms of angina and heart attacks.    Also discuss risk factors for heart disease and how to make changes to improve heart health risk factors.   Functional Fitness:  -Group instruction provided by verbal instruction, demonstration, patient participation, and written materials to support subject.  Instructors address safety measures for doing things around the house.  Discuss how to get up and down off the floor, how to pick things up properly, how to safely get out of a chair without assistance, and balance training.   Meditation and Mindfulness:  -Group instruction provided by verbal instruction, patient participation, and written materials to support subject.  Instructor addresses importance of mindfulness and meditation practice to help reduce stress and improve awareness.  Instructor also leads participants through a meditation exercise.    Stretching for Flexibility and Mobility:  -Group instruction provided by verbal instruction, patient participation, and written materials to support subject.  Instructors lead  participants through series of stretches that are designed to increase flexibility thus improving mobility.  These stretches are additional exercise for major muscle groups that are typically performed during regular warm up and cool down.   Hands Only CPR Anytime:  -Group instruction provided by verbal instruction, video, patient participation and written materials to support subject.  Instructors co-teach with AHA video for hands only CPR.  Participants get hands on experience with mannequins.   Nutrition I class: Heart Healthy Eating:  -Group instruction provided by PowerPoint slides, verbal discussion, and written materials to support subject matter. The instructor gives an explanation and review of the Therapeutic Lifestyle Changes diet recommendations, which includes a discussion on lipid goals, dietary fat, sodium, fiber, plant stanol/sterol esters, sugar, and the components of a well-balanced, healthy diet.   Nutrition II class: Lifestyle Skills:  -Group instruction provided by PowerPoint slides, verbal discussion, and written materials to support subject matter. The instructor gives an explanation and review of label reading, grocery shopping for heart health, heart healthy recipe modifications, and ways to make healthier choices when eating out.   Diabetes Question & Answer:  -Group instruction provided by PowerPoint slides, verbal discussion, and written materials to support subject matter. The instructor gives an explanation and review of diabetes co-morbidities, pre- and post-prandial blood glucose goals, pre-exercise blood glucose goals, signs, symptoms, and treatment of hypoglycemia and hyperglycemia, and foot care basics.   Diabetes Blitz:  -Group instruction provided by PowerPoint slides, verbal discussion, and written materials to support subject matter. The instructor gives an explanation and review of the physiology behind type 1 and type 2 diabetes, diabetes medications and  rational behind using different medications, pre- and post-prandial blood glucose recommendations and Hemoglobin A1c goals, diabetes diet, and exercise including blood glucose guidelines for exercising safely.    Portion Distortion:  -Group instruction provided by PowerPoint slides, verbal discussion, written materials, and food models to support subject matter. The instructor gives an explanation of serving size versus portion size, changes in portions sizes over the last 20 years, and what consists of a serving from each food group.   Stress Management:  -Group instruction provided by verbal instruction, video, and written materials to support subject matter.  Instructors review role of stress in heart disease and how to cope with stress positively.     Exercising on Your Own:  -Group instruction provided by verbal instruction, power point, and written materials to support subject.  Instructors discuss benefits of exercise, components of exercise, frequency and intensity of exercise, and end points for exercise.  Also discuss use of nitroglycerin and activating EMS.  Review options of places to exercise outside of rehab.  Review guidelines for sex with heart disease.   Cardiac Drugs I:  -Group instruction provided by verbal instruction and written materials to support subject.  Instructor reviews cardiac drug classes: antiplatelets, anticoagulants, beta blockers, and statins.  Instructor discusses reasons,  side effects, and lifestyle considerations for each drug class.   Cardiac Drugs II:  -Group instruction provided by verbal instruction and written materials to support subject.  Instructor reviews cardiac drug classes: angiotensin converting enzyme inhibitors (ACE-I), angiotensin II receptor blockers (ARBs), nitrates, and calcium channel blockers.  Instructor discusses reasons, side effects, and lifestyle considerations for each drug class.   Anatomy and Physiology of the Circulatory  System:  -Group instruction provided by verbal instruction, video, and written materials to support subject.  Reviews functional anatomy of heart, how it relates to various diagnoses, and what role the heart plays in the overall system.   Knowledge Questionnaire Score:     Knowledge Questionnaire Score - 10/14/16 1247      Knowledge Questionnaire Score   Pre Score 20/24      Core Components/Risk Factors/Patient Goals at Admission:     Personal Goals and Risk Factors at Admission - 10/14/16 0844      Core Components/Risk Factors/Patient Goals on Admission    Weight Management Weight Loss;Yes   Intervention Weight Management: Develop a combined nutrition and exercise program designed to reach desired caloric intake, while maintaining appropriate intake of nutrient and fiber, sodium and fats, and appropriate energy expenditure required for the weight goal.   Expected Outcomes Weight Loss: Understanding of general recommendations for a balanced deficit meal plan, which promotes 1-2 lb weight loss per week and includes a negative energy balance of [id number] kcal/d   Increase Strength and Stamina Yes   Intervention Provide advice, education, support and counseling about physical activity/exercise needs.;Develop an individualized exercise prescription for aerobic and resistive training based on initial evaluation findings, risk stratification, comorbidities and participant's personal goals.   Expected Outcomes Achievement of increased cardiorespiratory fitness and enhanced flexibility, muscular endurance and strength shown through measurements of functional capacity and personal statement of participant.   Diabetes Yes   Intervention Provide education about signs/symptoms and action to take for hypo/hyperglycemia.;Provide education about proper nutrition, including hydration, and aerobic/resistive exercise prescription along with prescribed medications to achieve blood glucose in normal ranges:  Fasting glucose 65-99 mg/dL   Expected Outcomes Short Term: Participant verbalizes understanding of the signs/symptoms and immediate care of hyper/hypoglycemia, proper foot care and importance of medication, aerobic/resistive exercise and nutrition plan for blood glucose control.   Stress Yes   Intervention Offer individual and/or small group education and counseling on adjustment to heart disease, stress management and health-related lifestyle change. Teach and support self-help strategies.;Refer participants experiencing significant psychosocial distress to appropriate mental health specialists for further evaluation and treatment. When possible, include family members and significant others in education/counseling sessions.   Expected Outcomes Short Term: Participant demonstrates changes in health-related behavior, relaxation and other stress management skills, ability to obtain effective social support, and compliance with psychotropic medications if prescribed.;Long Term: Emotional wellbeing is indicated by absence of clinically significant psychosocial distress or social isolation.   Personal Goal Other Yes   Personal Goal Lose 10-15lbs   Intervention individualized exercise prescription and nutritional education   Expected Outcomes Be able to lose weight and maintain weight loss       Core Components/Risk Factors/Patient Goals Review:    Core Components/Risk Factors/Patient Goals at Discharge (Final Review):    ITP Comments:     ITP Comments    Row Name 10/14/16 0840           ITP Comments Fransico Him MD          Comments: Pt in today for cardiac  rehab orientation from 0800 - 1045.  Pt placed on telemetry monitor upon arrival.  As a part of the orientation appointment, pt completed 5 minutes of warm up stretches and 6 minute walk test.  Pt tolerated well with no complaints, monitor showed sr with bbb notched qrs is present on pt most recent 12 lead ekg. Pt excited to start  exercising on next week. Cherre Huger, BSN

## 2016-10-14 NOTE — Progress Notes (Signed)
Cardiac Rehab Medication Review by a Pharmacist  Does the patient  feel that his/her medications are working for him/her?  yes  Has the patient been experiencing any side effects to the medications prescribed?  No   Does the patient measure his/her own blood pressure or blood glucose at home?  yes - patient understands blood pressure goals.   Does the patient have any problems obtaining medications due to transportation or finances?   no  Understanding of regimen: excellent Understanding of indications: good Potential of compliance: excellent  Pharmacist comments: Lance Stevens is a 70 yo male who presented to cardiac rehab today in good spirits. He understands his medications well and seems to be in good communication with his providers. He is currently holding his metformin due to side effect of diarrhea, and will likely restart in about a week at half the dose. He has lost weight recently by eating healthy and having a good lifestyle, and plans to continue this.   Demetrius Charity, PharmD Acute Care Pharmacy Resident  Pager: [phone] 10/14/2016

## 2016-10-15 ENCOUNTER — Encounter (HOSPITAL_COMMUNITY): Payer: Medicare Other

## 2016-10-18 ENCOUNTER — Encounter (HOSPITAL_COMMUNITY)
Admission: RE | Admit: 2016-10-18 | Discharge: 2016-10-18 | Disposition: A | Discharge status: Home / Self Care | Payer: Medicare Other | Source: Ambulatory Visit | Attending: Cardiology | Admitting: Cardiology

## 2016-10-18 ENCOUNTER — Telehealth: Payer: Self-pay

## 2016-10-18 ENCOUNTER — Encounter (HOSPITAL_COMMUNITY): Payer: Self-pay

## 2016-10-18 DIAGNOSIS — Z951 Presence of aortocoronary bypass graft: Secondary | ICD-10-CM

## 2016-10-18 LAB — GLUCOSE, CAPILLARY
Glucose-Capillary: 107 mg/dL — ABNORMAL HIGH (ref 65–99)
Glucose-Capillary: 105 mg/dL — ABNORMAL HIGH (ref 65–99)

## 2016-10-18 NOTE — Telephone Encounter (Signed)
Pt needs a refill on his toprol and lipitor.  He is completely out and says the pharmacy has sent over a refill request.  [phone]

## 2016-10-18 NOTE — Progress Notes (Signed)
Daily Session Note  Patient Details  Name: Lance Stevens MRN: [id number] Date of Birth: 25-Dec-1945 Referring Provider:   Flowsheet Row CARDIAC REHAB PHASE II ORIENTATION from 10/14/2016 in [place]  Referring Provider  Kirk Ruths MD      Encounter Date: 10/18/2016  Check In:     Session Check In - 10/18/16 0826      Check-In   Location [facility]   Staff Present Cleda Mccreedy, MS, Exercise Physiologist;Carlette Wilber Oliphant, RN, BSN;Amber Fair, MS, ACSM RCEP, Exercise Physiologist;Woody Kronberg, RN, BSN;Molly diVincenzo, MS, ACSM RCEP, Exercise Physiologist   Supervising physician immediately available to respond to emergencies [facility] immediately available   Physician(s) Dr. Tana Coast   Medication changes reported     No   Fall or balance concerns reported    No   Warm-up and Cool-down Performed as group-led instruction   Resistance Training Performed Yes   VAD Patient? No     Pain Assessment   Currently in Pain? No/denies   Multiple Pain Sites No      Capillary Blood Glucose: No results found for this or any previous visit (from the past 24 hour(s)).   Goals Met:  Exercise tolerated well  Goals Unmet:  Not Applicable  Comments: Pt started cardiac rehab today.  Pt tolerated light exercise without difficulty. VSS, telemetry-sinus  asymptomatic.  Medication list reconciled. Pt denies barriers to medicaiton compliance.  PSYCHOSOCIAL ASSESSMENT:  PHQ-0. Pt exhibits positive coping skills, hopeful outlook with supportive family.  Pt does c/o current situational stress that includes financial and family concerns.     Pt enjoys reading, exercising  Pt goals for cardiac rehab .   Pt oriented to exercise equipment and routine.    Understanding verbalized.   Dr. Fransico Him is Medical Director for Cardiac Rehab at [facility].

## 2016-10-19 ENCOUNTER — Other Ambulatory Visit: Payer: Self-pay | Admitting: Physician Assistant

## 2016-10-20 ENCOUNTER — Other Ambulatory Visit: Payer: Self-pay | Admitting: Emergency Medicine

## 2016-10-20 ENCOUNTER — Encounter (HOSPITAL_COMMUNITY)
Admission: RE | Admit: 2016-10-20 | Discharge: 2016-10-20 | Disposition: A | Discharge status: Home / Self Care | Payer: Medicare Other | Source: Ambulatory Visit | Attending: Cardiology | Admitting: Cardiology

## 2016-10-20 DIAGNOSIS — Z951 Presence of aortocoronary bypass graft: Secondary | ICD-10-CM

## 2016-10-20 MED ORDER — ATORVASTATIN CALCIUM 80 MG PO TABS: 80.0000 mg | ORAL_TABLET | Freq: Every day | ORAL | 3 refills | Status: DC

## 2016-10-20 MED ORDER — METOPROLOL SUCCINATE ER 50 MG PO TB24: 50.0000 mg | ORAL_TABLET | Freq: Every day | ORAL | 3 refills | Status: DC

## 2016-10-20 NOTE — Telephone Encounter (Signed)
Patient called again about getting these refills on his toparol and lipitor.  Says he has called several times about this and that the pharmacy has requested it a few times also.  Please call [phone]

## 2016-10-20 NOTE — Telephone Encounter (Signed)
Pt aware medication refills e-scribed to pharmacy

## 2016-10-22 ENCOUNTER — Encounter (HOSPITAL_COMMUNITY)
Admission: RE | Admit: 2016-10-22 | Discharge: 2016-10-22 | Disposition: A | Discharge status: Home / Self Care | Payer: Medicare Other | Source: Ambulatory Visit | Attending: Cardiology | Admitting: Cardiology

## 2016-10-22 ENCOUNTER — Other Ambulatory Visit: Payer: Self-pay | Admitting: Physician Assistant

## 2016-10-22 DIAGNOSIS — Z951 Presence of aortocoronary bypass graft: Secondary | ICD-10-CM | POA: Diagnosis not present

## 2016-10-22 NOTE — Progress Notes (Signed)
Reviewed home exercise with pt today.  Pt plans to walk or go to [employer] for exercise.  Reviewed THR, pulse, RPE, sign and symptoms, NTG use, and when to call 911 or MD.  Also discussed weather considerations and indoor options.  Pt voiced understanding.   Thersea Manfredonia [employer]

## 2016-10-25 ENCOUNTER — Encounter (HOSPITAL_COMMUNITY)
Admission: RE | Admit: 2016-10-25 | Discharge: 2016-10-25 | Disposition: A | Discharge status: Home / Self Care | Payer: Medicare Other | Source: Ambulatory Visit | Attending: Cardiology | Admitting: Cardiology

## 2016-10-25 ENCOUNTER — Other Ambulatory Visit: Payer: Self-pay

## 2016-10-25 DIAGNOSIS — Z951 Presence of aortocoronary bypass graft: Secondary | ICD-10-CM | POA: Diagnosis not present

## 2016-10-25 MED ORDER — ATORVASTATIN CALCIUM 80 MG PO TABS: 80.0000 mg | ORAL_TABLET | Freq: Every day | ORAL | 3 refills | Status: DC

## 2016-10-27 ENCOUNTER — Encounter (HOSPITAL_COMMUNITY): Payer: Medicare Other

## 2016-10-27 ENCOUNTER — Telehealth (HOSPITAL_COMMUNITY): Payer: Self-pay | Admitting: Physician Assistant

## 2016-10-29 ENCOUNTER — Encounter (HOSPITAL_COMMUNITY): Payer: Medicare Other

## 2016-11-01 ENCOUNTER — Encounter (HOSPITAL_COMMUNITY)
Admission: RE | Admit: 2016-11-01 | Discharge: 2016-11-01 | Disposition: A | Discharge status: Home / Self Care | Payer: Medicare Other | Source: Ambulatory Visit | Attending: Cardiology | Admitting: Cardiology

## 2016-11-01 ENCOUNTER — Encounter (HOSPITAL_COMMUNITY): Payer: Self-pay

## 2016-11-01 DIAGNOSIS — Z951 Presence of aortocoronary bypass graft: Secondary | ICD-10-CM

## 2016-11-01 NOTE — Progress Notes (Signed)
Discharge Summary  Patient Details  Name: Lance Stevens MRN: [id number] Date of Birth: December 08, 1945 Referring Provider:   Flowsheet Row CARDIAC REHAB PHASE II ORIENTATION from 10/14/2016 in [facility]  Referring Provider  Kirk Ruths MD       Number of Visits: 6  Reason for Discharge:  Early Exit:  Insurance, pt has high copay.  Pt plans to exercise on his own. Pt is interested in attending Heart Healthy Eating and Lifestyle Skills  nutrition classes.    Smoking History:  History  Smoking Status  . Never Smoker  Smokeless Tobacco  . Never Used    Diagnosis:  07/13/16 S/P CABG x 6  ADL UCSD:   Initial Exercise Prescription:     Initial Exercise Prescription - 10/14/16 1200      Date of Initial Exercise RX and Referring Provider   Date 10/14/16   Referring Provider Kirk Ruths MD     Treadmill   MPH 2.5   Grade 1   Minutes 10   METs 2.91     Bike   Level 0.7   Minutes 10   METs 2.55     NuStep   Level 3   Minutes 10   METs 2     Prescription Details   Frequency (times per week) 3   Duration Progress to 30 minutes of continuous aerobic without signs/symptoms of physical distress     Intensity   THRR 40-80% of Max Heartrate 60-121   Ratings of Perceived Exertion 11-13     Resistance Training   Training Prescription Yes   Weight 2lbs   Reps 10-12      Discharge Exercise Prescription (Final Exercise Prescription Changes):   Functional Capacity:     6 Minute Walk    Row Name 10/14/16 1252         6 Minute Walk   Phase Initial     Distance 1516 feet     Walk Time 6 minutes     # of Rest Breaks 0     MPH 2.87     METS 3.19     RPE 11     VO2 Peak 11.18     Symptoms No     Resting HR 65 bpm     Resting BP 122/62     Max Ex. HR 93 bpm     Max Ex. BP 124/72     2 Minute Post BP 104/64        Psychological, QOL, Others - Outcomes: PHQ 2/9: Depression screen [facility] 2/9 11/01/2016 10/18/2016  09/27/2016 09/10/2016 08/03/2016  Decreased Interest 0 0 0 0 0  Down, Depressed, Hopeless 0 0 0 0 0  PHQ - 2 Score 0 0 0 0 0  Altered sleeping - - - - 0  Tired, decreased energy - - - - 0  Change in appetite - - - - 0  Feeling bad or failure about yourself  - - - - 0  Trouble concentrating - - - - 0  Moving slowly or fidgety/restless - - - - 0  Suicidal thoughts - - - - 0  PHQ-9 Score - - - - 0    Quality of Life:     Quality of Life - 10/20/16 1058      Quality of Life Scores   Socioeconomic Pre --  pt concerns are financial due to job insecurity. 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 pt is open  to new job possiblities.  pt reminded to return voc rehab packet.  pt accepting of offer to participate in voc rehab.       Personal Goals: Goals established at orientation with interventions provided to work toward goal.     Personal Goals and Risk Factors at Admission - 10/14/16 0844      Core Components/Risk Factors/Patient Goals on Admission    Weight Management Weight Loss;Yes   Intervention Weight Management: Develop a combined nutrition and exercise program designed to reach desired caloric intake, while maintaining appropriate intake of nutrient and fiber, sodium and fats, and appropriate energy expenditure required for the weight goal.   Expected Outcomes Weight Loss: Understanding of general recommendations for a balanced deficit meal plan, which promotes 1-2 lb weight loss per week and includes a negative energy balance of [phone] kcal/d   Increase Strength and Stamina Yes   Intervention Provide advice, education, support and counseling about physical activity/exercise needs.;Develop an individualized exercise prescription for aerobic and resistive training based on initial evaluation findings, risk stratification, comorbidities and participant's personal goals.   Expected Outcomes Achievement of increased cardiorespiratory fitness and enhanced  flexibility, muscular endurance and strength shown through measurements of functional capacity and personal statement of participant.   Diabetes Yes   Intervention Provide education about signs/symptoms and action to take for hypo/hyperglycemia.;Provide education about proper nutrition, including hydration, and aerobic/resistive exercise prescription along with prescribed medications to achieve blood glucose in normal ranges: Fasting glucose 65-99 mg/dL   Expected Outcomes Short Term: Participant verbalizes understanding of the signs/symptoms and immediate care of hyper/hypoglycemia, proper foot care and importance of medication, aerobic/resistive exercise and nutrition plan for blood glucose control.   Stress Yes   Intervention Offer individual and/or small group education and counseling on adjustment to heart disease, stress management and health-related lifestyle change. Teach and support self-help strategies.;Refer participants experiencing significant psychosocial distress to appropriate mental health specialists for further evaluation and treatment. When possible, include family members and significant others in education/counseling sessions.   Expected Outcomes Short Term: Participant demonstrates changes in health-related behavior, relaxation and other stress management skills, ability to obtain effective social support, and compliance with psychotropic medications if prescribed.;Long Term: Emotional wellbeing is indicated by absence of clinically significant psychosocial distress or social isolation.   Personal Goal Other Yes   Personal Goal Lose 10-15lbs   Intervention individualized exercise prescription and nutritional education   Expected Outcomes Be able to lose weight and maintain weight loss        Personal Goals Discharge:   Nutrition & Weight - Outcomes:     Pre Biometrics - 10/14/16 1258      Pre Biometrics   Waist Circumference 39 inches   Hip Circumference 41 inches    Waist to Hip Ratio 0.95 %   Triceps Skinfold 21 mm   % Body Fat 28.6 %   Grip Strength 39 kg   Flexibility 0 in   Single Leg Stand 15 seconds       Nutrition:     Nutrition Therapy & Goals - 11/01/16 0842      Nutrition Therapy   Diet Carb Modified, Therapeutic Lifestyle Change     Intervention Plan   Intervention Prescribe, educate and counsel regarding individualized specific dietary modifications aiming towards targeted core components such as weight, hypertension, lipid management, diabetes, heart failure and other comorbidities.   Expected Outcomes Short Term Goal: Understand basic principles of dietary content, such as calories, fat, sodium, cholesterol and nutrients.;Long Term  Goal: Adherence to prescribed nutrition plan.      Nutrition Discharge:     Nutrition Assessments - 11/01/16 1048      MEDFICTS Scores   Pre Score 54      Education Questionnaire Score:     Knowledge Questionnaire Score - 10/14/16 1247      Knowledge Questionnaire Score   Pre Score 20/24      Goals reviewed with patient; copy given to patient.

## 2016-11-03 ENCOUNTER — Encounter (HOSPITAL_COMMUNITY): Payer: Medicare Other

## 2016-11-05 ENCOUNTER — Encounter (HOSPITAL_COMMUNITY): Payer: Medicare Other

## 2016-11-08 ENCOUNTER — Encounter (HOSPITAL_COMMUNITY): Admission: RE | Admit: 2016-11-08 | Payer: Medicare Other | Source: Ambulatory Visit

## 2016-11-10 ENCOUNTER — Encounter (HOSPITAL_COMMUNITY): Payer: Medicare Other

## 2016-11-10 NOTE — Progress Notes (Signed)
HPI: Follow-up coronary artery disease. Chest CT March 2017 showed mildly dilated aortic root at 4 cm. Patient admitted with chest pain August 2017. Cardiac catheterization revealed three-vessel coronary disease. He underwent coronary artery bypass and graft with a LIMA to the LAD, saphenous vein graft to the diagonal, sequential saphenous vein graft to the intermediate and circumflex and sequential saphenous vein graft to the distal right coronary artery and posterior lateral. Patient did have postoperative atrial fibrillation which was treated with amiodarone. Echocardiogram August 2017 showed normal LV function with mild aortic insufficiency. Also note preoperative carotid Dopplers showed 1-39% bilateral stenosis. Since last seen, the patient denies any dyspnea on exertion, orthopnea, PND, pedal edema, palpitations, syncope or chest pain.   Current Outpatient Prescriptions  Medication Sig Dispense Refill  . aspirin EC 325 MG EC tablet Take 1 tablet (325 mg total) by mouth daily. 30 tablet 0  . atorvastatin (LIPITOR) 80 MG tablet Take 1 tablet (80 mg total) by mouth daily at 6 PM. 90 tablet 3  . blood glucose meter kit and supplies KIT Dispense based on patient and insurance preference. Use up to four times daily as directed. (FOR ICD-9 250.00, 250.01). 1 each 0  . metFORMIN (GLUCOPHAGE) 500 MG tablet Take 1 tablet (500 mg total) by mouth 2 (two) times daily with a meal. 60 tablet 1  . metoprolol succinate (TOPROL-XL) 50 MG 24 hr tablet Take 1 tablet (50 mg total) by mouth daily. 90 tablet 3   No current facility-administered medications for this visit.      Past Medical History:  Diagnosis Date  . Allergy   . Anginal pain ([place])   . Borderline diabetes   . Bronchitis    recurrent  . CAD in native artery    a. Multivessel CAD by cath 07/2016 - tx with medical therapy with consideration of CABG for refractory angina.  . Essential hypertension   . Fracture, finger    LEFT HAND WITH  SWELLING AND BRUISING  . Heart murmur    ? teen  . History of gout   . History of kidney stones   . History of kidney stones   . History of skin cancer    possible - pt not sure of dx  . Hyperlipidemia   . Pneumonia    hx  . Prostate cancer [facility])    a. s/p radical prostatectomy 04/2016.  Marland Kitchen Sinus infection    recurrent    Past Surgical History:  Procedure Laterality Date  . CARDIAC CATHETERIZATION N/A 07/12/2016   Procedure: Left Heart Cath and Coronary Angiography;  Surgeon: Peter M Martinique, MD;  Location: [facility];  Service: Cardiovascular;  Laterality: N/A;  . CARDIAC SURGERY    . CORONARY ARTERY BYPASS GRAFT N/A 07/23/2016   Procedure: CORONARY ARTERY BYPASS GRAFTING (CABG), ON PUMP, TIMES SIX, USING LEFT INTERNAL MAMMARY ARTERY, RIGHT GREATER SAPHENOUS VEIN HARVESTED ENDOSCOPICALLY;  Surgeon: Grace Isaac, MD;  Location: [facility];  Service: Open Heart Surgery;  Laterality: N/A;  LIMA to LAD, SVG to DIAG, SEQ SVG to INTERMEDIATE and CIRC, SVG to DISTAL RIGHT, SVG to MID POSTERIOR LATERAL.  Marland Kitchen EYE SURGERY     B cataracts removed.  Marland Kitchen EYE SURGERY     lens implants  . LITHOTRIPSY     89  . LYMPHADENECTOMY Bilateral 04/05/2016   Procedure: LYMPHADENECTOMY, BILATERAL PELVIC LYMPHADENECTOMY;  Surgeon: Raynelle Bring, MD;  Location: [facility];  Service: Urology;  Laterality: Bilateral;  . PROSTATE  BIOPSY    . ROBOT ASSISTED LAPAROSCOPIC RADICAL PROSTATECTOMY N/A 04/05/2016   Procedure: XI ROBOTIC ASSISTED LAPAROSCOPIC RADICAL PROSTATECTOMY LEVEL 2;  Surgeon: Raynelle Bring, MD;  Location: [facility];  Service: Urology;  Laterality: N/A;  . TEE WITHOUT CARDIOVERSION N/A 07/23/2016   Procedure: TRANSESOPHAGEAL ECHOCARDIOGRAM (TEE);  Surgeon: Grace Isaac, MD;  Location: [facility];  Service: Open Heart Surgery;  Laterality: N/A;  . TONSILLECTOMY AND ADENOIDECTOMY     also received radiation treatments, as was frequent in children at the time  . WISDOM TOOTH EXTRACTION      Social History     Social History  . Marital status: Married    Spouse name: [name]  . Number of children: 3  . Years of education: N/A   Occupational History  . 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 Smoking status: Never Smoker  . Smokeless tobacco: Never Used  . Alcohol use 1.2 oz/week    2 Standard drinks or equivalent per week     Comment: wine - red 2 glasses wine per week  . Drug use: No  . Sexual activity: Yes    Partners: Female   Other Topics Concern  . Not on file   Social History Narrative   Marital status: married x 25 years; second marriage; first marriage x 12 years.      Children: 3 daughters and one step-daughter; 4 grandchildren      Lives: with wife.      Employment: semi-retired; Company secretary; professor; Social worker, Probation officer.      Tobacco:  no      Alcohol: about 2 glasses of wine/week      Exercise: active in the yard.    Family History  Problem Relation Age of Onset  . Heart attack Father   . Heart disease Father 15    AMI as cause of death  . Diabetes Father   . Hyperlipidemia Father   . Parkinson's disease Brother   . Diabetes Brother   . Cancer Brother     melanoma retina  . Heart disease Brother   . Stroke Maternal Grandmother   . Cancer Mother 41    Hodgkins   . Glaucoma Mother   . Colon cancer Neg Hx   . Colon polyps Neg Hx     ROS: no fevers or chills, productive cough, hemoptysis, dysphasia, odynophagia, melena, hematochezia, dysuria, hematuria, rash, seizure activity, orthopnea, PND, pedal edema, claudication. Remaining systems are negative.  Physical Exam: Well-developed well-nourished in no acute distress.  Skin is warm and dry.  HEENT is normal.  Neck is supple.  Chest is clear to auscultation with normal expansion.  Cardiovascular exam is regular rate and rhythm.  Abdominal exam nontender or distended. No masses palpated. Bruit noted. Extremities show no edema. neuro  grossly intact  ECG-08/16/2016-sinus rhythm, right bundle branch block, inferior infarct.  A/P  1 coronary artery disease-continue aspirin and statin.  2 thoracic aortic aneurysm-we will plan repeat CTA March 2018. Patient also smoked as a young man. I will therefore scan his abdomen at the same time to rule out abdominal aortic aneurysm. He does have a bruit.  3 hyperlipidemia-continue statin.  4 hypertension-blood pressure controlled. Continue present medications.  Kirk Ruths, MD

## 2016-11-12 ENCOUNTER — Encounter (HOSPITAL_COMMUNITY): Payer: Medicare Other

## 2016-11-15 ENCOUNTER — Encounter (HOSPITAL_COMMUNITY)
Admission: RE | Admit: 2016-11-15 | Discharge: 2016-11-15 | Disposition: A | Discharge status: Home / Self Care | Payer: Medicare Other | Source: Ambulatory Visit | Attending: Cardiology | Admitting: Cardiology

## 2016-11-15 ENCOUNTER — Ambulatory Visit (INDEPENDENT_AMBULATORY_CARE_PROVIDER_SITE_OTHER): Payer: Medicare Other | Admitting: Cardiology

## 2016-11-15 ENCOUNTER — Encounter: Payer: Self-pay | Admitting: Cardiology

## 2016-11-15 VITALS — BP 122/60 | HR 68 | Ht 69.0 in | Wt 192.0 lb

## 2016-11-15 DIAGNOSIS — E785 Hyperlipidemia, unspecified: Secondary | ICD-10-CM | POA: Insufficient documentation

## 2016-11-15 DIAGNOSIS — Z7984 Long term (current) use of oral hypoglycemic drugs: Secondary | ICD-10-CM | POA: Insufficient documentation

## 2016-11-15 DIAGNOSIS — I10 Essential (primary) hypertension: Secondary | ICD-10-CM | POA: Diagnosis not present

## 2016-11-15 DIAGNOSIS — I2510 Atherosclerotic heart disease of native coronary artery without angina pectoris: Secondary | ICD-10-CM | POA: Insufficient documentation

## 2016-11-15 DIAGNOSIS — R0989 Other specified symptoms and signs involving the circulatory and respiratory systems: Secondary | ICD-10-CM

## 2016-11-15 DIAGNOSIS — Z87442 Personal history of urinary calculi: Secondary | ICD-10-CM | POA: Insufficient documentation

## 2016-11-15 DIAGNOSIS — E7800 Pure hypercholesterolemia, unspecified: Secondary | ICD-10-CM

## 2016-11-15 DIAGNOSIS — Z79899 Other long term (current) drug therapy: Secondary | ICD-10-CM | POA: Insufficient documentation

## 2016-11-15 DIAGNOSIS — Z951 Presence of aortocoronary bypass graft: Secondary | ICD-10-CM | POA: Insufficient documentation

## 2016-11-15 DIAGNOSIS — Z7982 Long term (current) use of aspirin: Secondary | ICD-10-CM | POA: Insufficient documentation

## 2016-11-15 DIAGNOSIS — R7303 Prediabetes: Secondary | ICD-10-CM | POA: Insufficient documentation

## 2016-11-15 DIAGNOSIS — Z8546 Personal history of malignant neoplasm of prostate: Secondary | ICD-10-CM | POA: Insufficient documentation

## 2016-11-15 NOTE — Patient Instructions (Signed)
Your physician wants you to follow-up in: 6 MONTHS WITH DR CRENSHAW You will receive a reminder letter in the mail two months in advance. If you don't receive a letter, please call our office to schedule the follow-up appointment.   If you need a refill on your cardiac medications before your next appointment, please call your pharmacy.  

## 2016-11-17 ENCOUNTER — Encounter (HOSPITAL_COMMUNITY): Admission: RE | Admit: 2016-11-17 | Payer: Medicare Other | Source: Ambulatory Visit

## 2016-11-19 ENCOUNTER — Encounter (HOSPITAL_COMMUNITY): Payer: Medicare Other

## 2016-11-22 ENCOUNTER — Encounter (HOSPITAL_COMMUNITY): Payer: Medicare Other

## 2016-11-24 ENCOUNTER — Encounter (HOSPITAL_COMMUNITY): Admission: RE | Admit: 2016-11-24 | Payer: Medicare Other | Source: Ambulatory Visit

## 2016-11-26 ENCOUNTER — Encounter (HOSPITAL_COMMUNITY): Payer: Medicare Other

## 2016-11-29 ENCOUNTER — Encounter (HOSPITAL_COMMUNITY): Payer: Medicare Other

## 2016-11-30 ENCOUNTER — Other Ambulatory Visit: Payer: Self-pay | Admitting: *Deleted

## 2016-11-30 ENCOUNTER — Telehealth: Payer: Self-pay | Admitting: Cardiology

## 2016-11-30 DIAGNOSIS — I716 Thoracoabdominal aortic aneurysm, without rupture, unspecified: Secondary | ICD-10-CM

## 2016-11-30 DIAGNOSIS — Z136 Encounter for screening for cardiovascular disorders: Secondary | ICD-10-CM

## 2016-11-30 DIAGNOSIS — Z79899 Other long term (current) drug therapy: Secondary | ICD-10-CM

## 2016-11-30 NOTE — Telephone Encounter (Signed)
From 12/11/ notes: 2 thoracic aortic aneurysm-we will plan repeat CTA March 2018. Patient also smoked as a young man. I will therefore scan his abdomen at the same time to rule out abdominal aortic aneurysm. He does have a bruit. ---------------------------  Pt notes over holiday weekend he "ate intemperately and had some abdominal discomfort", also back pain, made him nervous about the discussion per last OV about r/o of AAA. wanted to see if OK to go ahead and schedule the March CT as soon as possible. pt aware I will route to Dr. Stanford Breed for review - will contact patient w recommendations.

## 2016-11-30 NOTE — Telephone Encounter (Signed)
New message    Pt verbalized that he is calling for a ct to be scheduled

## 2016-11-30 NOTE — Telephone Encounter (Signed)
Ok to proceed with scan now [facility]

## 2016-11-30 NOTE — Telephone Encounter (Signed)
Pt aware of recommendations, orders entered. msg sent to scheduling.

## 2016-12-01 ENCOUNTER — Encounter (HOSPITAL_COMMUNITY): Payer: Medicare Other

## 2016-12-03 ENCOUNTER — Encounter (HOSPITAL_COMMUNITY)
Admission: RE | Admit: 2016-12-03 | Discharge: 2016-12-03 | Disposition: A | Discharge status: Home / Self Care | Payer: Medicare Other | Source: Ambulatory Visit | Attending: Cardiology | Admitting: Cardiology

## 2016-12-07 ENCOUNTER — Other Ambulatory Visit: Payer: Medicare Other | Admitting: *Deleted

## 2016-12-07 DIAGNOSIS — Z79899 Other long term (current) drug therapy: Secondary | ICD-10-CM

## 2016-12-07 NOTE — Addendum Note (Signed)
Addended by: Eulis Foster on: 12/07/2016 11:07 AM   Modules accepted: Orders

## 2016-12-08 ENCOUNTER — Encounter (HOSPITAL_COMMUNITY)
Admission: RE | Admit: 2016-12-08 | Discharge: 2016-12-08 | Disposition: A | Discharge status: Home / Self Care | Payer: Medicare Other | Source: Ambulatory Visit | Attending: Cardiology | Admitting: Cardiology

## 2016-12-08 DIAGNOSIS — R7303 Prediabetes: Secondary | ICD-10-CM | POA: Insufficient documentation

## 2016-12-08 DIAGNOSIS — Z87442 Personal history of urinary calculi: Secondary | ICD-10-CM | POA: Insufficient documentation

## 2016-12-08 DIAGNOSIS — E785 Hyperlipidemia, unspecified: Secondary | ICD-10-CM | POA: Insufficient documentation

## 2016-12-08 DIAGNOSIS — Z79899 Other long term (current) drug therapy: Secondary | ICD-10-CM | POA: Insufficient documentation

## 2016-12-08 DIAGNOSIS — Z7982 Long term (current) use of aspirin: Secondary | ICD-10-CM | POA: Insufficient documentation

## 2016-12-08 DIAGNOSIS — I2510 Atherosclerotic heart disease of native coronary artery without angina pectoris: Secondary | ICD-10-CM | POA: Insufficient documentation

## 2016-12-08 DIAGNOSIS — I10 Essential (primary) hypertension: Secondary | ICD-10-CM | POA: Insufficient documentation

## 2016-12-08 DIAGNOSIS — Z7984 Long term (current) use of oral hypoglycemic drugs: Secondary | ICD-10-CM | POA: Insufficient documentation

## 2016-12-08 DIAGNOSIS — Z8546 Personal history of malignant neoplasm of prostate: Secondary | ICD-10-CM | POA: Insufficient documentation

## 2016-12-08 DIAGNOSIS — Z951 Presence of aortocoronary bypass graft: Secondary | ICD-10-CM | POA: Insufficient documentation

## 2016-12-08 LAB — BASIC METABOLIC PANEL
BUN/Creatinine Ratio: 14 (ref 10–24)
BUN: 15 mg/dL (ref 8–27)
CO2: 26 mmol/L (ref 18–29)
Calcium: 9.5 mg/dL (ref 8.6–10.2)
Chloride: 104 mmol/L (ref 96–106)
Creatinine, Ser: 1.08 mg/dL (ref 0.76–1.27)
GFR calc Af Amer: 81 mL/min/{1.73_m2} (ref >59)
GFR calc non Af Amer: 70 mL/min/{1.73_m2} (ref >59)
GLUCOSE: 126 mg/dL — AB (ref 65–99)
POTASSIUM: 4.6 mmol/L (ref 3.5–5.2)
SODIUM: 143 mmol/L (ref 134–144)

## 2016-12-09 ENCOUNTER — Other Ambulatory Visit: Payer: Self-pay | Admitting: Cardiology

## 2016-12-09 ENCOUNTER — Ambulatory Visit (INDEPENDENT_AMBULATORY_CARE_PROVIDER_SITE_OTHER)
Admission: RE | Admit: 2016-12-09 | Discharge: 2016-12-09 | Disposition: A | Discharge status: Home / Self Care | Payer: Medicare Other | Source: Ambulatory Visit | Attending: Cardiology | Admitting: Cardiology

## 2016-12-09 DIAGNOSIS — Z136 Encounter for screening for cardiovascular disorders: Secondary | ICD-10-CM

## 2016-12-09 DIAGNOSIS — I716 Thoracoabdominal aortic aneurysm, without rupture, unspecified: Secondary | ICD-10-CM

## 2016-12-09 MED ORDER — IOPAMIDOL (ISOVUE-370) INJECTION 76%
100.0000 mL | Freq: Once | INTRAVENOUS | Status: AC | PRN
Start: 2016-12-09 — End: 2016-12-09
  Administered 2016-12-09: 100 mL via INTRAVENOUS

## 2016-12-10 ENCOUNTER — Encounter (HOSPITAL_COMMUNITY): Payer: Medicare Other

## 2016-12-13 ENCOUNTER — Encounter (HOSPITAL_COMMUNITY): Payer: Medicare Other

## 2016-12-14 ENCOUNTER — Other Ambulatory Visit: Payer: Medicare Other

## 2016-12-15 ENCOUNTER — Encounter (HOSPITAL_COMMUNITY): Payer: Medicare Other

## 2016-12-17 ENCOUNTER — Encounter (HOSPITAL_COMMUNITY): Payer: Medicare Other

## 2016-12-20 ENCOUNTER — Encounter (HOSPITAL_COMMUNITY): Payer: Medicare Other

## 2016-12-22 ENCOUNTER — Encounter (HOSPITAL_COMMUNITY): Payer: Medicare Other

## 2016-12-24 ENCOUNTER — Encounter (HOSPITAL_COMMUNITY): Payer: Medicare Other

## 2016-12-27 ENCOUNTER — Encounter (HOSPITAL_COMMUNITY): Payer: Medicare Other

## 2016-12-29 ENCOUNTER — Encounter (HOSPITAL_COMMUNITY): Payer: Medicare Other

## 2016-12-31 ENCOUNTER — Encounter (HOSPITAL_COMMUNITY): Payer: Medicare Other

## 2017-01-03 ENCOUNTER — Encounter (HOSPITAL_COMMUNITY): Payer: Medicare Other

## 2017-01-05 ENCOUNTER — Encounter (HOSPITAL_COMMUNITY): Payer: Medicare Other

## 2017-01-07 ENCOUNTER — Encounter (HOSPITAL_COMMUNITY): Payer: Medicare Other

## 2017-01-10 ENCOUNTER — Encounter (HOSPITAL_COMMUNITY): Payer: Medicare Other

## 2017-01-12 ENCOUNTER — Encounter (HOSPITAL_COMMUNITY): Payer: Medicare Other

## 2017-01-14 ENCOUNTER — Encounter (HOSPITAL_COMMUNITY): Payer: Medicare Other

## 2017-01-17 ENCOUNTER — Encounter (HOSPITAL_COMMUNITY): Payer: Medicare Other

## 2017-01-19 ENCOUNTER — Encounter (HOSPITAL_COMMUNITY): Payer: Medicare Other

## 2017-01-21 ENCOUNTER — Encounter (HOSPITAL_COMMUNITY): Payer: Medicare Other

## 2017-01-21 IMAGING — CR DG CHEST 2V
2 series · 2 of 2 positions shown · non-contrast
Comparison: August 05, 2016

CLINICAL DATA: Status post coronary artery bypass grafting for
coronary artery disease. Hypertension.

EXAM:
CHEST  2 VIEW

[w chest pa]
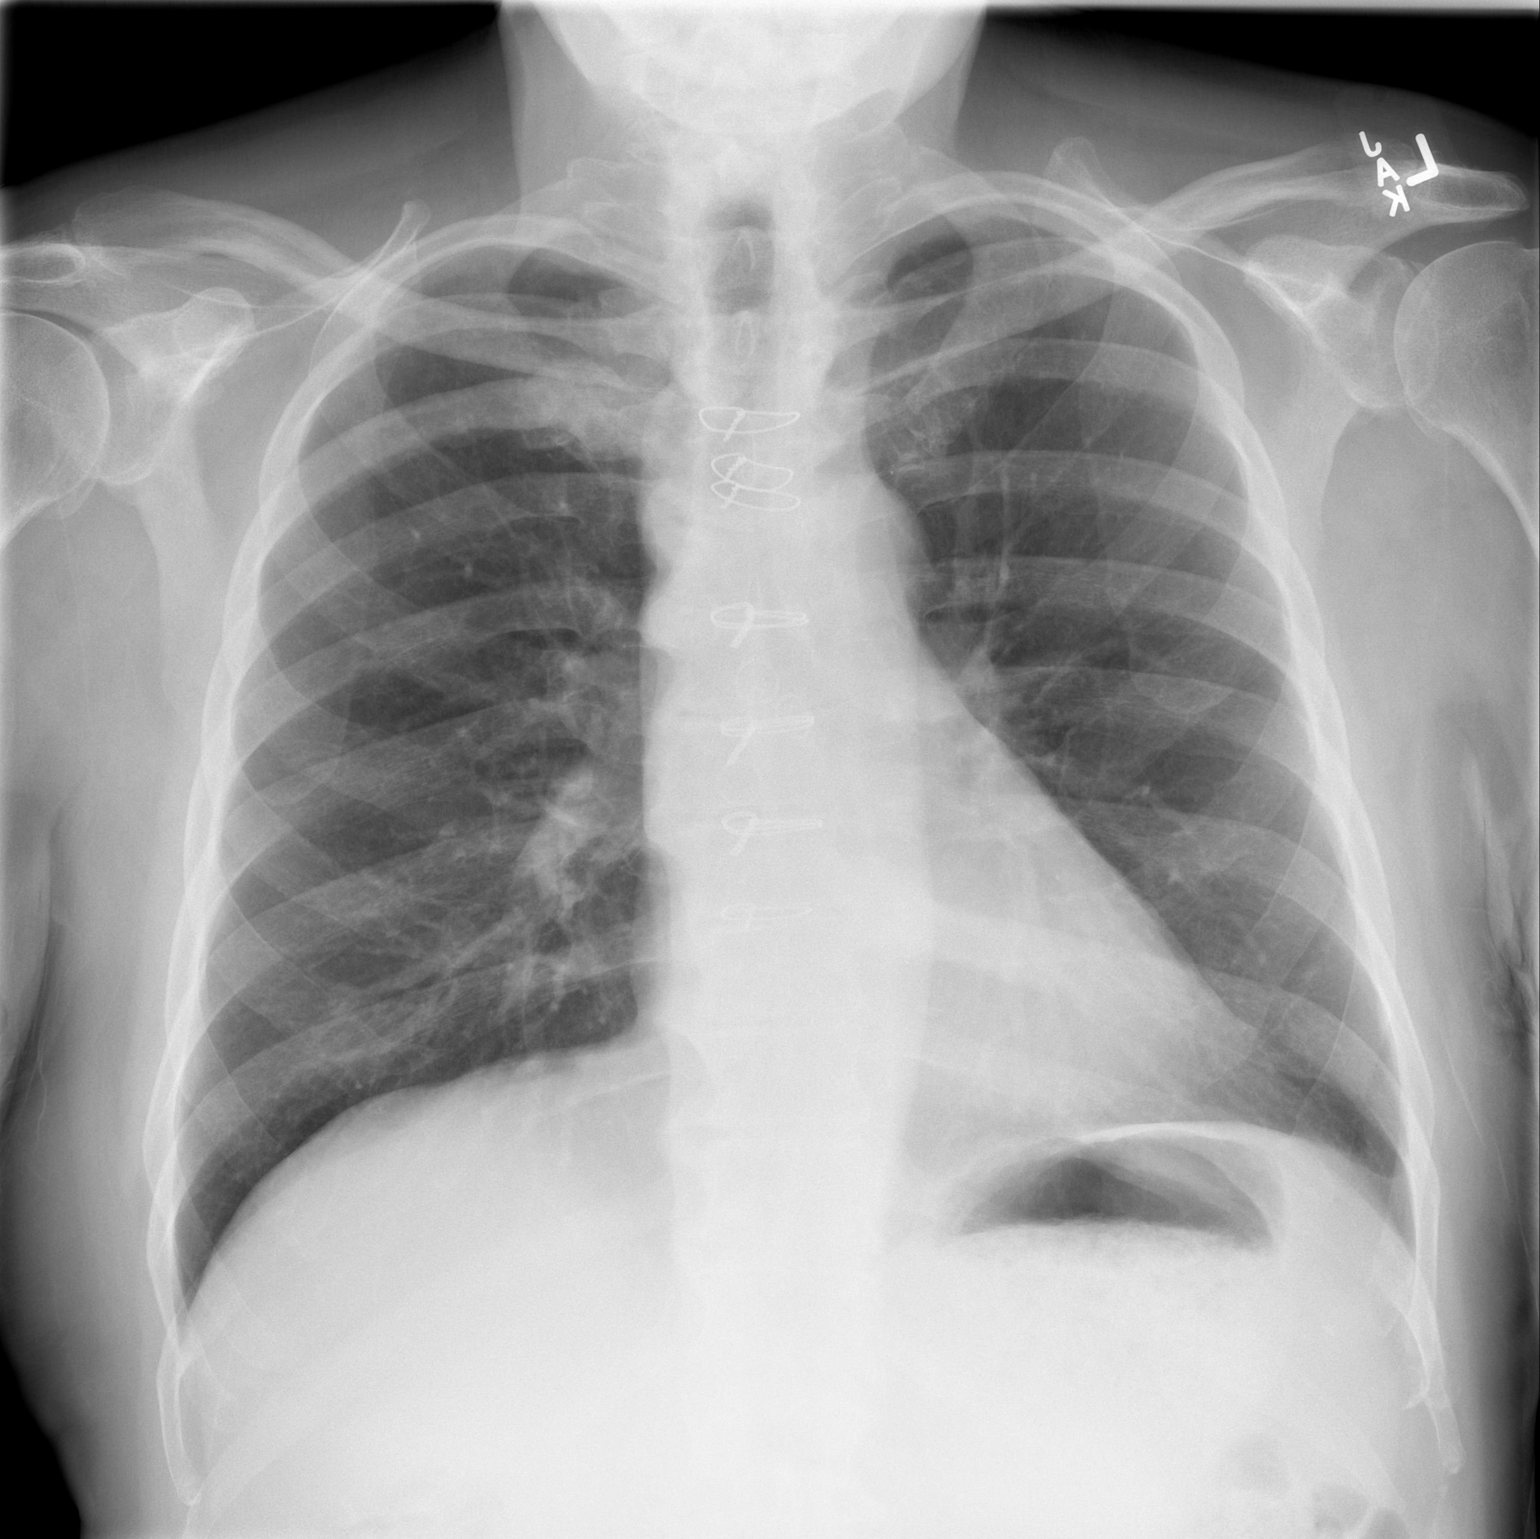

[w chest lat]
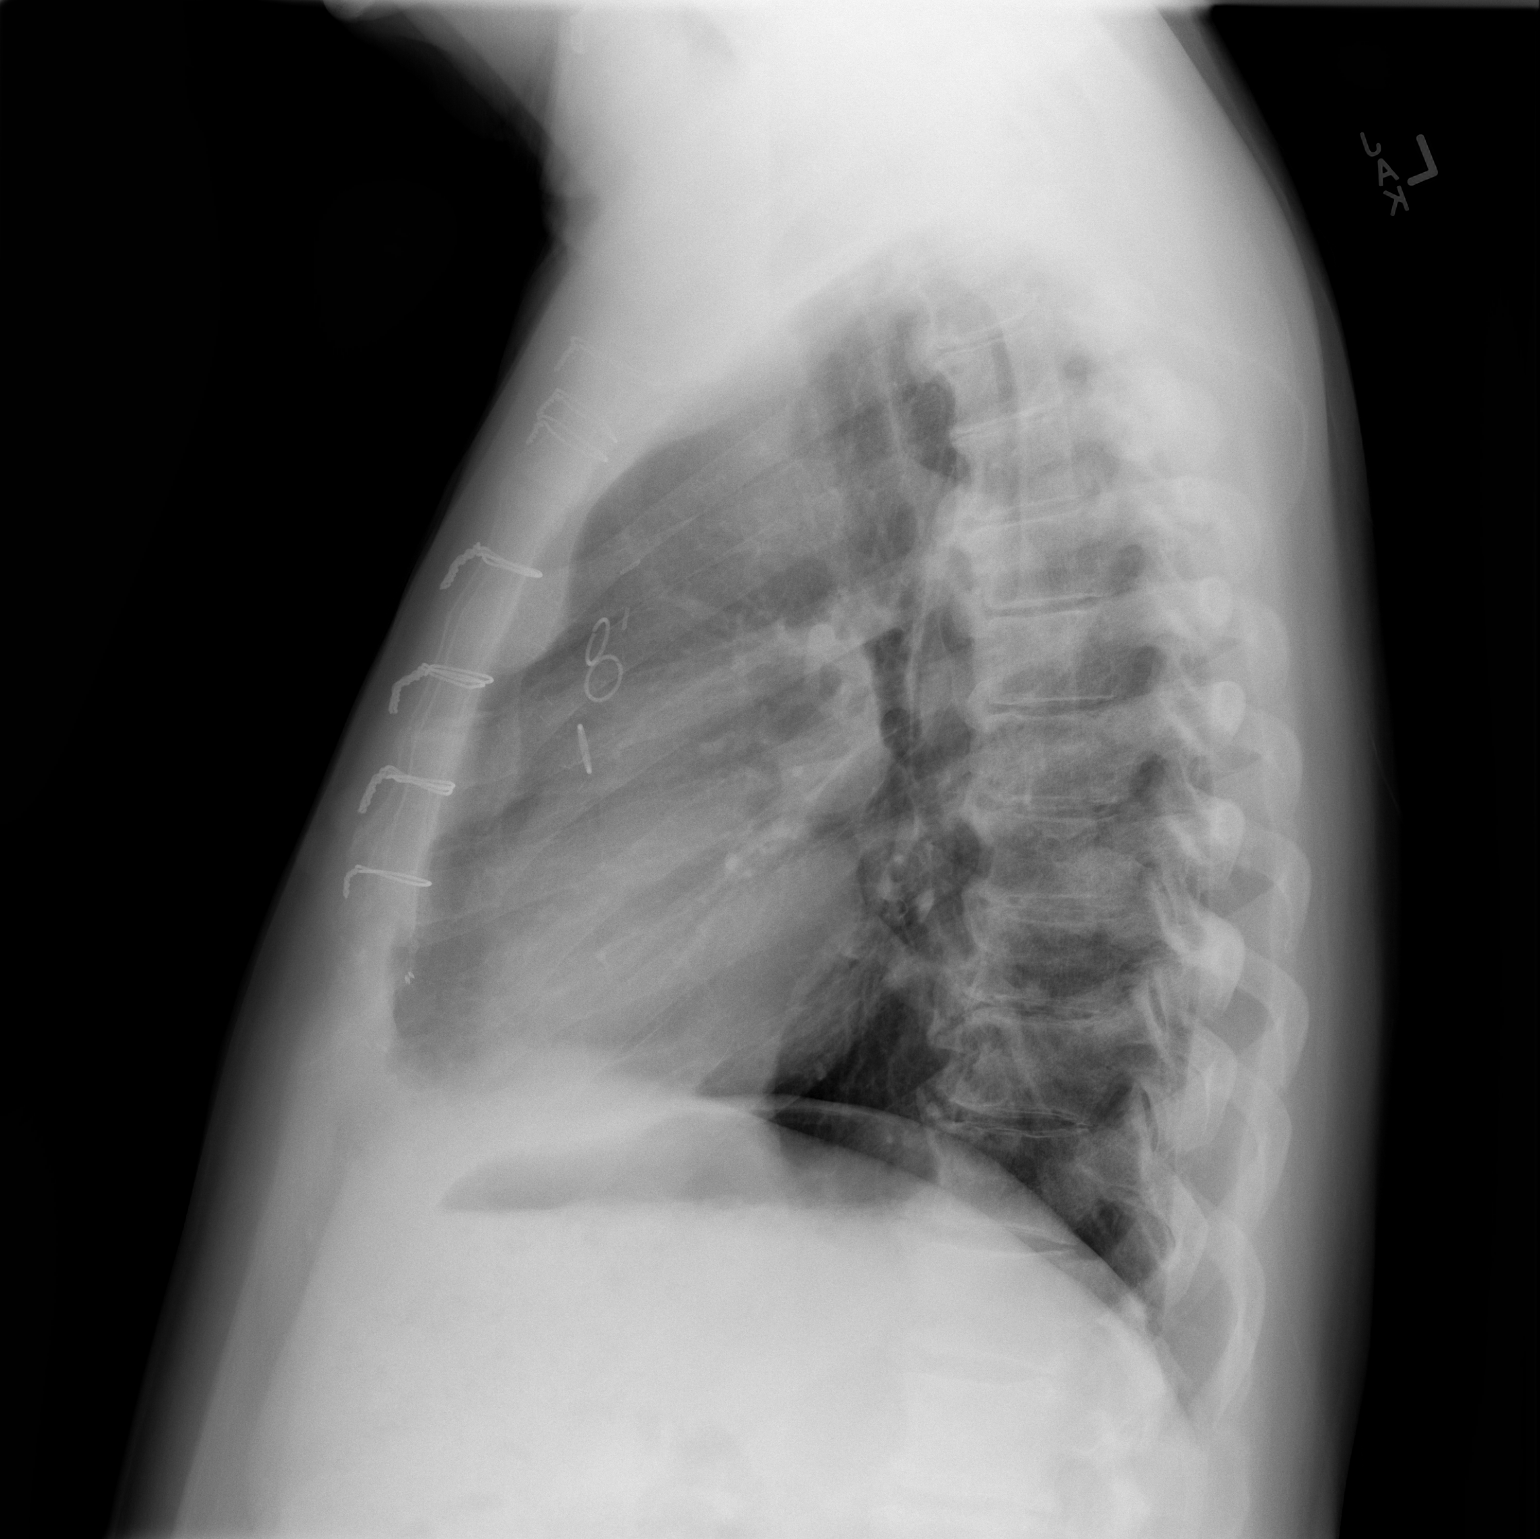

[2 of 2 positions shown; findings below may reference images not displayed]

FINDINGS: There is no edema or consolidation. Heart size and pulmonary
vascularity are normal. No adenopathy. Patient is status post
coronary artery bypass grafting. No pneumothorax. There is
degenerative change in the thoracic spine.
IMPRESSION: No edema or consolidation.  Cardiac silhouette within normal limits.

## 2017-01-24 ENCOUNTER — Encounter (HOSPITAL_COMMUNITY): Payer: Medicare Other

## 2017-01-31 ENCOUNTER — Other Ambulatory Visit: Payer: Medicare Other

## 2017-02-07 ENCOUNTER — Other Ambulatory Visit: Payer: Medicare Other

## 2017-10-29 ENCOUNTER — Other Ambulatory Visit: Payer: Self-pay

## 2017-10-29 ENCOUNTER — Encounter (HOSPITAL_COMMUNITY): Payer: Self-pay | Admitting: Emergency Medicine

## 2017-10-29 ENCOUNTER — Emergency Department (HOSPITAL_COMMUNITY): Payer: Medicare Other

## 2017-10-29 ENCOUNTER — Telehealth: Payer: Self-pay | Admitting: Physician Assistant

## 2017-10-29 ENCOUNTER — Emergency Department (HOSPITAL_COMMUNITY)
Admission: EM | Admit: 2017-10-29 | Discharge: 2017-10-29 | Disposition: A | Discharge status: Home / Self Care | Payer: Medicare Other | Attending: Emergency Medicine | Admitting: Emergency Medicine

## 2017-10-29 DIAGNOSIS — Z79899 Other long term (current) drug therapy: Secondary | ICD-10-CM | POA: Insufficient documentation

## 2017-10-29 DIAGNOSIS — I2510 Atherosclerotic heart disease of native coronary artery without angina pectoris: Secondary | ICD-10-CM | POA: Diagnosis not present

## 2017-10-29 DIAGNOSIS — Z7984 Long term (current) use of oral hypoglycemic drugs: Secondary | ICD-10-CM | POA: Insufficient documentation

## 2017-10-29 DIAGNOSIS — R0789 Other chest pain: Secondary | ICD-10-CM | POA: Diagnosis present

## 2017-10-29 DIAGNOSIS — N182 Chronic kidney disease, stage 2 (mild): Secondary | ICD-10-CM | POA: Diagnosis not present

## 2017-10-29 DIAGNOSIS — Z7982 Long term (current) use of aspirin: Secondary | ICD-10-CM | POA: Insufficient documentation

## 2017-10-29 DIAGNOSIS — I129 Hypertensive chronic kidney disease with stage 1 through stage 4 chronic kidney disease, or unspecified chronic kidney disease: Secondary | ICD-10-CM | POA: Insufficient documentation

## 2017-10-29 DIAGNOSIS — E1122 Type 2 diabetes mellitus with diabetic chronic kidney disease: Secondary | ICD-10-CM | POA: Diagnosis not present

## 2017-10-29 DIAGNOSIS — Z951 Presence of aortocoronary bypass graft: Secondary | ICD-10-CM | POA: Diagnosis not present

## 2017-10-29 DIAGNOSIS — R079 Chest pain, unspecified: Secondary | ICD-10-CM

## 2017-10-29 DIAGNOSIS — Z8546 Personal history of malignant neoplasm of prostate: Secondary | ICD-10-CM | POA: Insufficient documentation

## 2017-10-29 LAB — BASIC METABOLIC PANEL
Anion gap: 7 (ref 5–15)
BUN: 18 mg/dL (ref 6–20)
CO2: 26 mmol/L (ref 22–32)
Calcium: 9.2 mg/dL (ref 8.9–10.3)
Chloride: 104 mmol/L (ref 101–111)
Creatinine, Ser: 1.19 mg/dL (ref 0.61–1.24)
GFR calc Af Amer: >60 mL/min (ref >60)
GFR calc non Af Amer: >60 mL/min (ref >60)
Glucose, Bld: 219 mg/dL — ABNORMAL HIGH (ref 65–99)
Potassium: 4.6 mmol/L (ref 3.5–5.1)
Sodium: 137 mmol/L (ref 135–145)

## 2017-10-29 LAB — CBC
HCT: 45.1 % (ref 39.0–52.0)
Hemoglobin: 15.6 g/dL (ref 13.0–17.0)
MCH: 32.3 pg (ref 26.0–34.0)
MCHC: 34.6 g/dL (ref 30.0–36.0)
MCV: 93.4 fL (ref 78.0–100.0)
Platelets: 195 10*3/uL (ref 150–400)
RBC: 4.83 MIL/uL (ref 4.22–5.81)
RDW: 13 % (ref 11.5–15.5)
WBC: 11.5 10*3/uL — ABNORMAL HIGH (ref 4.0–10.5)

## 2017-10-29 LAB — TROPONIN I: Troponin I: <0.03 ng/mL (ref <0.03)

## 2017-10-29 LAB — I-STAT TROPONIN, ED: Troponin i, poc: 0 ng/mL (ref 0.00–0.08)

## 2017-10-29 NOTE — ED Provider Notes (Signed)
[place] EMERGENCY DEPARTMENT Provider Note   CSN: [id number] Arrival date & time: 10/29/17  1149     History   Chief Complaint Chief Complaint  Patient presents with  . Chest Pain    HPI Lance Stevens is a 71 y.o. male.  HPI   71 year old male with chest pain.  Onset around 5 AM this morning.  It awoken him from sleep.  Ache/pressure in the center of his chest.  He took several nitroglycerin.  He thinks it may have helped a little bit but "I am not sure if it was psychological."  Took 325 mg of aspirin.  Pain subsided over the period of a couple hours.  Currently symptom-free.  He denies any other associated symptoms such as dyspnea, diaphoresis or nausea.  He does have a history of CAD status post CABG last year.  He reports he has had no recurrent issues since then.  He does walk up to 2 miles at the gym and does yard work including recently w/o symptoms.     Past Medical History:  Diagnosis Date  . Allergy   . Anginal pain ([place])   . Borderline diabetes   . Bronchitis    recurrent  . CAD in native artery    a. Multivessel CAD by cath 07/2016 - tx with medical therapy with consideration of CABG for refractory angina.  . Essential hypertension   . Fracture, finger    LEFT HAND WITH SWELLING AND BRUISING  . Heart murmur    ? teen  . History of gout   . History of kidney stones   . History of kidney stones   . History of skin cancer    possible - pt not sure of dx  . Hyperlipidemia   . Pneumonia    hx  . Prostate cancer [facility])    a. s/p radical prostatectomy 04/2016.  Marland Kitchen Sinus infection    recurrent    Patient Active Problem List   Diagnosis Date Noted  . Atrial fibrillation with rapid ventricular response ([place]) 07/26/2016  . S/P CABG (coronary artery bypass graft) 07/23/2016  . Essential hypertension 07/17/2016  . CKD (chronic kidney disease), stage II 07/12/2016  . Dyslipidemia 07/12/2016  . CAD (coronary artery disease) 07/12/2016  .  Prostate cancer ([place]) 04/05/2016  . 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 Hypertriglyceridemia 12/11/2015  . Diabetes mellitus type 2 in nonobese ([place]) 05/27/2015  . Need for shingles vaccine 05/27/2015  . Family history of Parkinson's disease 05/27/2015  . History of nephrolithiasis 05/27/2015    Past Surgical History:  Procedure Laterality Date  . CARDIAC CATHETERIZATION N/A 07/12/2016   Procedure: Left Heart Cath and Coronary Angiography;  Surgeon: Peter M Martinique, MD;  Location: [place] CV LAB;  Service: Cardiovascular;  Laterality: N/A;  . CARDIAC SURGERY    . CORONARY ARTERY BYPASS GRAFT N/A 07/23/2016   Procedure: CORONARY ARTERY BYPASS GRAFTING (CABG), ON PUMP, TIMES SIX, USING LEFT INTERNAL MAMMARY ARTERY, RIGHT GREATER SAPHENOUS VEIN HARVESTED ENDOSCOPICALLY;  Surgeon: Grace Isaac, MD;  Location: [place];  Service: Open Heart Surgery;  Laterality: N/A;  LIMA to LAD, SVG to DIAG, SEQ SVG to INTERMEDIATE and CIRC, SVG to DISTAL RIGHT, SVG to MID POSTERIOR LATERAL.  Marland Kitchen EYE SURGERY     B cataracts removed.  Marland Kitchen EYE SURGERY     lens implants  .  LITHOTRIPSY     98  . LYMPHADENECTOMY Bilateral 04/05/2016   Procedure: LYMPHADENECTOMY, BILATERAL PELVIC LYMPHADENECTOMY;  Surgeon: Raynelle Bring, MD;  Location[facility];  Service: Urology;  Laterality: Bilateral;  . PROSTATE BIOPSY    . ROBOT ASSISTED LAPAROSCOPIC RADICAL PROSTATECTOMY N/A 04/05/2016   Procedure: XI ROBOTIC ASSISTED LAPAROSCOPIC RADICAL PROSTATECTOMY LEVEL 2;  Surgeon: Raynelle Bring, MD;  Location[facility];  Service: Urology;  Laterality: N/A;  . TEE WITHOUT CARDIOVERSION N/A 07/23/2016   Procedure: TRANSESOPHAGEAL ECHOCARDIOGRAM (TEE);  Surgeon: Grace Isaac, MD;  Location: [place];  Service: Open Heart Surgery;  Laterality: N/A;  . TONSILLECTOMY AND ADENOIDECTOMY     also received radiation treatments, as was frequent in children at  the time  . WISDOM TOOTH EXTRACTION         Home Medications    Prior to Admission medications   Medication Sig Start Date End Date Taking? Authorizing Provider  aspirin EC 325 MG EC tablet Take 1 tablet (325 mg total) by mouth daily. 07/28/16  Yes Lars Pinks M, PA-C  atorvastatin (LIPITOR) 80 MG tablet Take 1 tablet (80 mg total) by mouth daily at 6 PM. 10/25/16  Yes Jeffery, Chelle, PA-C  blood glucose meter kit and supplies KIT Dispense based on patient and insurance preference. Use up to four times daily as directed. (FOR ICD-9 250.00, 250.01). 08/06/16  Yes Lars Pinks M, PA-C  metoprolol succinate (TOPROL-XL) 50 MG 24 hr tablet Take 1 tablet (50 mg total) by mouth daily. 10/20/16  Yes Jeffery, Chelle, PA-C  metFORMIN (GLUCOPHAGE) 500 MG tablet Take 1 tablet (500 mg total) by mouth 2 (two) times daily with a meal. Patient not taking: Reported on 10/29/2017 07/28/16   Nani Skillern, PA-C    Family History Family History  Problem Relation Age of Onset  . Heart attack Father   . Heart disease Father 63       AMI as cause of death  . Diabetes Father   . Hyperlipidemia Father   . Parkinson's disease Brother   . Diabetes Brother   . Cancer Brother        melanoma retina  . Heart disease Brother   . Stroke Maternal Grandmother   . Cancer Mother 81       Hodgkins   . Glaucoma Mother   . Colon cancer Neg Hx   . Colon polyps Neg Hx     Social History Social History   Tobacco Use  . Smoking status: Never Smoker  . Smokeless tobacco: Never Used  Substance Use Topics  . Alcohol use: Yes    Alcohol/week: 1.2 oz    Types: 2 Standard drinks or equivalent per week    Comment: wine - red 2 glasses wine per week  . Drug use: No     Allergies   Penicillins and Lisinopril   Review of Systems Review of Systems  All systems reviewed and negative, other than as noted in HPI.  Physical Exam Updated Vital Signs BP 136/77 (BP Location: Right Arm)    Pulse 63   Temp 97.9 F (36.6 C) (Oral)   Resp 17   Ht '5\' 10"'  (1.778 m)   Wt 90.7 kg (200 lb)   SpO2 100%   BMI 28.70 kg/m   Physical Exam  Constitutional: He appears well-developed and well-nourished. No distress.  HENT:  Head: Normocephalic and atraumatic.  Eyes: Conjunctivae are normal. Right eye exhibits no discharge. Left eye exhibits no discharge.  Neck: Neck supple.  Cardiovascular: Normal rate, regular rhythm and normal heart sounds. Exam reveals no gallop and no friction rub.  No murmur heard. Pulmonary/Chest: Effort normal and breath sounds normal. No respiratory distress.  Abdominal: Soft. He exhibits no distension. There is no tenderness.  Musculoskeletal: He exhibits no edema or tenderness.  Neurological: He is alert.  Skin: Skin is warm and dry.  Psychiatric: He has a normal mood and affect. His behavior is normal. Thought content normal.  Nursing note and vitals reviewed.    ED Treatments / Results  Labs (all labs ordered are listed, but only abnormal results are displayed) Labs Reviewed  BASIC METABOLIC PANEL - Abnormal; Notable for the following components:      Result Value   Glucose, Bld 219 (*)    All other components within normal limits  CBC - Abnormal; Notable for the following components:   WBC 11.5 (*)    All other components within normal limits  TROPONIN I  I-STAT TROPONIN, ED    EKG  EKG Interpretation  Date/Time:  Saturday October 29 2017 11:53:35 EST Ventricular Rate:  59 PR Interval:  152 QRS Duration: 130 QT Interval:  464 QTC Calculation: 459 R Axis:   -21 Text Interpretation:  Sinus bradycardia Right bundle branch block Non-specific ST-t changes Confirmed by Virgel Manifold [phone]) on 10/29/2017 12:39:21 PM       Radiology Dg Chest 2 View  Result Date: 10/29/2017 CLINICAL DATA:  Chest pain. EXAM: CHEST  2 VIEW COMPARISON:  Radiographs of September 01, 2016. FINDINGS: The heart size and mediastinal contours are within  normal limits. Both lungs are clear. No pneumothorax or pleural effusion is noted. Status post coronary artery bypass graft. The visualized skeletal structures are unremarkable. IMPRESSION: No active cardiopulmonary disease. Electronically Signed   By: Marijo Conception, M.D.   On: 10/29/2017 13:40    Procedures Procedures (including critical care time)  Medications Ordered in ED Medications - No data to display   Initial Impression / Assessment and Plan / ED Course  I have reviewed the triage vital signs and the nursing notes.  Pertinent labs & imaging results that were available during my care of the patient were reviewed by me and considered in my medical decision making (see chart for details).     71 year old male with chest pain.  Asymptomatic when I evaluated him.  His EKG does not appear acutely changed.  Initial troponin is normal.  Symptoms started at 5 AM.  Will repeat.  Anticipate discharge if this remains negative.  Would have him follow-up with his cardiologist.  Doubt PE, dissection or other emergent process otherwise.  Final Clinical Impressions(s) / ED Diagnoses   Final diagnoses:  Chest pain, unspecified type    ED Discharge Orders    None       Virgel Manifold, MD 10/29/17 [phone]

## 2017-10-29 NOTE — ED Notes (Signed)
Pt left AMA prior to discharge.

## 2017-10-29 NOTE — ED Notes (Signed)
Patient transported to X-ray 

## 2017-10-29 NOTE — Telephone Encounter (Signed)
Patient and his wife paged after hour answering service complaining of chest discomfort since 5 AM this morning. So far he has taken 4 nitroglycerin. He says the chest pain is easing off. He had a history of bypass surgery last year. His symptom is concerning, I recommend additional workup in the emergency room at [place], Almyra Deforest PA Pager: [phone]

## 2017-10-29 NOTE — ED Triage Notes (Signed)
Woke up with "ache in chest at 5am-" pt states that he has not had this since his OHS last august,  Pt has taken NTG 4 tabs sl and ASA 325mg  prior to arrival -- pressure in chest is "better" per pt

## 2017-10-29 NOTE — ED Notes (Signed)
ED Provider at bedside. 

## 2017-11-01 ENCOUNTER — Ambulatory Visit: Payer: Medicare Other | Admitting: Cardiology

## 2017-11-01 ENCOUNTER — Encounter: Payer: Self-pay | Admitting: Cardiology

## 2017-11-01 VITALS — BP 114/68 | HR 72 | Ht 69.0 in | Wt 202.0 lb

## 2017-11-01 DIAGNOSIS — I10 Essential (primary) hypertension: Secondary | ICD-10-CM | POA: Diagnosis not present

## 2017-11-01 DIAGNOSIS — E7800 Pure hypercholesterolemia, unspecified: Secondary | ICD-10-CM | POA: Diagnosis not present

## 2017-11-01 DIAGNOSIS — R072 Precordial pain: Secondary | ICD-10-CM

## 2017-11-01 DIAGNOSIS — I712 Thoracic aortic aneurysm, without rupture, unspecified: Secondary | ICD-10-CM

## 2017-11-01 NOTE — Patient Instructions (Signed)
Medication Instructions:   NO CHANGE  Labwork:  Your physician recommends that you return for lab work PRIOR TO EATING  Testing/Procedures:  Non-Cardiac CT Angiography (CTA), is a special type of CT scan that uses a computer to produce multi-dimensional views of major blood vessels throughout the body. In CT angiography, a contrast material is injected through an IV to help visualize the blood vessels CTA OF THE AORTA TO FOLLOW UP THORACIC ANEURYSM IN January   Follow-Up:  Your physician wants you to follow-up in: Lance Stevens will receive a reminder letter in the mail two months in advance. If you don't receive a letter, please call our office to schedule the follow-up appointment.   If you need a refill on your cardiac medications before your next appointment, please call your pharmacy.

## 2017-11-01 NOTE — Progress Notes (Signed)
HPI: Follow-up coronary artery disease. Patient admitted with chest pain August 2017. Cardiac catheterization revealed three-vessel coronary disease. He underwent coronary artery bypass and graft with a LIMA to the LAD, saphenous vein graft to the diagonal, sequential saphenous vein graft to the intermediate and circumflex and sequential saphenous vein graft to the distal right coronary artery and posterior lateral. Patient did have postoperative atrial fibrillation which was treated with amiodarone. Echocardiogram August 2017 showed normal LV function with mild aortic insufficiency. Also note preoperative carotid Dopplers showed 1-39% bilateral stenosis. CTA January 2018 showed dilated ascending aorta at 4 cm. No abdominal aortic aneurysm. There was left renal artery stenosis. Seen in emergency room November 2018 with chest pain. Chest x-ray negative. Troponin was normal. Hemoglobin 15.6. Since last seen, patient denies dyspnea on exertion, orthopnea, PND, pedal edema or syncope. He has not had exertional chest pain. When he was seen in the emergency room he awoke at 5 AM with substernal chest discomfort described as pressure. No associated symptoms and no radiation. Not pleuritic. Lasted 4 hours and resolved.  Current Outpatient Medications  Medication Sig Dispense Refill  . aspirin EC 325 MG EC tablet Take 1 tablet (325 mg total) by mouth daily. 30 tablet 0  . atorvastatin (LIPITOR) 80 MG tablet Take 1 tablet (80 mg total) by mouth daily at 6 PM. 90 tablet 3  . blood glucose meter kit and supplies KIT Dispense based on patient and insurance preference. Use up to four times daily as directed. (FOR ICD-9 250.00, 250.01). 1 each 0  . metoprolol succinate (TOPROL-XL) 50 MG 24 hr tablet Take 1 tablet (50 mg total) by mouth daily. 90 tablet 3   No current facility-administered medications for this visit.      Past Medical History:  Diagnosis Date  . Allergy   . Anginal pain ([place])   .  Borderline diabetes   . Bronchitis    recurrent  . CAD in native artery    a. Multivessel CAD by cath 07/2016 - tx with medical therapy with consideration of CABG for refractory angina.  . Essential hypertension   . Fracture, finger    LEFT HAND WITH SWELLING AND BRUISING  . Heart murmur    ? teen  . History of gout   . History of kidney stones   . History of kidney stones   . History of skin cancer    possible - pt not sure of dx  . Hyperlipidemia   . Pneumonia    hx  . Prostate cancer [facility])    a. s/p radical prostatectomy 04/2016.  Marland Kitchen Sinus infection    recurrent    Past Surgical History:  Procedure Laterality Date  . CARDIAC CATHETERIZATION N/A 07/12/2016   Procedure: Left Heart Cath and Coronary Angiography;  Surgeon: Peter M Martinique, MD;  Location: [place] CV LAB;  Service: Cardiovascular;  Laterality: N/A;  . CARDIAC SURGERY    . CORONARY ARTERY BYPASS GRAFT N/A 07/23/2016   Procedure: CORONARY ARTERY BYPASS GRAFTING (CABG), ON PUMP, TIMES SIX, USING LEFT INTERNAL MAMMARY ARTERY, RIGHT GREATER SAPHENOUS VEIN HARVESTED ENDOSCOPICALLY;  Surgeon: Grace Isaac, MD;  Location: [place];  Service: Open Heart Surgery;  Laterality: N/A;  LIMA to LAD, SVG to DIAG, SEQ SVG to INTERMEDIATE and CIRC, SVG to DISTAL RIGHT, SVG to MID POSTERIOR LATERAL.  Marland Kitchen EYE SURGERY     B cataracts removed.  Marland Kitchen EYE SURGERY     lens implants  . LITHOTRIPSY  98  . LYMPHADENECTOMY Bilateral 04/05/2016   Procedure: LYMPHADENECTOMY, BILATERAL PELVIC LYMPHADENECTOMY;  Surgeon: Raynelle Bring, MD;  [facility];  Service: Urology;  Laterality: Bilateral;  . PROSTATE BIOPSY    . ROBOT ASSISTED LAPAROSCOPIC RADICAL PROSTATECTOMY N/A 04/05/2016   Procedure: XI ROBOTIC ASSISTED LAPAROSCOPIC RADICAL PROSTATECTOMY LEVEL 2;  Surgeon: Raynelle Bring, MD;  [facility];  Service: Urology;  Laterality: N/A;  . TEE WITHOUT CARDIOVERSION N/A 07/23/2016   Procedure: TRANSESOPHAGEAL ECHOCARDIOGRAM (TEE);  Surgeon:  Grace Isaac, MD;  Location: [place];  Service: Open Heart Surgery;  Laterality: N/A;  . TONSILLECTOMY AND ADENOIDECTOMY     also received radiation treatments, as was frequent in children at the time  . WISDOM TOOTH EXTRACTION      Social History   Socioeconomic History  . Marital status: Married    Spouse name: [name]  . Number of children: 3  . Years of education: Not on file  . Highest education level: Not on file  Social Needs  . Financial resource strain: Not on file  . Food insecurity - worry: Not on file  . Food insecurity - inability: Not on file  . Transportation needs - medical: Not on file  . Transportation needs - non-medical: Not on file  Occupational History  . 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 Smoking status: Never Smoker  . Smokeless tobacco: Never Used  Substance and Sexual Activity  . Alcohol use: Yes    Alcohol/week: 1.2 oz    Types: 2 Standard drinks or equivalent per week    Comment: wine - red 2 glasses wine per week  . Drug use: No  . Sexual activity: Yes    Partners: Female  Other Topics Concern  . Not on file  Social History Narrative   Marital status: married x 25 years; second marriage; first marriage x 12 years.      Children: 3 daughters and one step-daughter; 4 grandchildren      Lives: with wife.      Employment: semi-retired; Company secretary; professor; Social worker, Probation officer.      Tobacco:  no      Alcohol: about 2 glasses of wine/week      Exercise: active in the yard.    Family History  Problem Relation Age of Onset  . Heart attack Father   . Heart disease Father 82       AMI as cause of death  . Diabetes Father   . Hyperlipidemia Father   . Parkinson's disease Brother   . Diabetes Brother   . Cancer Brother        melanoma retina  . Heart disease Brother   . Stroke Maternal Grandmother   . Cancer Mother 32       Hodgkins   . Glaucoma Mother   . Colon  cancer Neg Hx   . Colon polyps Neg Hx     ROS:  Back pain but no fevers or chills, productive cough, hemoptysis, dysphasia, odynophagia, melena, hematochezia, dysuria, hematuria, rash, seizure activity, orthopnea, PND, pedal edema, claudication. Remaining systems are negative.  Physical Exam: Well-developed well-nourished in no acute distress.  Skin is warm and dry.  HEENT is normal.  Neck is supple.  Chest is clear to auscultation with normal expansion.  Cardiovascular exam is regular rate and rhythm.  Abdominal exam nontender or distended. No masses palpated. Extremities show no edema. neuro grossly intact  ECG- 10/29/2017-sinus bradycardia  with right bundle branch block. personally reviewed  A/P  1 chest pain-symptoms with both typical and atypical features. Troponins were normal. No recurrences. We discussed options today. We discussed a functional study for risk stratification. However he feels this is not necessary at this point. He will contact us with any further chest pain and we will assess further as needed.  2 coronary artery disease status post coronary artery bypass graft-continue aspirin and statin.  3 thoracic aortic aneurysm-repeat CTA January 2019.  4 hypertension-blood pressure is controlled. Continue present medications.  5 hyperlipidemia-continue statin. Check lipids and liver.  Kirk Ruths, MD

## 2017-11-23 ENCOUNTER — Ambulatory Visit (INDEPENDENT_AMBULATORY_CARE_PROVIDER_SITE_OTHER): Payer: Medicare Other

## 2017-11-23 VITALS — BP 100/70 | HR 66 | Ht 69.0 in | Wt 201.0 lb

## 2017-11-23 DIAGNOSIS — Z0000 Encounter for general adult medical examination without abnormal findings: Secondary | ICD-10-CM | POA: Diagnosis not present

## 2017-11-23 NOTE — Patient Instructions (Addendum)
Lance Stevens , Thank you for taking time to come for your Medicare Wellness Visit. I appreciate your ongoing commitment to your health goals. Please review the following plan we discussed and let me know if I can assist you in the future.   Screening recommendations/referrals: Colonoscopy: up to date, next due 08/26/2025 Recommended yearly ophthalmology/optometry visit for glaucoma screening and checkup Recommended yearly dental visit for hygiene and checkup  Vaccinations: Influenza vaccine: declined today Pneumococcal vaccine: declined today Tdap vaccine: up to date, next due 08/03/2026  Shingles vaccine: Check with your pharmacy about receiving the Shingrix vaccine    Advanced directives: Advance directive discussed with you today. I have provided a copy for you to complete at home and have notarized. Once this is complete please bring a copy in to our office so we can scan it into your chart.  Conditions/risks identified: Try to lose weight and get to around 185 lbs  Next appointment: 11/30/17 @ 9 am with [place] 65 Years and Older, Male Preventive care refers to lifestyle choices and visits with your health care provider that can promote health and wellness. What does preventive care include?  A yearly physical exam. This is also called an annual well check.  Dental exams once or twice a year.  Routine eye exams. Ask your health care provider how often you should have your eyes checked.  Personal lifestyle choices, including:  Daily care of your teeth and gums.  Regular physical activity.  Eating a healthy diet.  Avoiding tobacco and drug use.  Limiting alcohol use.  Practicing safe sex.  Taking low doses of aspirin every day.  Taking vitamin and mineral supplements as recommended by your health care provider. What happens during an annual well check? The services and screenings done by your health care provider during your annual well check  will depend on your age, overall health, lifestyle risk factors, and family history of disease. Counseling  Your health care provider may ask you questions about your:  Alcohol use.  Tobacco use.  Drug use.  Emotional well-being.  Home and relationship well-being.  Sexual activity.  Eating habits.  History of falls.  Memory and ability to understand (cognition).  Work and work Statistician. Screening  You may have the following tests or measurements:  Height, weight, and BMI.  Blood pressure.  Lipid and cholesterol levels. These may be checked every 5 years, or more frequently if you are over 70 years old.  Skin check.  Lung cancer screening. You may have this screening every year starting at age 70 if you have a 30-pack-year history of smoking and currently smoke or have quit within the past 15 years.  Fecal occult blood test (FOBT) of the stool. You may have this test every year starting at age 70.  Flexible sigmoidoscopy or colonoscopy. You may have a sigmoidoscopy every 5 years or a colonoscopy every 10 years starting at age 70.  Prostate cancer screening. Recommendations will vary depending on your family history and other risks.  Hepatitis C blood test.  Hepatitis B blood test.  Sexually transmitted disease (STD) testing.  Diabetes screening. This is done by checking your blood sugar (glucose) after you have not eaten for a while (fasting). You may have this done every 1-3 years.  Abdominal aortic aneurysm (AAA) screening. You may need this if you are a current or former smoker.  Osteoporosis. You may be screened starting at age 70 if you are at high risk. Talk  with your health care provider about your test results, treatment options, and if necessary, the need for more tests. Vaccines  Your health care provider may recommend certain vaccines, such as:  Influenza vaccine. This is recommended every year.  Tetanus, diphtheria, and acellular pertussis  (Tdap, Td) vaccine. You may need a Td booster every 10 years.  Zoster vaccine. You may need this after age 20.  Pneumococcal 13-valent conjugate (PCV13) vaccine. One dose is recommended after age 5.  Pneumococcal polysaccharide (PPSV23) vaccine. One dose is recommended after age 22. Talk to your health care provider about which screenings and vaccines you need and how often you need them. This information is not intended to replace advice given to you by your health care provider. Make sure you discuss any questions you have with your health care provider. Document Released: 12/19/2015 Document Revised: 08/11/2016 Document Reviewed: 09/23/2015 Elsevier Interactive Patient Education  2017 [place] Prevention in the Home Falls can cause injuries. They can happen to people of all ages. There are many things you can do to make your home safe and to help prevent falls. What can I do on the outside of my home?  Regularly fix the edges of walkways and driveways and fix any cracks.  Remove anything that might make you trip as you walk through a door, such as a raised step or threshold.  Trim any bushes or trees on the path to your home.  Use bright outdoor lighting.  Clear any walking paths of anything that might make someone trip, such as rocks or tools.  Regularly check to see if handrails are loose or broken. Make sure that both sides of any steps have handrails.  Any raised decks and porches should have guardrails on the edges.  Have any leaves, snow, or ice cleared regularly.  Use sand or salt on walking paths during winter.  Clean up any spills in your garage right away. This includes oil or grease spills. What can I do in the bathroom?  Use night lights.  Install grab bars by the toilet and in the tub and shower. Do not use towel bars as grab bars.  Use non-skid mats or decals in the tub or shower.  If you need to sit down in the shower, use a plastic, non-slip  stool.  Keep the floor dry. Clean up any water that spills on the floor as soon as it happens.  Remove soap buildup in the tub or shower regularly.  Attach bath mats securely with double-sided non-slip rug tape.  Do not have throw rugs and other things on the floor that can make you trip. What can I do in the bedroom?  Use night lights.  Make sure that you have a light by your bed that is easy to reach.  Do not use any sheets or blankets that are too big for your bed. They should not hang down onto the floor.  Have a firm chair that has side arms. You can use this for support while you get dressed.  Do not have throw rugs and other things on the floor that can make you trip. What can I do in the kitchen?  Clean up any spills right away.  Avoid walking on wet floors.  Keep items that you use a lot in easy-to-reach places.  If you need to reach something above you, use a strong step stool that has a grab bar.  Keep electrical cords out of the way.  Do  not use floor polish or wax that makes floors slippery. If you must use wax, use non-skid floor wax.  Do not have throw rugs and other things on the floor that can make you trip. What can I do with my stairs?  Do not leave any items on the stairs.  Make sure that there are handrails on both sides of the stairs and use them. Fix handrails that are broken or loose. Make sure that handrails are as long as the stairways.  Check any carpeting to make sure that it is firmly attached to the stairs. Fix any carpet that is loose or worn.  Avoid having throw rugs at the top or bottom of the stairs. If you do have throw rugs, attach them to the floor with carpet tape.  Make sure that you have a light switch at the top of the stairs and the bottom of the stairs. If you do not have them, ask someone to add them for you. What else can I do to help prevent falls?  Wear shoes that:  Do not have high heels.  Have rubber bottoms.  Are  comfortable and fit you well.  Are closed at the toe. Do not wear sandals.  If you use a stepladder:  Make sure that it is fully opened. Do not climb a closed stepladder.  Make sure that both sides of the stepladder are locked into place.  Ask someone to hold it for you, if possible.  Clearly mark and make sure that you can see:  Any grab bars or handrails.  First and last steps.  Where the edge of each step is.  Use tools that help you move around (mobility aids) if they are needed. These include:  Canes.  Walkers.  Scooters.  Crutches.  Turn on the lights when you go into a dark area. Replace any light bulbs as soon as they burn out.  Set up your furniture so you have a clear path. Avoid moving your furniture around.  If any of your floors are uneven, fix them.  If there are any pets around you, be aware of where they are.  Review your medicines with your doctor. Some medicines can make you feel dizzy. This can increase your chance of falling. Ask your doctor what other things that you can do to help prevent falls. This information is not intended to replace advice given to you by your health care provider. Make sure you discuss any questions you have with your health care provider. Document Released: 09/18/2009 Document Revised: 04/29/2016 Document Reviewed: 12/27/2014 Elsevier Interactive Patient Education  2017 [employer].

## 2017-11-23 NOTE — Progress Notes (Signed)
Subjective:   Lance Stevens is a 71 y.o. male who presents for an Initial Medicare Annual Wellness Visit.  Review of Systems  N/A Cardiac Risk Factors include: advanced age (>23mn, >>9women);diabetes mellitus;male gender;dyslipidemia;hypertension    Objective:    Today's Vitals   11/23/17 0915  BP: 100/70  Pulse: 66  SpO2: 100%  Weight: 201 lb (91.2 kg)  Height: '5\' 9"'  (1.753 m)   Body mass index is 29.68 kg/m.  Advanced Directives 11/23/2017 10/29/2017 08/03/2016 07/25/2016 07/21/2016 07/17/2016 07/17/2016  Does Patient Have a Medical Advance Directive? No No Yes Yes Yes Yes Yes  Type of Advance Directive - -Public librarianLiving will;Healthcare Power of Attorney Living will;[place]care Power of [place] -  Does patient want to make changes to medical advance directive? - - - - No - Patient declined - -  Copy of [place] Chart? - - - - No - copy requested - -  Would patient like information on creating a medical advance directive? Yes (MAU/Ambulatory/Procedural Areas - Information given) - - - - - -    Current Medications (verified) Outpatient Encounter Medications as of 11/23/2017  Medication Sig  . aspirin EC 325 MG EC tablet Take 1 tablet (325 mg total) by mouth daily.  .Lance Kitchenatorvastatin (LIPITOR) 80 MG tablet Take 1 tablet (80 mg total) by mouth daily at 6 PM.  . blood glucose meter kit and supplies KIT Dispense based on patient and insurance preference. Use up to four times daily as directed. (FOR ICD-9 250.00, 250.01).  . metoprolol succinate (TOPROL-XL) 50 MG 24 hr tablet Take 1 tablet (50 mg total) by mouth daily.   No facility-administered encounter medications on file as of 11/23/2017.     Allergies (verified) Penicillins and Lisinopril   History: Past Medical History:  Diagnosis Date  . Allergy   . Anginal pain ([place]   . Borderline diabetes   . Bronchitis    recurrent  . CAD in  native artery    a. Multivessel CAD by cath 07/2016 - tx with medical therapy with consideration of CABG for refractory angina.  . Essential hypertension   . Fracture, finger    LEFT HAND WITH SWELLING AND BRUISING  . Heart murmur    ? teen  . History of gout   . History of kidney stones   . History of kidney stones   . History of skin cancer    possible - pt not sure of dx  . Hyperlipidemia   . Pneumonia    hx  . Prostate cancer ([facility]    a. s/p radical prostatectomy 04/2016.  .Lance Stevens    recurrent   Past Surgical History:  Procedure Laterality Date  . CARDIAC CATHETERIZATION N/A 07/12/2016   Procedure: Left Heart Cath and Coronary Angiography;  Surgeon: Lance M JMartinique Stevens;  [facility];  Service: Cardiovascular;  Laterality: N/A;  . CARDIAC SURGERY    . CORONARY ARTERY BYPASS GRAFT N/A 07/23/2016   Procedure: CORONARY ARTERY BYPASS GRAFTING (CABG), ON PUMP, TIMES SIX, USING LEFT INTERNAL MAMMARY ARTERY, RIGHT GREATER SAPHENOUS VEIN HARVESTED ENDOSCOPICALLY;  Surgeon: Lance Isaac Stevens;  [facility]  Service: Open Heart Surgery;  Laterality: N/A;  LIMA to LAD, SVG to DIAG, SEQ SVG to INTERMEDIATE and CIRC, SVG to DISTAL RIGHT, SVG to MID POSTERIOR LATERAL.  .Lance KitchenEYE SURGERY     B cataracts removed.  .Lance KitchenEYE  SURGERY     lens implants  . LITHOTRIPSY     89  . LYMPHADENECTOMY Bilateral 04/05/2016   Procedure: LYMPHADENECTOMY, BILATERAL PELVIC LYMPHADENECTOMY;  Surgeon: Lance Stevens;  Location: [facility];  Service: Urology;  Laterality: Bilateral;  . PROSTATE BIOPSY    . ROBOT ASSISTED LAPAROSCOPIC RADICAL PROSTATECTOMY N/A 04/05/2016   Procedure: XI ROBOTIC ASSISTED LAPAROSCOPIC RADICAL PROSTATECTOMY LEVEL 2;  Surgeon: Lance Stevens;  Location: [facility];  Service: Urology;  Laterality: N/A;  . TEE WITHOUT CARDIOVERSION N/A 07/23/2016   Procedure: TRANSESOPHAGEAL ECHOCARDIOGRAM (TEE);  Surgeon: Lance Stevens;  Location: [place];  Service: Open Heart  Surgery;  Laterality: N/A;  . TONSILLECTOMY AND ADENOIDECTOMY     also received radiation treatments, as was frequent in children at the time  . WISDOM TOOTH EXTRACTION     Family History  Problem Relation Age of Onset  . Heart attack Father   . Heart disease Father 41       AMI as cause of death  . Diabetes Father   . Hyperlipidemia Father   . Parkinson's disease Brother   . Diabetes Brother   . Cancer Brother        melanoma retina  . Heart disease Brother   . Stroke Maternal Grandmother   . Cancer Mother 30       Hodgkins   . Glaucoma Mother   . Colon cancer Neg Hx   . Colon polyps Neg Hx    Social History   Socioeconomic History  . Marital status: Married    Spouse name: Lance Stevens  . Number of children: 3  . Years of education: None  . Highest education level: Professional school degree (e.g., Stevens, DDS, DVM, JD)  Social Needs  . Financial resource strain: Not hard at all  . Food insecurity - worry: Never true  . Food insecurity - inability: Never true  . Transportation needs - medical: No  . Transportation needs - non-medical: No  Occupational History  . 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 Smoking status: Never Smoker  . Smokeless tobacco: Never Used  Substance and Sexual Activity  . Alcohol use: Yes    Alcohol/week: 1.2 oz    Types: 2 Standard drinks or equivalent per week    Comment: wine - red 2 glasses wine per week  . Drug use: No  . Sexual activity: Yes    Partners: Female  Other Topics Concern  . None  Social History Narrative   Marital status: married x 25 years; second marriage; first marriage x 12 years.      Children: 3 daughters and one step-daughter; 4 grandchildren      Lives: with wife.      Employment: semi-retired; Company secretary; professor; Social worker, Probation officer.      Tobacco:  no      Alcohol: about 2 glasses of wine/week      Exercise: active in the yard.   Tobacco  Counseling Counseling given: Not Answered   Clinical Intake:  Pre-visit preparation completed: Yes  Pain : No/denies pain     Nutritional Status: BMI 25 -29 Overweight Nutritional Risks: None Diabetes: Yes CBG done?: No Did pt. Stevens in CBG monitor from home?: No  How often do you need to have someone help you when you read instructions, pamphlets, or other written materials from your doctor or pharmacy?: 1 - Never What is the last grade level you completed  in school?: Phd  Interpreter Needed?: No  Information entered by :: Andrez Grime, LPN  Activities of Daily Living In your present state of health, do you have any difficulty performing the following activities: 11/23/2017  Hearing? Y  Comment Patient has some slight hearing loss  Vision? Y  Comment Patient has some astigmatism in his right eye  Difficulty concentrating or making decisions? Y  Comment Patient has some slight issues with remembering things  Walking or climbing stairs? N  Dressing or bathing? N  Doing errands, shopping? N  Preparing Food and eating ? N  Using the Toilet? N  In the past six months, have you accidently leaked urine? Y  Comment Patient wears pads sometimes  Do you have problems with loss of bowel control? N  Managing your Medications? N  Managing your Finances? N  Housekeeping or managing your Housekeeping? N  Some recent data might be hidden     Immunizations and Health Maintenance Immunization History  Administered Date(s) Administered  . Influenza,inj,Quad PF,6+ Mos 12/09/2015, 09/10/2016  . Pneumococcal Conjugate-13 05/07/2015   Health Maintenance Due  Topic Date Due  . OPHTHALMOLOGY EXAM  01/14/1956  . FOOT EXAM  08/03/2017    Patient Care Team: Harrison Mons, PA-C as PCP - General (Family Medicine) Lance Stevens as Consulting Physician (Urology) Stanford Breed Denice Bors, Stevens as Consulting Physician (Cardiology)  Indicate any recent Medical Services you may have  received from other than [facility] providers in the past year (date may be approximate).    Assessment:   This is a routine wellness examination for [place].  Hearing/Vision screen Hearing Screening Comments: Patient has had a hearing exam in the past and some slight hearing loss was noted.  Vision Screening Comments: Patient sees Dr. Prudencio Burly for his routine eye exam every year.  Dietary issues and exercise activities discussed: Current Exercise Habits: Structured exercise class, Type of exercise: walking, Time (Minutes): 60, Frequency (Times/Week): 7, Weekly Exercise (Minutes/Week): 420, Intensity: Moderate, Exercise limited by: None identified  Goals    . Weight (lb) < 185 lb (83.9 kg)     Patient would like to try to lose weight and get to around 185 lbs      Depression Screen PHQ 2/9 Scores 11/23/2017 11/01/2016 10/18/2016 09/27/2016  PHQ - 2 Score 0 0 0 0  PHQ- 9 Score - - - -    Fall Risk Fall Risk  11/23/2017 10/14/2016 09/27/2016 09/10/2016 08/03/2016  Falls in the past year? No Yes Yes No Yes  Number falls in past yr: - 1 1 - 1  Injury with Fall? - Yes Yes - Yes  Comment - - - - left hand   Risk for fall due to : - - - - Other (Comment)  Risk for fall due to: Comment - - - - tripped over object    Is the patient's home free of loose throw rugs in walkways, pet beds, electrical cords, etc?   yes      Grab bars in the bathroom? yes      Handrails on the stairs?   yes      Adequate lighting?   yes  Timed Get Up and Go performed: yes, passed, completed in 30 seconds  Cognitive Function:     6CIT Screen 11/23/2017  What Year? 0 points  What month? 0 points  What time? 0 points  Count back from 20 0 points  Months in reverse 2 points  Repeat phrase 4 points  Total Score  6    Screening Tests Health Maintenance  Topic Date Due  . OPHTHALMOLOGY EXAM  01/14/1956  . FOOT EXAM  08/03/2017  . INFLUENZA VACCINE  11/30/2017 (Originally 07/06/2017)  . HEMOGLOBIN A1C  11/30/2017  (Originally 03/11/2017)  . URINE MICROALBUMIN  11/30/2017 (Originally 09/10/2017)  . PNA vac Low Risk Adult (2 of 2 - PPSV23) 11/30/2017 (Originally 05/06/2016)  . COLONOSCOPY  08/26/2025  . TETANUS/TDAP  08/03/2026  . Hepatitis C Screening  Completed    Qualifies for Shingles Vaccine? Patient declined  Cancer Screenings: Lung: Low Dose CT Chest recommended if Age 40-80 years, 30 pack-year currently smoking OR have quit w/in 15years. Patient does not qualify. Colorectal: up to date, completed 08/27/2015  Additional Screenings:  Hepatitis B/HIV/Syphillis: not indicated Hepatitis C Screening: completed 12/09/2015      Plan:   I have personally reviewed and noted the following in the patient's chart:   . Medical and social history . Use of alcohol, tobacco or illicit drugs  . Current medications and supplements . Functional ability and status . Nutritional status . Physical activity . Advanced directives . List of other physicians . Hospitalizations, surgeries, and ER visits in previous 12 months . Vitals . Screenings to include cognitive, depression, and falls . Referrals and appointments  In addition, I have reviewed and discussed with patient certain preventive protocols, quality metrics, and best practice recommendations. A written personalized care plan for preventive services as well as general preventive health recommendations were provided to patient.    Patient declined flu, pneumonia and shingles vaccine at this time. He has a lot of driving to do today and he wants to wait and have these done. Patient wants to wait and have his blood work completed at his office visit with his PCP next week.   1. Encounter for Medicare annual wellness exam    Andrez Grime, LPN   01/05/4387

## 2017-11-24 ENCOUNTER — Encounter: Payer: Self-pay | Admitting: *Deleted

## 2017-11-30 ENCOUNTER — Ambulatory Visit (INDEPENDENT_AMBULATORY_CARE_PROVIDER_SITE_OTHER): Payer: Medicare Other | Admitting: Physician Assistant

## 2017-11-30 ENCOUNTER — Other Ambulatory Visit: Payer: Self-pay

## 2017-11-30 ENCOUNTER — Encounter: Payer: Self-pay | Admitting: Physician Assistant

## 2017-11-30 VITALS — BP 118/70 | HR 65 | Temp 97.4°F | Resp 17 | Ht 69.0 in | Wt 196.4 lb

## 2017-11-30 DIAGNOSIS — Z0000 Encounter for general adult medical examination without abnormal findings: Secondary | ICD-10-CM | POA: Diagnosis not present

## 2017-11-30 DIAGNOSIS — N182 Chronic kidney disease, stage 2 (mild): Secondary | ICD-10-CM

## 2017-11-30 DIAGNOSIS — E781 Pure hyperglyceridemia: Secondary | ICD-10-CM

## 2017-11-30 DIAGNOSIS — E119 Type 2 diabetes mellitus without complications: Secondary | ICD-10-CM | POA: Diagnosis not present

## 2017-11-30 DIAGNOSIS — Z23 Encounter for immunization: Secondary | ICD-10-CM

## 2017-11-30 DIAGNOSIS — I10 Essential (primary) hypertension: Secondary | ICD-10-CM | POA: Diagnosis not present

## 2017-11-30 MED ORDER — ATORVASTATIN CALCIUM 80 MG PO TABS: 80.0000 mg | ORAL_TABLET | Freq: Every day | ORAL | 3 refills | Status: AC

## 2017-11-30 MED ORDER — METOPROLOL SUCCINATE ER 50 MG PO TB24: 50.0000 mg | ORAL_TABLET | Freq: Every day | ORAL | 3 refills | Status: AC

## 2017-11-30 NOTE — Assessment & Plan Note (Signed)
Well controlled 

## 2017-11-30 NOTE — Assessment & Plan Note (Signed)
No longer on fenofibrate since CABG. Continues to tolerate statin. Await lab results. Goal LDL <70.

## 2017-11-30 NOTE — Patient Instructions (Addendum)
IF you received an x-ray today, you will receive an invoice from [facility]. Please contact [facility] at [phone] with questions or concerns regarding your invoice.   IF you received labwork today, you will receive an invoice from [facility]. Please contact [facility] at [phone] with questions or concerns regarding your invoice.   Our billing staff will not be able to assist you with questions regarding bills from these companies.  You will be contacted with the lab results as soon as they are available. The fastest way to get your results is to activate your My Chart account. Instructions are located on the last page of this paperwork. If you have not heard from [place] regarding the results in 2 weeks, please contact this office.     Please get the [facility] vaccine (to prevent shingles) from your pharmacy. There has been a back order, so they may put your name on the waiting list for this 2-dose series.     Preventive Care 70 Years and Older, Male Preventive care refers to lifestyle choices and visits with your health care provider that can promote health and wellness. What does preventive care include?  A yearly physical exam. This is also called an annual well check.  Dental exams once or twice a year.  Routine eye exams. Ask your health care provider how often you should have your eyes checked.  Personal lifestyle choices, including: ? Daily care of your teeth and gums. ? Regular physical activity. ? Eating a healthy diet. ? Avoiding tobacco and drug use. ? Limiting alcohol use. ? Practicing safe sex. ? Taking low doses of aspirin every day. ? Taking vitamin and mineral supplements as recommended by your health care provider. What happens during an annual well check? The services and screenings done by your health care provider during your annual well check will depend on your age, overall health, lifestyle risk factors, and family history of  disease. Counseling Your health care provider may ask you questions about your:  Alcohol use.  Tobacco use.  Drug use.  Emotional well-being.  Home and relationship well-being.  Sexual activity.  Eating habits.  History of falls.  Memory and ability to understand (cognition).  Work and work Statistician.  Screening You may have the following tests or measurements:  Height, weight, and BMI.  Blood pressure.  Lipid and cholesterol levels. These may be checked every 5 years, or more frequently if you are over 19 years old.  Skin check.  Lung cancer screening. You may have this screening every year starting at age 25 if you have a 30-pack-year history of smoking and currently smoke or have quit within the past 15 years.  Fecal occult blood test (FOBT) of the stool. You may have this test every year starting at age 10.  Flexible sigmoidoscopy or colonoscopy. You may have a sigmoidoscopy every 5 years or a colonoscopy every 10 years starting at age 51.  Prostate cancer screening. Recommendations will vary depending on your family history and other risks.  Hepatitis C blood test.  Hepatitis B blood test.  Sexually transmitted disease (STD) testing.  Diabetes screening. This is done by checking your blood sugar (glucose) after you have not eaten for a while (fasting). You may have this done every 1-3 years.  Abdominal aortic aneurysm (AAA) screening. You may need this if you are a current or former smoker.  Osteoporosis. You may be screened starting at age 69 if you are at high risk.  Talk  with your health care provider about your test results, treatment options, and if necessary, the need for more tests. Vaccines Your health care provider may recommend certain vaccines, such as:  Influenza vaccine. This is recommended every year.  Tetanus, diphtheria, and acellular pertussis (Tdap, Td) vaccine. You may need a Td booster every 10 years.  Varicella vaccine. You  may need this if you have not been vaccinated.  Zoster vaccine. You may need this after age 48.  Measles, mumps, and rubella (MMR) vaccine. You may need at least one dose of MMR if you were born in 1957 or later. You may also need a second dose.  Pneumococcal 13-valent conjugate (PCV13) vaccine. One dose is recommended after age 74.  Pneumococcal polysaccharide (PPSV23) vaccine. One dose is recommended after age 56.  Meningococcal vaccine. You may need this if you have certain conditions.  Hepatitis A vaccine. You may need this if you have certain conditions or if you travel or work in places where you may be exposed to hepatitis A.  Hepatitis B vaccine. You may need this if you have certain conditions or if you travel or work in places where you may be exposed to hepatitis B.  Haemophilus influenzae type b (Hib) vaccine. You may need this if you have certain risk factors.  Talk to your health care provider about which screenings and vaccines you need and how often you need them. This information is not intended to replace advice given to you by your health care provider. Make sure you discuss any questions you have with your health care provider. Document Released: 12/19/2015 Document Revised: 08/11/2016 Document Reviewed: 09/23/2015 Elsevier Interactive Patient Education  [employer].

## 2017-11-30 NOTE — Assessment & Plan Note (Signed)
Await labs. Adjust regimen as indicated by results. No longer taking metformin. Managing through lifestyle changes.

## 2017-11-30 NOTE — Progress Notes (Signed)
Patient ID: Lance Stevens, male    DOB: 01-Jun-1946, 71 y.o.   MRN: [id number]  PCP: Harrison Mons, PA-C  Chief Complaint  Patient presents with  . Annual Exam   Patient Care Team: Harrison Mons, PA-C as PCP - General (Family Medicine) Raynelle Bring, MD as Consulting Physician (Urology) Stanford Breed Denice Bors, MD as Consulting Physician (Cardiology)  Subjective:   Presents for Annual Wellness Visit.  Generally feels pretty good.  To see urology this week to follow PSA. Cardiology ordered update CT scan to evaluate dilated ascending aorta (4 cm 12/2016). Walks 60 minutes/day. Home BP remains controlled <130. Needs a new lancet device for home glucose checks. Before his broke, his glucose was always <130. Cutting back on his sugar. No longer taking metformin.  Colorectal Cancer Screening: colonoscopy 08/2015 Prostate Cancer Screening: history of prostate cancer, managed by urology. PSA has been undetectable. Bone Density Testing: lower risk. Not performed. HIV Screening: no longer a candidate, very low risk STI Screening: very low risk Seasonal Influenza Vaccination: today Td/Tdap Vaccination: 08/03/2016 Pneumococcal Vaccination: Prevnar-13 05/2015. Due for Pneumovax-23. Zoster Vaccination: recommended, not yet received. Frequency of Dental evaluation: went several years without, but has recently resumed care and had several procedures done. Frequency of Eye evaluation: scheduled for next month with Dr. Prudencio Burly at [facility], annually.     Patient Active Problem List   Diagnosis Date Noted  . Atrial fibrillation with rapid ventricular response ([place]) 07/26/2016  . S/P CABG (coronary artery bypass graft) 07/23/2016  . Essential hypertension 07/17/2016  . CKD (chronic kidney disease), stage II 07/12/2016  . CAD (coronary artery disease) 07/12/2016  . Prostate cancer ([place]) 04/05/2016  . 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 Hypertriglyceridemia 12/11/2015  . Diabetes mellitus type 2 in nonobese ([place]) 05/27/2015  . Need for shingles vaccine 05/27/2015  . Family history of Parkinson's disease 05/27/2015  . History of nephrolithiasis 05/27/2015    Past Medical History:  Diagnosis Date  . Allergy   . Anginal pain ([place])   . Borderline diabetes   . Bronchitis    recurrent  . CAD in native artery    a. Multivessel CAD by cath 07/2016 - tx with medical therapy with consideration of CABG for refractory angina.  . Essential hypertension   . Fracture, finger    LEFT HAND WITH SWELLING AND BRUISING  . Heart murmur    ? teen  . History of gout   . History of kidney stones   . History of kidney stones   . History of skin cancer    possible - pt not sure of dx  . Hyperlipidemia   . Pneumonia    hx  . Prostate cancer [facility])    a. s/p radical prostatectomy 04/2016.  Marland Kitchen Sinus infection    recurrent     Prior to Admission medications   Medication Sig Start Date End Date Taking? Authorizing Provider  aspirin EC 325 MG EC tablet Take 1 tablet (325 mg total) by mouth daily. 07/28/16  Yes Lars Pinks M, PA-C  atorvastatin (LIPITOR) 80 MG tablet Take 1 tablet (80 mg total) by mouth daily at 6 PM. 10/25/16  Yes Hermes Wafer, PA-C  blood glucose meter kit and supplies KIT Dispense based on patient and insurance preference. Use up to four times daily as directed. (FOR ICD-9 250.00, 250.01). 08/06/16  Yes Tacy Dura,  Donielle M, PA-C  metoprolol succinate (TOPROL-XL) 50 MG 24 hr tablet Take 1 tablet (50 mg total) by mouth daily. 10/20/16  Yes Harrison Mons, PA-C    Allergies  Allergen Reactions  . Penicillins Swelling and Other (See Comments)    As a child. Has patient had a PCN reaction causing immediate rash, facial/tongue/throat swelling, SOB or lightheadedness with hypotension: Yes Has patient had a PCN reaction causing severe rash involving mucus  membranes or skin necrosis: No Has patient had a PCN reaction that required hospitalization: Was already in hospital when reaction happened Has patient had a PCN reaction occurring within the last 10 years: No  If all of the above answers are "NO", then may proceed with Cephalosporin use.   Marland Kitchen Lisinopril Other (See Comments)    Sexual side effects    Past Surgical History:  Procedure Laterality Date  . CARDIAC CATHETERIZATION N/A 07/12/2016   Procedure: Left Heart Cath and Coronary Angiography;  Surgeon: Peter M Martinique, MD;  Location: [place] [facility];  Service: Cardiovascular;  Laterality: N/A;  . CARDIAC SURGERY    . CORONARY ARTERY BYPASS GRAFT N/A 07/23/2016   Procedure: CORONARY ARTERY BYPASS GRAFTING (CABG), ON PUMP, TIMES SIX, USING LEFT INTERNAL MAMMARY ARTERY, RIGHT GREATER SAPHENOUS VEIN HARVESTED ENDOSCOPICALLY;  Surgeon: Grace Isaac, MD;  Location: [place];  Service: Open Heart Surgery;  Laterality: N/A;  LIMA to LAD, SVG to DIAG, SEQ SVG to INTERMEDIATE and CIRC, SVG to DISTAL RIGHT, SVG to MID POSTERIOR LATERAL.  Marland Kitchen EYE SURGERY     B cataracts removed.  Marland Kitchen EYE SURGERY     lens implants  . LITHOTRIPSY     89  . LYMPHADENECTOMY Bilateral 04/05/2016   Procedure: LYMPHADENECTOMY, BILATERAL PELVIC LYMPHADENECTOMY;  Surgeon: Raynelle Bring, MD;  Location: [facility];  Service: Urology;  Laterality: Bilateral;  . PROSTATE BIOPSY    . ROBOT ASSISTED LAPAROSCOPIC RADICAL PROSTATECTOMY N/A 04/05/2016   Procedure: XI ROBOTIC ASSISTED LAPAROSCOPIC RADICAL PROSTATECTOMY LEVEL 2;  Surgeon: Raynelle Bring, MD;  Location: [facility];  Service: Urology;  Laterality: N/A;  . TEE WITHOUT CARDIOVERSION N/A 07/23/2016   Procedure: TRANSESOPHAGEAL ECHOCARDIOGRAM (TEE);  Surgeon: Grace Isaac, MD;  Location: [place];  Service: Open Heart Surgery;  Laterality: N/A;  . TONSILLECTOMY AND ADENOIDECTOMY     also received radiation treatments, as was frequent in children at the time  . WISDOM TOOTH EXTRACTION        Family History  Problem Relation Age of Onset  . Heart attack Father   . Heart disease Father 79       AMI as cause of death  . Diabetes Father   . Hyperlipidemia Father   . Parkinson's disease Brother   . Diabetes Brother   . Cancer Brother        melanoma retina  . Heart disease Brother   . Stroke Maternal Grandmother   . Cancer Mother 78       Hodgkins   . Glaucoma Mother   . Colon cancer Neg Hx   . Colon polyps Neg Hx     Social History   Socioeconomic History  . Marital status: Married    Spouse name: [name]  . Number of children: 3  . Years of education: None  . Highest education level: Professional school degree (e.g., MD, DDS, DVM, JD)  Social Needs  . Financial resource strain: Not hard at all  . Food insecurity - worry: Never true  . Food insecurity -  inability: Never true  . Transportation needs - medical: No  . Transportation needs - non-medical: No  Occupational History  . 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 Smoking status: Never Smoker  . Smokeless tobacco: Never Used  Substance and Sexual Activity  . Alcohol use: Yes    Alcohol/week: 1.2 oz    Types: 2 Standard drinks or equivalent per week    Comment: wine - red 2 glasses wine per week  . Drug use: No  . Sexual activity: Yes    Partners: Female  Other Topics Concern  . None  Social History Narrative   Marital status: married x 25 years; second marriage; first marriage x 12 years.      Children: 3 daughters and one step-daughter; 4 grandchildren      Lives: with wife.      Employment: semi-retired; Company secretary; professor; Social worker, Probation officer.      Tobacco:  no      Alcohol: about 2 glasses of wine/week      Exercise: active in the yard.       Review of Systems  Constitutional: Negative.   HENT: Negative.   Eyes: Negative.   Respiratory: Negative.   Cardiovascular: Negative.   Gastrointestinal: Negative.    Endocrine: Negative.   Genitourinary: Negative.   Musculoskeletal: Positive for neck stiffness. Negative for arthralgias, back pain, gait problem, joint swelling, myalgias and neck pain.  Skin: Negative.   Allergic/Immunologic: Positive for environmental allergies. Negative for food allergies and immunocompromised state.  Neurological: Negative.   Hematological: Negative.   Psychiatric/Behavioral: Negative.         Objective:  Physical Exam  Constitutional: He is oriented to person, place, and time. He appears well-developed and well-nourished. He is active and cooperative. No distress.  BP 118/70 (BP Location: Right Arm, Patient Position: Sitting, Cuff Size: Normal)   Pulse 65   Temp (!) 97.4 F (36.3 C) (Oral)   Resp 17   Ht _0  (1.753 m)   Wt 196 lb 6.4 oz (89.1 kg)   SpO2 98%   BMI 29.00 kg/m   HENT:  Head: Normocephalic and atraumatic.  Right Ear: Hearing normal.  Left Ear: Hearing normal.  Eyes: Conjunctivae are normal. No scleral icterus.  Neck: Normal range of motion. Neck supple. No thyromegaly present.  Cardiovascular: Normal rate, regular rhythm and normal heart sounds.  Pulses:      Radial pulses are 2+ on the right side, and 2+ on the left side.  Pulmonary/Chest: Effort normal and breath sounds normal.  Lymphadenopathy:       Head (right side): No tonsillar, no preauricular, no posterior auricular and no occipital adenopathy present.       Head (left side): No tonsillar, no preauricular, no posterior auricular and no occipital adenopathy present.    He has no cervical adenopathy.       Right: No supraclavicular adenopathy present.       Left: No supraclavicular adenopathy present.  Neurological: He is alert and oriented to person, place, and time. No sensory deficit.  Skin: Skin is warm, dry and intact. No rash noted. No cyanosis or erythema. Nails show no clubbing.  Psychiatric: He has a normal mood and affect. His speech is normal and behavior is normal.      Wt Readings from Last 3 Encounters:  11/30/17 196 lb 6.4 oz (89.1 kg)  11/23/17 201 lb (91.2 kg)  11/01/17 202 lb (91.6 kg)  Diabetic Foot Exam - Simple   Simple Foot Form Visual Inspection No deformities, no ulcerations, no other skin breakdown bilaterally:  Yes Sensation Testing Intact to touch and monofilament testing bilaterally:  Yes Pulse Check Posterior Tibialis and Dorsalis pulse intact bilaterally:  Yes Comments        Assessment & Plan:   Problem List Items Addressed This Visit    Diabetes mellitus type 2 in nonobese [facility])    Await labs. Adjust regimen as indicated by results. No longer taking metformin. Managing through lifestyle changes.      Relevant Medications   atorvastatin (LIPITOR) 80 MG tablet   Other Relevant Orders   Hemoglobin A1c   Microalbumin / creatinine urine ratio   Comprehensive metabolic panel   HM DIABETES EYE EXAM (Completed)   HM DIABETES FOOT EXAM (Completed)   Hypertriglyceridemia    No longer on fenofibrate since CABG. Continues to tolerate statin. Await lab results. Goal LDL <70.      Relevant Medications   metoprolol succinate (TOPROL-XL) 50 MG 24 hr tablet   atorvastatin (LIPITOR) 80 MG tablet   Other Relevant Orders   Lipid panel   CKD (chronic kidney disease), stage II    Continue to manage blood pressure and glucose.      Relevant Orders   CBC with Differential/Platelet   Essential hypertension    Well controlled.      Relevant Medications   metoprolol succinate (TOPROL-XL) 50 MG 24 hr tablet   atorvastatin (LIPITOR) 80 MG tablet   Other Relevant Orders   CBC with Differential/Platelet    Other Visit Diagnoses    Annual physical exam    -  Primary   Age appropriate health guidance provided   Need for influenza vaccination       Relevant Orders   Flu Vaccine QUAD 36+ mos IM (Completed)   Need for pneumococcal vaccination       Relevant Orders   Pneumococcal polysaccharide vaccine 23-valent greater  than or equal to 2yo subcutaneous/IM (Completed)       Return in about 6 months (around 05/31/2018) for glucose.   Fara Chute, PA-C [facility] [place]

## 2017-11-30 NOTE — Assessment & Plan Note (Signed)
Continue to manage blood pressure and glucose.

## 2017-12-01 LAB — MICROALBUMIN / CREATININE URINE RATIO
CREATININE, UR: 200.3 mg/dL
Microalb/Creat Ratio: 6.6 mg/g creat (ref 0.0–30.0)
Microalbumin, Urine: 13.2 ug/mL

## 2017-12-01 LAB — COMPREHENSIVE METABOLIC PANEL
ALK PHOS: 149 IU/L — AB (ref 39–117)
ALT: 143 IU/L — ABNORMAL HIGH (ref 0–44)
AST: 21 IU/L (ref 0–40)
Albumin/Globulin Ratio: 2 (ref 1.2–2.2)
Albumin: 4.7 g/dL (ref 3.5–4.8)
BUN/Creatinine Ratio: 14 (ref 10–24)
BUN: 17 mg/dL (ref 8–27)
Bilirubin Total: 1.9 mg/dL — ABNORMAL HIGH (ref 0.0–1.2)
CALCIUM: 9.7 mg/dL (ref 8.6–10.2)
CO2: 24 mmol/L (ref 20–29)
CREATININE: 1.2 mg/dL (ref 0.76–1.27)
Chloride: 105 mmol/L (ref 96–106)
GFR calc Af Amer: 70 mL/min/{1.73_m2} (ref >59)
GFR, EST NON AFRICAN AMERICAN: 61 mL/min/{1.73_m2} (ref >59)
GLUCOSE: 156 mg/dL — AB (ref 65–99)
Globulin, Total: 2.3 g/dL (ref 1.5–4.5)
Potassium: 5.1 mmol/L (ref 3.5–5.2)
Sodium: 144 mmol/L (ref 134–144)
TOTAL PROTEIN: 7 g/dL (ref 6.0–8.5)

## 2017-12-01 LAB — CBC WITH DIFFERENTIAL/PLATELET
Basophils Absolute: 0 10*3/uL (ref 0.0–0.2)
Basos: 1 %
EOS (ABSOLUTE): 0.3 10*3/uL (ref 0.0–0.4)
EOS: 4 %
HEMATOCRIT: 46.3 % (ref 37.5–51.0)
HEMOGLOBIN: 15.4 g/dL (ref 13.0–17.7)
IMMATURE GRANS (ABS): 0 10*3/uL (ref 0.0–0.1)
Immature Granulocytes: 0 %
LYMPHS: 31 %
Lymphocytes Absolute: 2 10*3/uL (ref 0.7–3.1)
MCH: 31.1 pg (ref 26.6–33.0)
MCHC: 33.3 g/dL (ref 31.5–35.7)
MCV: 94 fL (ref 79–97)
MONOCYTES: 8 %
Monocytes Absolute: 0.5 10*3/uL (ref 0.1–0.9)
Neutrophils Absolute: 3.6 10*3/uL (ref 1.4–7.0)
Neutrophils: 56 %
Platelets: 178 10*3/uL (ref 150–379)
RBC: 4.95 x10E6/uL (ref 4.14–5.80)
RDW: 13.1 % (ref 12.3–15.4)
WBC: 6.3 10*3/uL (ref 3.4–10.8)

## 2017-12-01 LAB — LIPID PANEL
CHOLESTEROL TOTAL: 126 mg/dL (ref 100–199)
Chol/HDL Ratio: 3.9 ratio (ref 0.0–5.0)
HDL: 32 mg/dL — AB (ref >39)
LDL Calculated: 70 mg/dL (ref 0–99)
TRIGLYCERIDES: 118 mg/dL (ref 0–149)
VLDL Cholesterol Cal: 24 mg/dL (ref 5–40)

## 2017-12-01 LAB — HEMOGLOBIN A1C
ESTIMATED AVERAGE GLUCOSE: 163 mg/dL
HEMOGLOBIN A1C: 7.3 % — AB (ref 4.8–5.6)

## 2017-12-13 ENCOUNTER — Other Ambulatory Visit: Payer: Self-pay | Admitting: Physician Assistant

## 2017-12-14 ENCOUNTER — Telehealth: Payer: Self-pay | Admitting: Physician Assistant

## 2017-12-14 ENCOUNTER — Ambulatory Visit (INDEPENDENT_AMBULATORY_CARE_PROVIDER_SITE_OTHER)
Admission: RE | Admit: 2017-12-14 | Discharge: 2017-12-14 | Disposition: A | Discharge status: Home / Self Care | Payer: Medicare Other | Source: Ambulatory Visit | Attending: Cardiology | Admitting: Cardiology

## 2017-12-14 DIAGNOSIS — I712 Thoracic aortic aneurysm, without rupture, unspecified: Secondary | ICD-10-CM

## 2017-12-14 MED ORDER — IOPAMIDOL (ISOVUE-370) INJECTION 76%
100.0000 mL | Freq: Once | INTRAVENOUS | Status: AC | PRN
  Administered 2017-12-14: 100 mL via INTRAVENOUS

## 2017-12-14 NOTE — Telephone Encounter (Signed)
Copied from [place] [id number]. Topic: Quick Communication - See Telephone Encounter >> Dec 14, 2017 10:43 AM Percell Belt A wrote: CRM for notification. See Telephone encounter for: pt called in and said that him and Provider ( dr Nyoka Cowden) decided for him to go back on the metformin.  Pharmacy sent it over yesterday.  He is request for refill to be sent to pharmacy    Also wanted to send a fyi that he had his CT this morning   12/14/17.

## 2017-12-15 ENCOUNTER — Other Ambulatory Visit: Payer: Self-pay | Admitting: Physician Assistant

## 2017-12-16 MED ORDER — METFORMIN HCL ER 500 MG PO TB24: 500.0000 mg | ORAL_TABLET | Freq: Every day | ORAL | 3 refills | Status: AC

## 2017-12-16 NOTE — Telephone Encounter (Signed)
Recommendation to resume metformin is from me, in the result note notifying him of the labs drawn at his last visit with me.  Merri Ray, MD and Harrison Mons, PA-C are often confused by folks that don't know [place] well.  Meds ordered this encounter  Medications  . metFORMIN (GLUCOPHAGE-XR) 500 MG 24 hr tablet    Sig: Take 1 tablet (500 mg total) by mouth daily with breakfast.    Dispense:  90 tablet    Refill:  3    Order Specific Question:   Supervising Provider    Answer:   Brigitte Pulse, EVA N [[id number]]

## 2017-12-16 NOTE — Telephone Encounter (Signed)
Message sent to [facility]. Pt has not seen Dr. Carlota Raspberry here, ? Another Dr. Nyoka Cowden

## 2017-12-26 LAB — HM DIABETES EYE EXAM

## 2017-12-27 ENCOUNTER — Telehealth: Payer: Self-pay | Admitting: Physician Assistant

## 2017-12-27 NOTE — Telephone Encounter (Unsigned)
Copied from [place] [id number]. Topic: Quick Communication - Rx Refill/Question >> Dec 27, 2017 10:35 AM Percell Belt A wrote: Medication: metoprolol succinate (TOPROL-XL) 50 MG 24 hr tablet [[id number]]   Has the patient contacted their pharmacy? No    (Agent: If no, request that the patient contact the pharmacy for the refill.)   Preferred Pharmacy (with phone number or street name): [facility] across [address], pt is completely out    Agent: Please be advised that RX refills may take up to 3 business days. We ask that you follow-up with your pharmacy.

## 2017-12-27 NOTE — Telephone Encounter (Signed)
Call to the pharmacy- they had Rx and will go ahead and fill it for patient to pick up.

## 2017-12-28 ENCOUNTER — Encounter: Payer: Self-pay | Admitting: Physician Assistant

## 2018-01-06 ENCOUNTER — Encounter: Payer: Self-pay | Admitting: Emergency Medicine

## 2018-01-06 ENCOUNTER — Other Ambulatory Visit: Payer: Self-pay

## 2018-01-06 ENCOUNTER — Ambulatory Visit: Payer: Medicare Other | Admitting: Emergency Medicine

## 2018-01-06 VITALS — BP 126/66 | HR 61 | Temp 97.6°F | Resp 16 | Ht 68.75 in | Wt 200.0 lb

## 2018-01-06 DIAGNOSIS — J0100 Acute maxillary sinusitis, unspecified: Secondary | ICD-10-CM

## 2018-01-06 HISTORY — DX: Acute maxillary sinusitis, unspecified: J01.00

## 2018-01-06 MED ORDER — AZITHROMYCIN 250 MG PO TABS: ORAL_TABLET | ORAL | 0 refills | Status: DC

## 2018-01-06 NOTE — Patient Instructions (Addendum)
     IF you received an x-ray today, you will receive an invoice from [facility]. Please contact [facility] at [phone] with questions or concerns regarding your invoice.   IF you received labwork today, you will receive an invoice from [facility]. Please contact [facility] at [phone] with questions or concerns regarding your invoice.   Our billing staff will not be able to assist you with questions regarding bills from these companies.  You will be contacted with the lab results as soon as they are available. The fastest way to get your results is to activate your My Chart account. Instructions are located on the last page of this paperwork. If you have not heard from us regarding the results in 2 weeks, please contact this office.     Sinusitis, Adult Sinusitis is soreness and inflammation of your sinuses. Sinuses are hollow spaces in the bones around your face. They are located:  Around your eyes.  In the middle of your forehead.  Behind your nose.  In your cheekbones.  Your sinuses and nasal passages are lined with a stringy fluid (mucus). Mucus normally drains out of your sinuses. When your nasal tissues get inflamed or swollen, the mucus can get trapped or blocked so air cannot flow through your sinuses. This lets bacteria, viruses, and funguses grow, and that leads to infection. Follow these instructions at home: Medicines  Take, use, or apply over-the-counter and prescription medicines only as told by your doctor. These may include nasal sprays.  If you were prescribed an antibiotic medicine, take it as told by your doctor. Do not stop taking the antibiotic even if you start to feel better. Hydrate and Humidify  Drink enough water to keep your pee (urine) clear or pale yellow.  Use a cool mist humidifier to keep the humidity level in your home above 50%.  Breathe in steam for 10-15 minutes, 3-4 times a day or as told by your doctor. You can do  this in the bathroom while a hot shower is running.  Try not to spend time in cool or dry air. Rest  Rest as much as possible.  Sleep with your head raised (elevated).  Make sure to get enough sleep each night. General instructions  Put a warm, moist washcloth on your face 3-4 times a day or as told by your doctor. This will help with discomfort.  Wash your hands often with soap and water. If there is no soap and water, use hand sanitizer.  Do not smoke. Avoid being around people who are smoking (secondhand smoke).  Keep all follow-up visits as told by your doctor. This is important. Contact a doctor if:  You have a fever.  Your symptoms get worse.  Your symptoms do not get better within 10 days. Get help right away if:  You have a very bad headache.  You cannot stop throwing up (vomiting).  You have pain or swelling around your face or eyes.  You have trouble seeing.  You feel confused.  Your neck is stiff.  You have trouble breathing. This information is not intended to replace advice given to you by your health care provider. Make sure you discuss any questions you have with your health care provider. Document Released: 05/10/2008 Document Revised: 07/18/2016 Document Reviewed: 09/17/2015 Elsevier Interactive Patient Education  2018 [employer]  

## 2018-01-06 NOTE — Progress Notes (Signed)
Lance Stevens 72 y.o.   Chief Complaint  Patient presents with  . Cough    x 1 week  . Sinus Problem    yellow mucus     HISTORY OF PRESENT ILLNESS: This is a 72 y.o. male complaining of sinus pain and drainage for 1 week.  Has history of the same.  States he gets these once a year.  Sinus Problem  This is a new problem. The current episode started in the past 7 days. The problem has been gradually worsening since onset. There has been no fever. His pain is at a severity of 5/10. The pain is moderate. Associated symptoms include congestion, headaches and sinus pressure. Pertinent negatives include no chills, coughing, ear pain, neck pain, shortness of breath or sore throat.     Prior to Admission medications   Medication Sig Start Date End Date Taking? Authorizing Provider  aspirin EC 325 MG EC tablet Take 1 tablet (325 mg total) by mouth daily. 07/28/16  Yes Lars Pinks M, PA-C  atorvastatin (LIPITOR) 80 MG tablet Take 1 tablet (80 mg total) by mouth daily at 6 PM. 11/30/17  Yes Jeffery, Chelle, PA-C  metFORMIN (GLUCOPHAGE-XR) 500 MG 24 hr tablet Take 1 tablet (500 mg total) by mouth daily with breakfast. 12/16/17  Yes Jeffery, Chelle, PA-C  metoprolol succinate (TOPROL-XL) 50 MG 24 hr tablet Take 1 tablet (50 mg total) by mouth daily. 11/30/17  Yes Jeffery, Chelle, PA-C  blood glucose meter kit and supplies KIT Dispense based on patient and insurance preference. Use up to four times daily as directed. (FOR ICD-9 250.00, 250.01). 08/06/16   Nani Skillern, PA-C    Allergies  Allergen Reactions  . Penicillins Swelling and Other (See Comments)    As a child. Has patient had a PCN reaction causing immediate rash, facial/tongue/throat swelling, SOB or lightheadedness with hypotension: Yes Has patient had a PCN reaction causing severe rash involving mucus membranes or skin necrosis: No Has patient had a PCN reaction that required hospitalization: Was already in hospital  when reaction happened Has patient had a PCN reaction occurring within the last 10 years: No  If all of the above answers are "NO", then may proceed with Cephalosporin use.   Marland Kitchen Lisinopril Other (See Comments)    Sexual side effects    Patient Active Problem List   Diagnosis Date Noted  . Atrial fibrillation with rapid ventricular response ([place]) 07/26/2016  . S/P CABG (coronary artery bypass graft) 07/23/2016  . Essential hypertension 07/17/2016  . CKD (chronic kidney disease), stage II 07/12/2016  . CAD (coronary artery disease) 07/12/2016  . Prostate cancer ([place]) 04/05/2016  . 72 yo man with stage T2a adenocarcinoma of the prostate with a Gleason's Score of 4+5 and a PSA of 8.13 - High Risk 03/19/2016  . Hypertriglyceridemia 12/11/2015  . Diabetes mellitus type 2 in nonobese ([place]) 05/27/2015  . Need for shingles vaccine 05/27/2015  . Family history of Parkinson's disease 05/27/2015  . History of nephrolithiasis 05/27/2015    Past Medical History:  Diagnosis Date  . Allergy   . Anginal pain ([place])   . Borderline diabetes   . Bronchitis    recurrent  . CAD in native artery    a. Multivessel CAD by cath 07/2016 - tx with medical therapy with consideration of CABG for refractory angina.  . Essential hypertension   . Fracture, finger    LEFT HAND WITH SWELLING AND BRUISING  . Heart murmur    ?  teen  . History of gout   . History of kidney stones   . History of kidney stones   . History of skin cancer    possible - pt not sure of dx  . Hyperlipidemia   . Pneumonia    hx  . Prostate cancer [facility])    a. s/p radical prostatectomy 04/2016.  Marland Kitchen Sinus infection    recurrent    Past Surgical History:  Procedure Laterality Date  . CARDIAC CATHETERIZATION N/A 07/12/2016   Procedure: Left Heart Cath and Coronary Angiography;  Surgeon: Peter M Martinique, MD;  Location: [place] [facility];  Service: Cardiovascular;  Laterality: N/A;  . CARDIAC SURGERY    . CORONARY ARTERY BYPASS GRAFT  N/A 07/23/2016   Procedure: CORONARY ARTERY BYPASS GRAFTING (CABG), ON PUMP, TIMES SIX, USING LEFT INTERNAL MAMMARY ARTERY, RIGHT GREATER SAPHENOUS VEIN HARVESTED ENDOSCOPICALLY;  Surgeon: Grace Isaac, MD;  Location: [place];  Service: Open Heart Surgery;  Laterality: N/A;  LIMA to LAD, SVG to DIAG, SEQ SVG to INTERMEDIATE and CIRC, SVG to DISTAL RIGHT, SVG to MID POSTERIOR LATERAL.  Marland Kitchen EYE SURGERY     B cataracts removed.  Marland Kitchen EYE SURGERY     lens implants  . LITHOTRIPSY     89  . LYMPHADENECTOMY Bilateral 04/05/2016   Procedure: LYMPHADENECTOMY, BILATERAL PELVIC LYMPHADENECTOMY;  Surgeon: Raynelle Bring, MD;  Location: [facility];  Service: Urology;  Laterality: Bilateral;  . PROSTATE BIOPSY    . ROBOT ASSISTED LAPAROSCOPIC RADICAL PROSTATECTOMY N/A 04/05/2016   Procedure: XI ROBOTIC ASSISTED LAPAROSCOPIC RADICAL PROSTATECTOMY LEVEL 2;  Surgeon: Raynelle Bring, MD;  Location: [facility];  Service: Urology;  Laterality: N/A;  . TEE WITHOUT CARDIOVERSION N/A 07/23/2016   Procedure: TRANSESOPHAGEAL ECHOCARDIOGRAM (TEE);  Surgeon: Grace Isaac, MD;  Location: [place];  Service: Open Heart Surgery;  Laterality: N/A;  . TONSILLECTOMY AND ADENOIDECTOMY     also received radiation treatments, as was frequent in children at the time  . WISDOM TOOTH EXTRACTION      Social History   Socioeconomic History  . Marital status: Married    Spouse name: [name]  . Number of children: 3  . Years of education: Not on file  . Highest education level: Professional school degree (e.g., MD, DDS, DVM, JD)  Social Needs  . Financial resource strain: Not hard at all  . Food insecurity - worry: Never true  . Food insecurity - inability: Never true  . Transportation needs - medical: No  . Transportation needs - non-medical: No  Occupational History  . 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 Smoking status: Never Smoker  . Smokeless tobacco:  Never Used  Substance and Sexual Activity  . Alcohol use: Yes    Alcohol/week: 1.2 oz    Types: 2 Standard drinks or equivalent per week    Comment: wine - red 2 glasses wine per week  . Drug use: No  . Sexual activity: Yes    Partners: Female  Other Topics Concern  . Not on file  Social History Narrative   Marital status: married x 25 years; second marriage; first marriage x 12 years.      Children: 3 daughters and one step-daughter; 4 grandchildren      Lives: with wife.      Employment: semi-retired; Company secretary; professor; Social worker, Probation officer.      Tobacco:  no      Alcohol: about 2 glasses  of wine/week      Exercise: active in the yard.    Family History  Problem Relation Age of Onset  . Heart attack Father   . Heart disease Father 98       AMI as cause of death  . Diabetes Father   . Hyperlipidemia Father   . Parkinson's disease Brother   . Diabetes Brother   . Cancer Brother        melanoma retina  . Heart disease Brother   . Stroke Maternal Grandmother   . Cancer Mother 32       Hodgkins   . Glaucoma Mother   . Colon cancer Neg Hx   . Colon polyps Neg Hx      Review of Systems  Constitutional: Negative for chills and fever.  HENT: Positive for congestion, sinus pressure and sinus pain. Negative for ear pain, nosebleeds and sore throat.   Eyes: Negative for blurred vision, discharge and redness.  Respiratory: Negative for cough, hemoptysis and shortness of breath.   Cardiovascular: Negative for chest pain, palpitations, claudication and leg swelling.  Gastrointestinal: Negative for abdominal pain, nausea and vomiting.  Musculoskeletal: Negative for back pain, myalgias and neck pain.  Skin: Negative for rash.  Neurological: Positive for headaches. Negative for dizziness, sensory change and focal weakness.  Endo/Heme/Allergies: Negative.   All other systems reviewed and are negative.   Vitals:   01/06/18 1222  BP: 126/66  Pulse: 61  Resp: 16  Temp: 97.6 F  (36.4 C)  SpO2: 98%    Physical Exam  Constitutional: He is oriented to person, place, and time. He appears well-developed and well-nourished.  HENT:  Head: Normocephalic and atraumatic.  Right Ear: External ear normal.  Left Ear: External ear normal.  Nose: Right sinus exhibits maxillary sinus tenderness. Left sinus exhibits maxillary sinus tenderness.  Mouth/Throat: Oropharynx is clear and moist.  Eyes: Conjunctivae and EOM are normal. Pupils are equal, round, and reactive to light.  Neck: Normal range of motion. Neck supple. No thyromegaly present.  Cardiovascular: Normal rate, regular rhythm and normal heart sounds.  Pulmonary/Chest: Effort normal and breath sounds normal.  Abdominal: Soft. There is no tenderness.  Musculoskeletal: Normal range of motion.  Lymphadenopathy:    He has no cervical adenopathy.  Neurological: He is alert and oriented to person, place, and time. No sensory deficit. He exhibits normal muscle tone.  Skin: Skin is warm and dry. Capillary refill takes less than 2 seconds. No rash noted.  Psychiatric: He has a normal mood and affect. His behavior is normal.  Vitals reviewed.    ASSESSMENT & PLAN: Hersey was seen today for cough and sinus problem.  Diagnoses and all orders for this visit:  Acute maxillary sinusitis, recurrence not specified -     azithromycin (ZITHROMAX) 250 MG tablet; Sig as indicated    Patient Instructions       IF you received an x-ray today, you will receive an invoice from [facility]. Please contact [facility] at [phone] with questions or concerns regarding your invoice.   IF you received labwork today, you will receive an invoice from [facility]. Please contact [facility] at [phone] with questions or concerns regarding your invoice.   Our billing staff will not be able to assist you with questions regarding bills from these companies.  You will be contacted with the lab results as soon as  they are available. The fastest way to get your results is to activate your My Chart account. Instructions  are located on the last page of this paperwork. If you have not heard from [place] regarding the results in 2 weeks, please contact this office.     Sinusitis, Adult Sinusitis is soreness and inflammation of your sinuses. Sinuses are hollow spaces in the bones around your face. They are located:  Around your eyes.  In the middle of your forehead.  Behind your nose.  In your cheekbones.  Your sinuses and nasal passages are lined with a stringy fluid (mucus). Mucus normally drains out of your sinuses. When your nasal tissues get inflamed or swollen, the mucus can get trapped or blocked so air cannot flow through your sinuses. This lets bacteria, viruses, and funguses grow, and that leads to infection. Follow these instructions at home: Medicines  Take, use, or apply over-the-counter and prescription medicines only as told by your doctor. These may include nasal sprays.  If you were prescribed an antibiotic medicine, take it as told by your doctor. Do not stop taking the antibiotic even if you start to feel better. Hydrate and Humidify  Drink enough water to keep your pee (urine) clear or pale yellow.  Use a cool mist humidifier to keep the humidity level in your home above 50%.  Breathe in steam for 10-15 minutes, 3-4 times a day or as told by your doctor. You can do this in the bathroom while a hot shower is running.  Try not to spend time in cool or dry air. Rest  Rest as much as possible.  Sleep with your head raised (elevated).  Make sure to get enough sleep each night. General instructions  Put a warm, moist washcloth on your face 3-4 times a day or as told by your doctor. This will help with discomfort.  Wash your hands often with soap and water. If there is no soap and water, use hand sanitizer.  Do not smoke. Avoid being around people who are smoking (secondhand  smoke).  Keep all follow-up visits as told by your doctor. This is important. Contact a doctor if:  You have a fever.  Your symptoms get worse.  Your symptoms do not get better within 10 days. Get help right away if:  You have a very bad headache.  You cannot stop throwing up (vomiting).  You have pain or swelling around your face or eyes.  You have trouble seeing.  You feel confused.  Your neck is stiff.  You have trouble breathing. This information is not intended to replace advice given to you by your health care provider. Make sure you discuss any questions you have with your health care provider. Document Released: 05/10/2008 Document Revised: 07/18/2016 Document Reviewed: 09/17/2015 Elsevier Interactive Patient Education  2018 Elsevier Inc.      Agustina Caroli, MD [facility]

## 2018-02-10 ENCOUNTER — Other Ambulatory Visit: Payer: Self-pay | Admitting: Physician Assistant

## 2018-03-13 ENCOUNTER — Encounter: Payer: Self-pay | Admitting: Physician Assistant

## 2018-05-09 ENCOUNTER — Other Ambulatory Visit: Payer: Self-pay

## 2018-05-09 ENCOUNTER — Encounter: Payer: Self-pay | Admitting: Physician Assistant

## 2018-05-09 ENCOUNTER — Ambulatory Visit: Payer: Medicare Other | Admitting: Physician Assistant

## 2018-05-09 VITALS — BP 120/62 | HR 67 | Temp 97.8°F | Resp 16 | Ht 69.0 in | Wt 197.6 lb

## 2018-05-09 DIAGNOSIS — N182 Chronic kidney disease, stage 2 (mild): Secondary | ICD-10-CM | POA: Diagnosis not present

## 2018-05-09 DIAGNOSIS — I10 Essential (primary) hypertension: Secondary | ICD-10-CM | POA: Diagnosis not present

## 2018-05-09 DIAGNOSIS — E781 Pure hyperglyceridemia: Secondary | ICD-10-CM

## 2018-05-09 DIAGNOSIS — I2510 Atherosclerotic heart disease of native coronary artery without angina pectoris: Secondary | ICD-10-CM

## 2018-05-09 DIAGNOSIS — E119 Type 2 diabetes mellitus without complications: Secondary | ICD-10-CM | POA: Diagnosis not present

## 2018-05-09 MED ORDER — NITROGLYCERIN 0.4 MG SL SUBL: 0.4000 mg | SUBLINGUAL_TABLET | SUBLINGUAL | 3 refills | Status: AC | PRN

## 2018-05-09 NOTE — Progress Notes (Signed)
Subjective:    Patient ID: Lance Stevens, male    DOB: March 06, 1946, 72 y.o.   MRN: [id number]  Patient presents for glucose recheck. States he checked this morning and it was 119. He notes being a bit "sloppy" the past few weeks with diet and monitoring and glucose subsequently increased to 200's. He has been more prudent with his dietary habits as of late. He has been exercising regularly and he has a goal of decreasing his weight to 175lb and would like to discuss details of how to accomplish this. He states he has been told if he attains this goal he will no longer have to be on meds. He monitors his blood pressure regularly at home, but notes if he works out in the morning he does not have to take his bp pill that day. 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  He would like a rx for nitroglycerin as he has misplaced his current bottle of medication. He had an incident of a foot injury/infection after doing some yard work, and he self medicated with unknown abx he had received 2 years prior from heart surgery. He has also self medicated with same abx for subjective sinusitis. Chart reviewed and was put on Bactrim for post op incision drainage in 2017 Of note, patient would like pcn allergy removed from his allergy list because he states he has taken abx with penicillin in them and has had no reaction.  He is currently not seeing a podiatrist.  Hyperglycemia  Associated symptoms include congestion. Pertinent negatives include no abdominal pain, chest pain, chills, fatigue, fever, headaches, nausea, numbness, rash, sore throat or vomiting.      Review of Systems  Constitutional: Negative for chills, fatigue, fever and unexpected weight change.  HENT: Positive for congestion and rhinorrhea. Negative for sore throat.   Eyes: Negative for pain and visual disturbance.  Respiratory: Positive for shortness of breath  (with several flights of stairs). Negative for chest tightness.   Cardiovascular: Negative for chest pain and palpitations.  Gastrointestinal: Negative for abdominal pain, blood in stool, constipation, diarrhea, nausea and vomiting.  Endocrine: Negative for polyuria.  Genitourinary: Negative for dysuria, frequency and hematuria.  Skin: Negative for color change and rash.  Neurological: Negative for dizziness, numbness and headaches.       Objective:   Physical Exam  Constitutional: He appears well-developed and well-nourished.  HENT:  Head: Normocephalic and atraumatic.  Eyes: Pupils are equal, round, and reactive to light.  Neck: No thyromegaly present.  Cardiovascular: Normal rate, regular rhythm and normal heart sounds. Exam reveals no gallop and no friction rub.  No murmur heard. Pulmonary/Chest: Effort normal and breath sounds normal. No stridor. He has no wheezes. He has no rales.  Feet:  Right Foot:  Protective Sensation: 10 sites tested. 10 sites sensed.  Left Foot:  Protective Sensation: 10 sites tested. 10 sites sensed.  Lymphadenopathy:    He has no cervical adenopathy.  Skin: Skin is dry.  Right arm: cool and radial pulse not palpated Left arm : warm and dry with 2+ radial pulse   Patient Active Problem List   Diagnosis Date Noted  . Acute maxillary sinusitis 01/06/2018  . Atrial fibrillation with rapid ventricular response ([place]) 07/26/2016  . S/P CABG (coronary artery bypass graft) 07/23/2016  . Essential hypertension 07/17/2016  . CKD (chronic kidney disease), stage II 07/12/2016  . CAD (coronary  artery disease) 07/12/2016  . Prostate cancer ([place]) 04/05/2016  . 72 yo man with stage T2a adenocarcinoma of the prostate with a Gleason's Score of 4+5 and a PSA of 8.13 - High Risk 03/19/2016  . Hypertriglyceridemia 12/11/2015  . Diabetes mellitus type 2 in nonobese ([place]) 05/27/2015  . Need for shingles vaccine 05/27/2015  . Family history of Parkinson's disease  05/27/2015  . History of nephrolithiasis 05/27/2015   Prior to Admission medications   Medication Sig Start Date End Date Taking? Authorizing Provider  aspirin EC 325 MG EC tablet Take 1 tablet (325 mg total) by mouth daily. 07/28/16   Nani Skillern, PA-C  atorvastatin (LIPITOR) 80 MG tablet Take 1 tablet (80 mg total) by mouth daily at 6 PM. 11/30/17   Harrison Mons, PA-C  blood glucose meter kit and supplies KIT Dispense based on patient and insurance preference. Use up to four times daily as directed. (FOR ICD-9 250.00, 250.01). 08/06/16   Nani Skillern, PA-C  metFORMIN (GLUCOPHAGE-XR) 500 MG 24 hr tablet Take 1 tablet (500 mg total) by mouth daily with breakfast. 12/16/17   Harrison Mons, PA-C  metoprolol succinate (TOPROL-XL) 50 MG 24 hr tablet Take 1 tablet (50 mg total) by mouth daily. 11/30/17   Harrison Mons, PA-C  nitroGLYCERIN (NITROSTAT) 0.4 MG SL tablet Place 1 tablet (0.4 mg total) under the tongue every 5 (five) minutes as needed for chest pain. 05/09/18   Harrison Mons, PA-C   Allergies  Allergen Reactions  . Penicillins Swelling and Other (See Comments)    As a child. Has patient had a PCN reaction causing immediate rash, facial/tongue/throat swelling, SOB or lightheadedness with hypotension: Yes Has patient had a PCN reaction causing severe rash involving mucus membranes or skin necrosis: No Has patient had a PCN reaction that required hospitalization: Was already in hospital when reaction happened Has patient had a PCN reaction occurring within the last 10 years: No  If all of the above answers are "NO", then may proceed with Cephalosporin use.   Marland Kitchen Lisinopril Other (See Comments)    Sexual side effects          Assessment & Plan:  1. Diabetes mellitus type 2 in nonobese (HCC) A1C elevated at last visit in December (7.3); continue current regimen and reevaluate based on pending labs: - Comprehensive metabolic panel - Hemoglobin A1c  2. CKD  (chronic kidney disease), stage II Well controlled, continue current regimen: - CBC with Differential/Platelet - Comprehensive metabolic panel  3. Hypertriglyceridemia Well controlled, continue current regimen - Comprehensive metabolic panel - Lipid panel  4. Essential hypertension Well controlled, continue current medication. -Follow up with cardiologist to reevaluate metoprolol dosage. Until then continue taking daily as directed. - CBC with Differential/Platelet - Comprehensive metabolic panel  5. Coronary artery disease involving native coronary artery of native heart without angina pectoris Make appointment with cardiologist to reevaluate status.  - nitroGLYCERIN (NITROSTAT) 0.4 MG SL tablet; Place 1 tablet (0.4 mg total) under the tongue every 5 (five) minutes as needed for chest pain.  Dispense: 25 tablet; Refill: 3  Continue trying to exercise regularly, and increase lean meat and vegetable consumption.   Return in about 3 months (around 08/09/2018) for re-evalaution of diabetes, blood pressure, cholesterol.

## 2018-05-09 NOTE — Patient Instructions (Addendum)
Please call Dr. 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    IF you received an x-ray today, you will receive an invoice from [facility]. Please contact [facility] at [phone] with questions or concerns regarding your invoice.   IF you received labwork today, you will receive an invoice from [place]. Please contact [facility] at [phone] with questions or concerns regarding your invoice.   Our billing staff will not be able to assist you with questions regarding bills from these companies.  You will be contacted with the lab results as soon as they are available. The fastest way to get your results is to activate your My Chart account. Instructions are located on the last page of this paperwork. If you have not heard from [place] regarding the results in 2 weeks, please contact this office.    Antibiotics Aren't Always The Answer  CDC Urges Public To Be Antibiotics Aware The Centers for Disease Control and Prevention (CDC) encourages patients and families to Be Antibiotics Aware by learning about safe antibiotic use. Each year in the [place], at least 2 million people get infected with antibiotic-resistant bacteria. At least 23,000 die as a result. Antibiotic resistance, one of the most urgent threats to the public's health, occurs when bacteria develop the ability to defeat the drugs designed to kill them.  What Do Antibiotics Treat? When you get a prescription for antibiotics, follow your doctor's instructions carefully.  Antibiotics are critical tools for treating a number of common infections, such as pneumonia, and for life-threatening conditions including sepsis. Antibiotics are only needed for treating certain infections caused by bacteria.  What Don't Antibiotics Treat? Antibiotics do not work on viruses, such as colds and flu, or runny noses, even if the mucus is thick, yellow or  green. Antibiotics also won't help some common bacterial infections including most cases of bronchitis, many sinus infections, and some ear infections.  What Are The Side Effects of Antibiotics? Any time antibiotics are used, they can cause side effects and lead to antibiotic resistance. When antibiotics aren't needed, they won't help you, and the side effects could still hurt you. Common side effects range from things like rashes and yeast infections to severe health problems. More serious side effects include Clostridium difficile infection (also called C. difficile or C. diff), which causes diarrhea that can lead to severe colon damage and death. If you need antibiotics, take them exactly as prescribed. Patients and families can talk to their healthcare professional if they have any questions about their antibiotics, or if they develop side effects, especially diarrhea, since that could be C. difficile, which needs to be treated.  Can I Feel Better Without Antibiotics? Patients and families can ask their healthcare professional about the best way to feel better while their body fights off the virus. Respiratory viruses usually go away in a week or two without treatment.  How Can I Stay Healthy? Regular hand-washing can go a long way toward protecting you from germs.  We can all stay healthy and keep others healthy by cleaning our hands, covering our coughs, staying home when sick, and getting recommended vaccines, for the flu, for example. Antibiotics save lives. When a patient needs antibiotics, the benefits outweigh the risks of side effects and antibiotic resistance. Improving the way we take antibiotics helps keep [place] healthy now, helps fight antibiotic resistance, and ensures that life-saving antibiotics will be available for future generations.  To  learn more about antibiotic prescribing and use, visit [employer].

## 2018-05-09 NOTE — Progress Notes (Signed)
Patient ID: Lance Stevens, male    DOB: 1946-09-01, 72 y.o.   MRN: [id number]  PCP: Harrison Mons, PA-C  Chief Complaint  Patient presents with  . Hyperglycemia    follow up , would like RX for Nitrostat     Subjective:   Presents for evaluation of diabetes.  Overall he feels he is doing well. eating a little "sloppy" lately, with glucose readings in the 200's. Exercising regularly. Goal weight of 175 lbs. Hopes to be able to eliminate medications. Notes that when he exercises in the mornings, he doesn't have to take his BP medication. Checks BP BID, with readings 110's/60's, pulse generally upper 50's. 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  Last visit with cardiology was 10/2017, and CTA 12/2017 and 04/2018 follow-up planned. He has had neither.  Uses azithromycin periodically for self-diagnosed respiratory and skin infections. Recent allergy flare, sinusitis and toe cellulitis resolved nicely with treatment. Chart review reveals an Rx for SMX-TMP 07/2016 for post-op incision drainage following CABG.  He requests that penicillin be removed from his allergy list, stating that he has taken penicillin without difficulty.  Review of Systems Constitutional: Negative for chills, fatigue, fever and unexpected weight change.  HENT: Positive for congestion and rhinorrhea. Negative for sore throat.   Eyes: Negative for pain and visual disturbance.  Respiratory: Positive for shortness of breath (with several flights of stairs). Negative for chest tightness.   Cardiovascular: Negative for chest pain and palpitations.  Gastrointestinal: Negative for abdominal pain, blood in stool, constipation, diarrhea, nausea and vomiting.  Endocrine: Negative for polyuria.  Genitourinary: Negative for dysuria, frequency and hematuria.  Skin: Negative for color change and rash.  Neurological: Negative for dizziness,  numbness and headaches.     Patient Active Problem List   Diagnosis Date Noted  . Acute maxillary sinusitis 01/06/2018  . Atrial fibrillation with rapid ventricular response ([place]) 07/26/2016  . S/P CABG (coronary artery bypass graft) 07/23/2016  . Essential hypertension 07/17/2016  . CKD (chronic kidney disease), stage II 07/12/2016  . CAD (coronary artery disease) 07/12/2016  . Prostate cancer ([place]) 04/05/2016  . 72 yo man with stage T2a adenocarcinoma of the prostate with a Gleason's Score of 4+5 and a PSA of 8.13 - High Risk 03/19/2016  . Hypertriglyceridemia 12/11/2015  . Diabetes mellitus type 2 in nonobese ([place]) 05/27/2015  . Need for shingles vaccine 05/27/2015  . Family history of Parkinson's disease 05/27/2015  . History of nephrolithiasis 05/27/2015     Prior to Admission medications   Medication Sig Start Date End Date Taking? Authorizing Provider  aspirin EC 325 MG EC tablet Take 1 tablet (325 mg total) by mouth daily. 07/28/16   Nani Skillern, PA-C  atorvastatin (LIPITOR) 80 MG tablet Take 1 tablet (80 mg total) by mouth daily at 6 PM. 11/30/17   Harrison Mons, PA-C  azithromycin (ZITHROMAX) 250 MG tablet Sig as indicated Patient not taking: Reported on 05/09/2018 01/06/18   Horald Pollen, MD  blood glucose meter kit and supplies KIT Dispense based on patient and insurance preference. Use up to four times daily as directed. (FOR ICD-9 250.00, 250.01). 08/06/16   Nani Skillern, PA-C  metFORMIN (GLUCOPHAGE-XR) 500 MG 24 hr tablet Take 1 tablet (500 mg total) by mouth daily with breakfast. 12/16/17   Harrison Mons, PA-C  metoprolol succinate (TOPROL-XL) 50 MG 24 hr tablet Take 1 tablet (50  mg total) by mouth daily. 11/30/17   Harrison Mons, PA-C     Allergies  Allergen Reactions  . Penicillins Swelling and Other (See Comments)    As a child. Has patient had a PCN reaction causing immediate rash, facial/tongue/throat swelling, SOB or  lightheadedness with hypotension: Yes Has patient had a PCN reaction causing severe rash involving mucus membranes or skin necrosis: No Has patient had a PCN reaction that required hospitalization: Was already in hospital when reaction happened Has patient had a PCN reaction occurring within the last 10 years: No  If all of the above answers are "NO", then may proceed with Cephalosporin use.   Marland Kitchen Lisinopril Other (See Comments)    Sexual side effects       Objective:  Physical Exam  Constitutional: He is oriented to person, place, and time. He appears well-developed and well-nourished. He is active and cooperative. No distress.  BP 120/62   Pulse 67   Temp 97.8 F (36.6 C)   Resp 16   Ht '5\' 9"'  (1.753 m)   Wt 197 lb 9.6 oz (89.6 kg)   SpO2 97%   BMI 29.18 kg/m   HENT:  Head: Normocephalic and atraumatic.  Right Ear: Hearing normal.  Left Ear: Hearing normal.  Eyes: Conjunctivae are normal. No scleral icterus.  Neck: Normal range of motion. Neck supple. No thyromegaly present.  Cardiovascular: Normal rate, regular rhythm and normal heart sounds.  Pulses:      Radial pulses are 1+ on the right side, and 2+ on the left side.  Pulmonary/Chest: Effort normal and breath sounds normal.  Lymphadenopathy:       Head (right side): No tonsillar, no preauricular, no posterior auricular and no occipital adenopathy present.       Head (left side): No tonsillar, no preauricular, no posterior auricular and no occipital adenopathy present.    He has no cervical adenopathy.       Right: No supraclavicular adenopathy present.       Left: No supraclavicular adenopathy present.  Neurological: He is alert and oriented to person, place, and time. No sensory deficit.  Skin: Skin is warm, dry and intact. No rash noted. No cyanosis or erythema. Nails show no clubbing.  Psychiatric: He has a normal mood and affect. His speech is normal and behavior is normal.   Wt Readings from Last 3 Encounters:    05/09/18 197 lb 9.6 oz (89.6 kg)  01/06/18 200 lb (90.7 kg)  11/30/17 196 lb 6.4 oz (89.1 kg)       Assessment & Plan:   Problem List Items Addressed This Visit    Hypertriglyceridemia    Await lab results.  Continue atorvastatin.      Relevant Medications   nitroGLYCERIN (NITROSTAT) 0.4 MG SL tablet   Other Relevant Orders   Comprehensive metabolic panel (Completed)   Lipid panel (Completed)   Essential hypertension    Well-controlled.  Continue metoprolol succinate.  Continue healthy lifestyle modifications.      Relevant Medications   nitroGLYCERIN (NITROSTAT) 0.4 MG SL tablet   Other Relevant Orders   CBC with Differential/Platelet (Completed)   Comprehensive metabolic panel (Completed)   Diabetes mellitus type 2 in nonobese [facility]) - Primary    Await lab results.  Anticipate control.  Continue healthy lifestyle changes.      Relevant Orders   Comprehensive metabolic panel (Completed)   Hemoglobin A1c (Completed)   CKD (chronic kidney disease), stage II    Continue good hypertension and  diabetes control.      Relevant Orders   CBC with Differential/Platelet (Completed)   Comprehensive metabolic panel (Completed)   CAD (coronary artery disease)    Encourage follow-up with cardiology.  I refilled his nitroglycerin.  Continue efforts for healthy lifestyle modification and good control of diabetes.      Relevant Medications   nitroGLYCERIN (NITROSTAT) 0.4 MG SL tablet       Return in about 3 months (around 08/09/2018) for re-evalaution of diabetes, blood pressure, cholesterol.   Fara Chute, PA-C [facility] [place]

## 2018-05-10 LAB — CBC WITH DIFFERENTIAL/PLATELET
BASOS: 0 %
Basophils Absolute: 0 10*3/uL (ref 0.0–0.2)
EOS (ABSOLUTE): 0.2 10*3/uL (ref 0.0–0.4)
EOS: 2 %
Hematocrit: 42.3 % (ref 37.5–51.0)
Hemoglobin: 14.7 g/dL (ref 13.0–17.7)
IMMATURE GRANS (ABS): 0 10*3/uL (ref 0.0–0.1)
IMMATURE GRANULOCYTES: 0 %
LYMPHS: 19 %
Lymphocytes Absolute: 2.1 10*3/uL (ref 0.7–3.1)
MCH: 31.7 pg (ref 26.6–33.0)
MCHC: 34.8 g/dL (ref 31.5–35.7)
MCV: 91 fL (ref 79–97)
Monocytes Absolute: 0.9 10*3/uL (ref 0.1–0.9)
Monocytes: 8 %
NEUTROS ABS: 7.7 10*3/uL — AB (ref 1.4–7.0)
NEUTROS PCT: 71 %
Platelets: 174 10*3/uL (ref 150–450)
RBC: 4.64 x10E6/uL (ref 4.14–5.80)
RDW: 13.8 % (ref 12.3–15.4)
WBC: 10.9 10*3/uL — ABNORMAL HIGH (ref 3.4–10.8)

## 2018-05-10 LAB — COMPREHENSIVE METABOLIC PANEL
A/G RATIO: 2.2 (ref 1.2–2.2)
ALBUMIN: 4.3 g/dL (ref 3.5–4.8)
ALT: 22 IU/L (ref 0–44)
AST: 11 IU/L (ref 0–40)
Alkaline Phosphatase: 81 IU/L (ref 39–117)
BILIRUBIN TOTAL: 1.7 mg/dL — AB (ref 0.0–1.2)
BUN / CREAT RATIO: 15 (ref 10–24)
BUN: 16 mg/dL (ref 8–27)
CALCIUM: 9.4 mg/dL (ref 8.6–10.2)
CHLORIDE: 105 mmol/L (ref 96–106)
CO2: 22 mmol/L (ref 20–29)
Creatinine, Ser: 1.06 mg/dL (ref 0.76–1.27)
GFR, EST AFRICAN AMERICAN: 81 mL/min/{1.73_m2} (ref >59)
GFR, EST NON AFRICAN AMERICAN: 70 mL/min/{1.73_m2} (ref >59)
Globulin, Total: 2 g/dL (ref 1.5–4.5)
Glucose: 127 mg/dL — ABNORMAL HIGH (ref 65–99)
Potassium: 4.7 mmol/L (ref 3.5–5.2)
Sodium: 141 mmol/L (ref 134–144)
TOTAL PROTEIN: 6.3 g/dL (ref 6.0–8.5)

## 2018-05-10 LAB — HEMOGLOBIN A1C
Est. average glucose Bld gHb Est-mCnc: 154 mg/dL
Hgb A1c MFr Bld: 7 % — ABNORMAL HIGH (ref 4.8–5.6)

## 2018-05-10 LAB — LIPID PANEL
CHOL/HDL RATIO: 2.9 ratio (ref 0.0–5.0)
Cholesterol, Total: 86 mg/dL — ABNORMAL LOW (ref 100–199)
HDL: 30 mg/dL — AB (ref >39)
LDL Calculated: 38 mg/dL (ref 0–99)
Triglycerides: 91 mg/dL (ref 0–149)
VLDL CHOLESTEROL CAL: 18 mg/dL (ref 5–40)

## 2018-05-13 ENCOUNTER — Encounter: Payer: Self-pay | Admitting: Physician Assistant

## 2018-05-13 NOTE — Assessment & Plan Note (Signed)
Await lab results.  Anticipate control.  Continue healthy lifestyle changes.

## 2018-05-13 NOTE — Assessment & Plan Note (Addendum)
Well-controlled.  Continue metoprolol succinate.  Continue healthy lifestyle modifications.

## 2018-05-13 NOTE — Assessment & Plan Note (Signed)
Encourage follow-up with cardiology.  I refilled his nitroglycerin.  Continue efforts for healthy lifestyle modification and good control of diabetes.

## 2018-05-13 NOTE — Assessment & Plan Note (Signed)
Continue good hypertension and diabetes control.

## 2018-05-13 NOTE — Assessment & Plan Note (Signed)
Await lab results.  Continue atorvastatin.

## 2018-07-11 NOTE — Progress Notes (Signed)
GU Location of Tumor / Histology: Prostatic adenocarcinoma   If Prostate Cancer, Gleason Score is (4 +5 ) and PSA is (8.13) on 01-2016, 06-28-18 PSA 0.25    10-20-16        PSA   0.23 06-10-16        PSA   0.01 04-21-17        PSA   0.036 12-01-17        PSA    0.14  03-14-18       PSA    0.14 06-28-18        PSA    0.25 IS FINAL DIAGNOSIS Lance Stevens presented months ago with signs/symptoms of:  He was noted to have an elevated PSA of 11.77 by his PCP, PA Jeffery/Dr. Tamala Julian on 12/09/15.  Accordingly, he was referred for evaluation in urology by Dr. Junious Silk on 01/21/16,  digital rectal examination was performed at that time revealing some nodularity of the left apex. Repeat PSA check at that time was 8.13.  Biopsies of  (if applicable) revealed:   Biopsies of prostate revealed:               Past/Anticipated interventions by urology, if any:   FINAL DIAGNOSIS Diagnosis  04-05-18 Dr. Raynelle Bring 1. Prostate, radical resection PROSTATIC ADENOCARCINOMA, GLEASON SCORE 4+5=9 EXTRAPROSTATIC EXTENSION IS IDENTIFIED AT LEFT POSTEROLATERAL APEX (PT3A) SEMINAL VESICLES, VASA DEFERENTIA AND ALL SURGICAL MARGINS ARE NEGATIVE FOR TUMOR 2. Lymph nodes, regional resection, right pelvic NINE BENIGN LYMPH NODES (0/9) 3. Lymph nodes, regional resection, left pelvic TWELVE BENIGN LYMPH NODES (0/12) Microscopic Comment 1. PROSTATE RADICAL RESECTION/PROSTATECTOMY  Past/Anticipated interventions by medical oncology, if any: No  Weight changes, if any: Wt Readings from Last 3 Encounters:  07/20/18 204 lb 9.6 oz (92.8 kg)  05/09/18 197 lb 9.6 oz (89.6 kg)  01/06/18 200 lb (90.7 kg)    Bowel/Bladder complaints, if JOI:TGPQ  4, nocturia x 2. Denies having issues with urinary frequency and urgency         Reports occasional urgency, nocturia x        2-4,occasional frequency, and ED.                 Denies dysuriaor hematuria. Nausea/Vomiting, if any: No  Pain issues, if any:  No  SAFETY ISSUES:  Prior radiation? :No  Pacemaker/ICD? : No  Possible current pregnancy?: No  Is the patient on methotrexate? : No  Current Complaints / other details: 72 year old male. Works as a Horticulturist, commercial. CT and bone scan negative. Married with three daughters. DI:YMEBRAXENM.  BP 137/88 (BP Location: Left Arm, Patient Position: Sitting)   Temp 98.3 F (36.8 C) (Oral)   Resp 20   Ht 5\' 9"  (1.753 m)   Wt 204 lb 9.6 oz (92.8 kg)   SpO2 99%   BMI 30.21 kg/m

## 2018-07-20 ENCOUNTER — Ambulatory Visit
Admission: RE | Admit: 2018-07-20 | Discharge: 2018-07-20 | Disposition: A | Discharge status: Home / Self Care | Payer: Medicare Other | Source: Ambulatory Visit | Attending: Urology | Admitting: Urology

## 2018-07-20 ENCOUNTER — Ambulatory Visit
Admission: RE | Admit: 2018-07-20 | Discharge: 2018-07-20 | Disposition: A | Discharge status: Home / Self Care | Payer: Medicare Other | Source: Ambulatory Visit | Attending: Radiation Oncology | Admitting: Radiation Oncology

## 2018-07-20 ENCOUNTER — Other Ambulatory Visit: Payer: Self-pay

## 2018-07-20 ENCOUNTER — Encounter: Payer: Self-pay | Admitting: Medical Oncology

## 2018-07-20 VITALS — BP 137/88 | Temp 98.3°F | Resp 20 | Ht 69.0 in | Wt 204.6 lb

## 2018-07-20 DIAGNOSIS — C61 Malignant neoplasm of prostate: Secondary | ICD-10-CM

## 2018-07-20 DIAGNOSIS — Z951 Presence of aortocoronary bypass graft: Secondary | ICD-10-CM | POA: Insufficient documentation

## 2018-07-20 DIAGNOSIS — I2510 Atherosclerotic heart disease of native coronary artery without angina pectoris: Secondary | ICD-10-CM | POA: Insufficient documentation

## 2018-07-20 DIAGNOSIS — R7303 Prediabetes: Secondary | ICD-10-CM | POA: Diagnosis not present

## 2018-07-20 NOTE — Progress Notes (Signed)
Radiation Oncology         [phone] ________________________________  Follow up New Outpatient Consult Visit  Name: Lance Stevens MRN: [id number]  Date: 07/20/2018  DOB: 03-Feb-1946  YE:BXIDHWY, Chelle, PA-C  Raynelle Bring, MD   REFERRING PHYSICIAN: Raynelle Bring, MD  DIAGNOSIS: 72 y.o. gentleman with biochemically recurrent prostate cancer with detectable, rising postoperative PSA currently at 0.25.    ICD-10-CM   1. Recurrent prostate cancer ([place]) C61     HISTORY OF PRESENT ILLNESS: Lance Stevens is a 72 y.o. male with a diagnosis of prostate cancer.  We initially met him in the prostate multidisciplinary clinic in April 2017 for discussion of his newly diagnosed cT2a, Gleason 4+5 adenocarcinoma the prostate with pretreatment PSA of 8.13.  At that time, he elected to proceed with surgical management of his disease and underwent a UNS RALP with BPLND on 04/05/16.  Final pathology revealed a PT3AN0, Gleason 4+5 with negative surgical margins no seminal vesicle involvement and 0 of 21 nodes removed involved.  However, there was extraprostatic extension at the left posterior lateral apex.  His initial postoperative PSA was undetectable and remained undetectable until December 2018 when it was noted at 0.14.  A repeat PSA on 03/14/2018 remained stable at 0.14 but more recently, on 06/28/2018 his PSA was noted to be further elevated at 0.25.  The patient reviewed the surgical pathology and recent PSA lab results with his urologist and he has kindly been referred back today for discussion of potential salvage radiation treatment options.  PREVIOUS RADIATION THERAPY: No  PAST MEDICAL HISTORY:  Past Medical History:  Diagnosis Date  . Acute maxillary sinusitis 01/06/2018  . Allergy   . Anginal pain ([place])   . Borderline diabetes   . Bronchitis    recurrent  . CAD in native artery    a. Multivessel CAD by cath 07/2016 - tx with medical therapy with consideration of CABG for refractory  angina.  . Essential hypertension   . Fracture, finger    LEFT HAND WITH SWELLING AND BRUISING  . Heart murmur    ? teen  . History of gout   . History of kidney stones   . History of kidney stones   . History of skin cancer    possible - pt not sure of dx  . Hyperlipidemia   . Pneumonia    hx  . Prostate cancer [facility])    a. s/p radical prostatectomy 04/2016.  Marland Kitchen Sinus infection    recurrent      PAST SURGICAL HISTORY: Past Surgical History:  Procedure Laterality Date  . CARDIAC CATHETERIZATION N/A 07/12/2016   Procedure: Left Heart Cath and Coronary Angiography;  Surgeon: Peter M Martinique, MD;  Location: [facility];  Service: Cardiovascular;  Laterality: N/A;  . CARDIAC SURGERY    . CORONARY ARTERY BYPASS GRAFT N/A 07/23/2016   Procedure: CORONARY ARTERY BYPASS GRAFTING (CABG), ON PUMP, TIMES SIX, USING LEFT INTERNAL MAMMARY ARTERY, RIGHT GREATER SAPHENOUS VEIN HARVESTED ENDOSCOPICALLY;  Surgeon: Grace Isaac, MD;  Location: [facility];  Service: Open Heart Surgery;  Laterality: N/A;  LIMA to LAD, SVG to DIAG, SEQ SVG to INTERMEDIATE and CIRC, SVG to DISTAL RIGHT, SVG to MID POSTERIOR LATERAL.  Marland Kitchen EYE SURGERY     B cataracts removed.  Marland Kitchen EYE SURGERY     lens implants  . LITHOTRIPSY     98  . LYMPHADENECTOMY Bilateral 04/05/2016   Procedure: LYMPHADENECTOMY, BILATERAL PELVIC LYMPHADENECTOMY;  Surgeon: Raynelle Bring,  MD;  Location: [facility];  Service: Urology;  Laterality: Bilateral;  . PROSTATE BIOPSY    . ROBOT ASSISTED LAPAROSCOPIC RADICAL PROSTATECTOMY N/A 04/05/2016   Procedure: XI ROBOTIC ASSISTED LAPAROSCOPIC RADICAL PROSTATECTOMY LEVEL 2;  Surgeon: Raynelle Bring, MD;  Location: [facility];  Service: Urology;  Laterality: N/A;  . TEE WITHOUT CARDIOVERSION N/A 07/23/2016   Procedure: TRANSESOPHAGEAL ECHOCARDIOGRAM (TEE);  Surgeon: Grace Isaac, MD;  Location: [place];  Service: Open Heart Surgery;  Laterality: N/A;  . TONSILLECTOMY AND ADENOIDECTOMY     also received radiation  treatments, as was frequent in children at the time  . WISDOM TOOTH EXTRACTION      FAMILY HISTORY:  Family History  Problem Relation Age of Onset  . Heart attack Father   . Heart disease Father 37       AMI as cause of death  . Diabetes Father   . Hyperlipidemia Father   . Parkinson's disease Brother   . Diabetes Brother   . Cancer Brother        melanoma retina  . Heart disease Brother   . Stroke Maternal Grandmother   . Cancer Mother 56       Hodgkins   . Glaucoma Mother   . Colon cancer Neg Hx   . Colon polyps Neg Hx     SOCIAL HISTORY:  Social History   Socioeconomic History  . Marital status: Married    Spouse name: [name]  . Number of children: 3  . Years of education: Not on file  . Highest education level: Professional school degree (e.g., MD, DDS, DVM, JD)  Occupational History  . 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 Financial resource strain: Not hard at all  . Food insecurity:    Worry: Never true    Inability: Never true  . Transportation needs:    Medical: No    Non-medical: No  Tobacco Use  . Smoking status: Never Smoker  . Smokeless tobacco: Never Used  Substance and Sexual Activity  . Alcohol use: Yes    Alcohol/week: 2.0 standard drinks    Types: 2 Standard drinks or equivalent per week    Comment: wine - red 2 glasses wine per week  . Drug use: No  . Sexual activity: Yes    Partners: Female  Lifestyle  . Physical activity:    Days per week: 7 days    Minutes per session: 60 min  . Stress: Only a little  Relationships  . Social connections:    Talks on phone: Once a week    Gets together: Once a week    Attends religious service: Never    Active member of club or organization: No    Attends meetings of clubs or organizations: Never    Relationship status: Married  . Intimate partner violence:    Fear of current or ex partner: No    Emotionally abused:  No    Physically abused: No    Forced sexual activity: No  Other Topics Concern  . Not on file  Social History Narrative   Marital status: married x 25 years; second marriage; first marriage x 12 years.      Children: 3 daughters and one step-daughter; 4 grandchildren      Lives: with wife.      Employment: semi-retired; Company secretary; professor; Social worker, Probation officer.      Tobacco:  no      Alcohol:  about 2 glasses of wine/week      Exercise: active in the yard.    ALLERGIES: Lisinopril  MEDICATIONS:  Current Outpatient Medications  Medication Sig Dispense Refill  . aspirin EC 325 MG EC tablet Take 1 tablet (325 mg total) by mouth daily. 30 tablet 0  . atorvastatin (LIPITOR) 80 MG tablet Take 1 tablet (80 mg total) by mouth daily at 6 PM. 90 tablet 3  . blood glucose meter kit and supplies KIT Dispense based on patient and insurance preference. Use up to four times daily as directed. (FOR ICD-9 250.00, 250.01). 1 each 0  . metFORMIN (GLUCOPHAGE-XR) 500 MG 24 hr tablet Take 1 tablet (500 mg total) by mouth daily with breakfast. 90 tablet 3  . metoprolol succinate (TOPROL-XL) 50 MG 24 hr tablet Take 1 tablet (50 mg total) by mouth daily. 90 tablet 3  . nitroGLYCERIN (NITROSTAT) 0.4 MG SL tablet Place 1 tablet (0.4 mg total) under the tongue every 5 (five) minutes as needed for chest pain. (Patient not taking: Reported on 07/20/2018) 25 tablet 3   No current facility-administered medications for this encounter.     REVIEW OF SYSTEMS:  On review of systems, the patient reports that he is doing well overall. He denies any chest pain, shortness of breath, cough, fevers, chills, night sweats, unintended weight changes. He denies any bowel disturbances, and denies abdominal pain, nausea or vomiting. He denies any new musculoskeletal or joint aches or pains. His IPSS was 4, indicating mild urinary symptoms. He is not sexually active due to severe ED. A complete review of systems is obtained and is  otherwise negative.    PHYSICAL EXAM:  Wt Readings from Last 3 Encounters:  07/20/18 204 lb 9.6 oz (92.8 kg)  05/09/18 197 lb 9.6 oz (89.6 kg)  01/06/18 200 lb (90.7 kg)   Temp Readings from Last 3 Encounters:  07/20/18 98.3 F (36.8 C) (Oral)  05/09/18 97.8 F (36.6 C)  01/06/18 97.6 F (36.4 C) (Oral)   BP Readings from Last 3 Encounters:  07/20/18 137/88  05/09/18 120/62  01/06/18 126/66   Pulse Readings from Last 3 Encounters:  05/09/18 67  01/06/18 61  11/30/17 65   Pain Assessment Pain Score: 0-No pain/10  In general this is a well appearing caucasian gentleman in no acute distress. He is alert and oriented x4 and appropriate throughout the examination. HEENT reveals that the patient is normocephalic, atraumatic. EOMs are intact. PERRLA. Skin is intact without any evidence of gross lesions. Cardiovascular exam reveals a regular rate and rhythm, no clicks rubs or murmurs are auscultated. Chest is clear to auscultation bilaterally. Lymphatic assessment is performed and does not reveal any adenopathy in the cervical, supraclavicular, axillary, or inguinal chains. Abdomen has active bowel sounds in all quadrants and is intact. The abdomen is soft, non tender, non distended. Lower extremities are negative for pretibial pitting edema, deep calf tenderness, cyanosis or clubbing.   KPS = 100  100 - Normal; no complaints; no evidence of disease. 90   - Able to carry on normal activity; minor signs or symptoms of disease. 80   - Normal activity with effort; some signs or symptoms of disease. 29   - Cares for self; unable to carry on normal activity or to do active work. 60   - Requires occasional assistance, but is able to care for most of his personal needs. 50   - Requires considerable assistance and frequent medical care. 75   - Disabled;  requires special care and assistance. 45   - Severely disabled; hospital admission is indicated although death not imminent. 60   - Very  sick; hospital admission necessary; active supportive treatment necessary. 10   - Moribund; fatal processes progressing rapidly. 0     - Dead  Karnofsky DA, Abelmann Crumpler, Craver LS and Burchenal [facility] [phone]) The use of the nitrogen mustards in the palliative treatment of carcinoma: with particular reference to bronchogenic carcinoma Cancer 1 [id number]  LABORATORY DATA:  Lab Results  Component Value Date   WBC 10.9 (H) 05/09/2018   HGB 14.7 05/09/2018   HCT 42.3 05/09/2018   MCV 91 05/09/2018   PLT 174 05/09/2018   Lab Results  Component Value Date   NA 141 05/09/2018   K 4.7 05/09/2018   CL 105 05/09/2018   CO2 22 05/09/2018   Lab Results  Component Value Date   ALT 22 05/09/2018   AST 11 05/09/2018   ALKPHOS 81 05/09/2018   BILITOT 1.7 (H) 05/09/2018     RADIOGRAPHY: No results found.    IMPRESSION/PLAN: 1. 72 y.o. gentleman with biochemically recurrent prostate cancer with detectable, rising postoperative PSA currently at 0.25.  Today, we reviewed the findings and workup thus far.  We discussed the natural history of prostate cancer.  We reviewed the the implications of positive margins, extracapsular extension, and seminal vesicle involvement on the risk of prostate cancer recurrence especially in light of detectable, rising PSA post-prostatectomy.  We discussed salvage radiation treatment directed to the prostatic fossa with regard to the logistics and delivery of external beam radiation treatment. The recommendation is to proceed with a 7.5 week course of salvage radiotherapy in combination with ADT. We discussed and outlined the risks, benefits, short and long-term effects associated with radiotherapy in this setting. We also detailed the role of ADT in the treatment of biochemically recurrent prostate cancer and outlined the associated side effects that could be expected with this therapy.  He was encouraged to ask questions that were answered to his stated satisfaction.  At the  conclusion of our conversation, the patient elects to proceed with salvage radiotherapy to the prostate fossa in combination with androgen deprivation therapy as recommended.  We will share our discussion with Dr. Alinda Money and move forward with coordinating a follow-up visit at [facility] urology to initiate ADT followed by CT simulation/treatment planning in anticipation of beginning treatment in the near future.  He appears to have a good understanding of his disease and our treatment recommendations and is in agreement with the stated plan.  We enjoyed meeting him again today and look forward to participating in his care.   We spent 60 minutes minutes face to face with the patient and more than 50% of that time was spent in counseling and/or coordination of care.    Nicholos Johns, PA-C    Tyler Pita, MD  [facility] Direct Dial: [phone]  Fax: [phone] [email or web address]  Skype  [email or web address]  This document serves as a record of services personally performed by Tyler Pita, MD and Ashlyn Bruning PA-C. It was created on their behalf by Delton Coombes, a trained medical scribe. The creation of this record is based on the scribe's personal observations and the provider's statements to them.

## 2018-07-21 ENCOUNTER — Telehealth: Payer: Self-pay | Admitting: *Deleted

## 2018-07-21 NOTE — Telephone Encounter (Signed)
Spoke to [place] at [place] and was told of patient's appt with Dr. Alinda Money on Monday, Aug. 19th at 2:30 for ADT inj. Reminded patient of this appt and of the CT SIM appt on 8/30 at 9am. Patient was agreeable.

## 2018-08-03 NOTE — Progress Notes (Signed)
  Radiation Oncology         [phone] ________________________________  Name: Lance Stevens MRN: [id number]  Date: 08/04/2018  DOB: 1946-02-03  SIMULATION AND TREATMENT PLANNING NOTE    ICD-10-CM   1. Recurrent prostate cancer ([place]) C61     DIAGNOSIS:  72 y.o. gentleman with biochemically recurrent prostate cancer with detectable, rising postoperative PSA currently at 0.25  NARRATIVE:  The patient was brought to the [facility].  Identity was confirmed.  All relevant records and images related to the planned course of therapy were reviewed.  The patient freely provided informed written consent to proceed with treatment after reviewing the details related to the planned course of therapy. The consent form was witnessed and verified by the simulation staff.  Then, the patient was set-up in a stable reproducible supine position for radiation therapy.  A vacuum lock pillow device was custom fabricated to position his legs in a reproducible immobilized position.  Then, I performed a urethrogram under sterile conditions to identify the prostatic bed.  CT images were obtained.  Surface markings were placed.  The CT images were loaded into the planning software.  Then the prostate bed target, pelvic lymph node target and avoidance structures including the rectum, bladder, bowel and hips were contoured.  Treatment planning then occurred.  The radiation prescription was entered and confirmed.  A total of one complex treatment devices were fabricated. I have requested : Intensity Modulated Radiotherapy (IMRT) is medically necessary for this case for the following reason:  Rectal sparing.Marland Kitchen  PLAN:  The patient will receive 45 Gy in 25 fractions of 1.8 Gy, followed by a boost to the prostate bed to a total dose of 68.4 Gy with 13 additional fractions of 1.8 Gy.   ________________________________  Sheral Apley Tammi Klippel, M.D.

## 2018-08-04 ENCOUNTER — Ambulatory Visit
Admission: RE | Admit: 2018-08-04 | Discharge: 2018-08-04 | Disposition: A | Discharge status: Home / Self Care | Payer: Medicare Other | Source: Ambulatory Visit | Attending: Radiation Oncology | Admitting: Radiation Oncology

## 2018-08-04 DIAGNOSIS — C61 Malignant neoplasm of prostate: Secondary | ICD-10-CM | POA: Diagnosis present

## 2018-08-09 DIAGNOSIS — C61 Malignant neoplasm of prostate: Secondary | ICD-10-CM | POA: Diagnosis not present

## 2018-08-16 ENCOUNTER — Ambulatory Visit
Admission: RE | Admit: 2018-08-16 | Discharge: 2018-08-16 | Disposition: A | Discharge status: Home / Self Care | Payer: Medicare Other | Source: Ambulatory Visit | Attending: Radiation Oncology | Admitting: Radiation Oncology

## 2018-08-16 DIAGNOSIS — C61 Malignant neoplasm of prostate: Secondary | ICD-10-CM | POA: Diagnosis not present

## 2018-08-17 ENCOUNTER — Ambulatory Visit
Admission: RE | Admit: 2018-08-17 | Discharge: 2018-08-17 | Disposition: A | Discharge status: Home / Self Care | Payer: Medicare Other | Source: Ambulatory Visit | Attending: Radiation Oncology | Admitting: Radiation Oncology

## 2018-08-17 DIAGNOSIS — C61 Malignant neoplasm of prostate: Secondary | ICD-10-CM | POA: Diagnosis not present

## 2018-08-18 ENCOUNTER — Ambulatory Visit
Admission: RE | Admit: 2018-08-18 | Discharge: 2018-08-18 | Disposition: A | Discharge status: Home / Self Care | Payer: Medicare Other | Source: Ambulatory Visit | Attending: Radiation Oncology | Admitting: Radiation Oncology

## 2018-08-18 DIAGNOSIS — C61 Malignant neoplasm of prostate: Secondary | ICD-10-CM | POA: Diagnosis not present

## 2018-08-18 NOTE — Progress Notes (Signed)
Pt here for patient teaching.  Pt given Radiation and You booklet.  Reviewed areas of pertinence such as diarrhea, fatigue, sexual and fertility changes and urinary and bladder changes . Pt able to give teach back of use baby wipes, have Imodium on hand and drink plenty of water,. Pt demonstrated understanding, needs reinforcement, no evidence of learning, refused teaching and  of information given and will contact nursing with any questions or concerns.     [email or web address]

## 2018-08-21 ENCOUNTER — Ambulatory Visit
Admission: RE | Admit: 2018-08-21 | Discharge: 2018-08-21 | Disposition: A | Discharge status: Home / Self Care | Payer: Medicare Other | Source: Ambulatory Visit | Attending: Radiation Oncology | Admitting: Radiation Oncology

## 2018-08-21 DIAGNOSIS — C61 Malignant neoplasm of prostate: Secondary | ICD-10-CM | POA: Diagnosis not present

## 2018-08-22 ENCOUNTER — Ambulatory Visit
Admission: RE | Admit: 2018-08-22 | Discharge: 2018-08-22 | Disposition: A | Discharge status: Home / Self Care | Payer: Medicare Other | Source: Ambulatory Visit | Attending: Radiation Oncology | Admitting: Radiation Oncology

## 2018-08-22 DIAGNOSIS — C61 Malignant neoplasm of prostate: Secondary | ICD-10-CM | POA: Diagnosis not present

## 2018-08-23 ENCOUNTER — Ambulatory Visit
Admission: RE | Admit: 2018-08-23 | Discharge: 2018-08-23 | Disposition: A | Discharge status: Home / Self Care | Payer: Medicare Other | Source: Ambulatory Visit | Attending: Radiation Oncology | Admitting: Radiation Oncology

## 2018-08-23 DIAGNOSIS — C61 Malignant neoplasm of prostate: Secondary | ICD-10-CM | POA: Diagnosis not present

## 2018-08-24 ENCOUNTER — Ambulatory Visit
Admission: RE | Admit: 2018-08-24 | Discharge: 2018-08-24 | Disposition: A | Discharge status: Home / Self Care | Payer: Medicare Other | Source: Ambulatory Visit | Attending: Radiation Oncology | Admitting: Radiation Oncology

## 2018-08-24 DIAGNOSIS — C61 Malignant neoplasm of prostate: Secondary | ICD-10-CM | POA: Diagnosis not present

## 2018-08-25 ENCOUNTER — Ambulatory Visit
Admission: RE | Admit: 2018-08-25 | Discharge: 2018-08-25 | Disposition: A | Discharge status: Home / Self Care | Payer: Medicare Other | Source: Ambulatory Visit | Attending: Radiation Oncology | Admitting: Radiation Oncology

## 2018-08-25 DIAGNOSIS — C61 Malignant neoplasm of prostate: Secondary | ICD-10-CM | POA: Diagnosis not present

## 2018-08-28 ENCOUNTER — Ambulatory Visit: Payer: Medicare Other | Admitting: Cardiology

## 2018-08-28 ENCOUNTER — Ambulatory Visit
Admission: RE | Admit: 2018-08-28 | Discharge: 2018-08-28 | Disposition: A | Discharge status: Home / Self Care | Payer: Medicare Other | Source: Ambulatory Visit | Attending: Radiation Oncology | Admitting: Radiation Oncology

## 2018-08-28 ENCOUNTER — Encounter: Payer: Self-pay | Admitting: Medical Oncology

## 2018-08-28 DIAGNOSIS — C61 Malignant neoplasm of prostate: Secondary | ICD-10-CM | POA: Diagnosis not present

## 2018-08-28 NOTE — Progress Notes (Signed)
HPI: Follow-up coronary artery disease.Patient admitted with chest pain August 2017. Cardiac catheterization revealed three-vessel coronary disease. He underwent coronary artery bypass and graft with a LIMA to the LAD, saphenous vein graft to the diagonal, sequential saphenous vein graft to the intermediate and circumflex and sequential saphenous vein graft to the distal right coronary artery and posterior lateral. Patient did have postoperative atrial fibrillation which was treated with amiodarone. Echocardiogram August 2017 showed normal LV function with mild aortic insufficiency. Also note preoperative carotid Dopplers showed 1-39% bilateral stenosis.  Previous CTA January 2018 showed left renal artery stenosis.  CTA January 2019 shows thoracic aortic aneurysm of 4 cm.  Since last seen,the patient denies any dyspnea on exertion, orthopnea, PND, pedal edema, palpitations, syncope or chest pain.   Current Outpatient Medications  Medication Sig Dispense Refill  . aspirin EC 325 MG EC tablet Take 1 tablet (325 mg total) by mouth daily. 30 tablet 0  . atorvastatin (LIPITOR) 80 MG tablet Take 1 tablet (80 mg total) by mouth daily at 6 PM. 90 tablet 3  . blood glucose meter kit and supplies KIT Dispense based on patient and insurance preference. Use up to four times daily as directed. (FOR ICD-9 250.00, 250.01). 1 each 0  . ibuprofen (ADVIL,MOTRIN) 200 MG tablet Take 200 mg by mouth every 6 (six) hours as needed.    . loperamide (IMODIUM) 2 MG capsule Take by mouth as needed for diarrhea or loose stools.    . metFORMIN (GLUCOPHAGE-XR) 500 MG 24 hr tablet Take 1 tablet (500 mg total) by mouth daily with breakfast. 90 tablet 3  . metoprolol succinate (TOPROL-XL) 50 MG 24 hr tablet Take 1 tablet (50 mg total) by mouth daily. 90 tablet 3  . nitroGLYCERIN (NITROSTAT) 0.4 MG SL tablet Place 1 tablet (0.4 mg total) under the tongue every 5 (five) minutes as needed for chest pain. 25 tablet 3   No  current facility-administered medications for this visit.      Past Medical History:  Diagnosis Date  . Acute maxillary sinusitis 01/06/2018  . Allergy   . Anginal pain ([place])   . Borderline diabetes   . Bronchitis    recurrent  . CAD in native artery    a. Multivessel CAD by cath 07/2016 - tx with medical therapy with consideration of CABG for refractory angina.  . Essential hypertension   . Fracture, finger    LEFT HAND WITH SWELLING AND BRUISING  . Heart murmur    ? teen  . History of gout   . History of kidney stones   . History of kidney stones   . History of skin cancer    possible - pt not sure of dx  . Hyperlipidemia   . Pneumonia    hx  . Prostate cancer [facility])    a. s/p radical prostatectomy 04/2016.  Marland Kitchen Sinus infection    recurrent    Past Surgical History:  Procedure Laterality Date  . CARDIAC CATHETERIZATION N/A 07/12/2016   Procedure: Left Heart Cath and Coronary Angiography;  Surgeon: Peter M Martinique, MD;  Location: [facility];  Service: Cardiovascular;  Laterality: N/A;  . CARDIAC SURGERY    . CORONARY ARTERY BYPASS GRAFT N/A 07/23/2016   Procedure: CORONARY ARTERY BYPASS GRAFTING (CABG), ON PUMP, TIMES SIX, USING LEFT INTERNAL MAMMARY ARTERY, RIGHT GREATER SAPHENOUS VEIN HARVESTED ENDOSCOPICALLY;  Surgeon: Grace Isaac, MD;  Location: [facility];  Service: Open Heart Surgery;  Laterality: N/A;  LIMA to LAD, SVG to DIAG, SEQ SVG to INTERMEDIATE and CIRC, SVG to DISTAL RIGHT, SVG to MID POSTERIOR LATERAL.  Marland Kitchen EYE SURGERY     B cataracts removed.  Marland Kitchen EYE SURGERY     lens implants  . LITHOTRIPSY     89  . LYMPHADENECTOMY Bilateral 04/05/2016   Procedure: LYMPHADENECTOMY, BILATERAL PELVIC LYMPHADENECTOMY;  Surgeon: Raynelle Bring, MD;  Location: [facility];  Service: Urology;  Laterality: Bilateral;  . PROSTATE BIOPSY    . ROBOT ASSISTED LAPAROSCOPIC RADICAL PROSTATECTOMY N/A 04/05/2016   Procedure: XI ROBOTIC ASSISTED LAPAROSCOPIC RADICAL PROSTATECTOMY LEVEL 2;   Surgeon: Raynelle Bring, MD;  Location: [facility];  Service: Urology;  Laterality: N/A;  . TEE WITHOUT CARDIOVERSION N/A 07/23/2016   Procedure: TRANSESOPHAGEAL ECHOCARDIOGRAM (TEE);  Surgeon: Grace Isaac, MD;  Location: [facility];  Service: Open Heart Surgery;  Laterality: N/A;  . TONSILLECTOMY AND ADENOIDECTOMY     also received radiation treatments, as was frequent in children at the time  . WISDOM TOOTH EXTRACTION      Social History   Socioeconomic History  . Marital status: Married    Spouse name: [name]  . Number of children: 3  . Years of education: Not on file  . Highest education level: Professional school degree (e.g., MD, DDS, DVM, JD)  Occupational History  . 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 Financial resource strain: Not hard at all  . Food insecurity:    Worry: Never true    Inability: Never true  . Transportation needs:    Medical: No    Non-medical: No  Tobacco Use  . Smoking status: Never Smoker  . Smokeless tobacco: Never Used  Substance and Sexual Activity  . Alcohol use: Yes    Alcohol/week: 2.0 standard drinks    Types: 2 Standard drinks or equivalent per week    Comment: wine - red 2 glasses wine per week  . Drug use: No  . Sexual activity: Yes    Partners: Female  Lifestyle  . Physical activity:    Days per week: 7 days    Minutes per session: 60 min  . Stress: Only a little  Relationships  . Social connections:    Talks on phone: Once a week    Gets together: Once a week    Attends religious service: Never    Active member of club or organization: No    Attends meetings of clubs or organizations: Never    Relationship status: Married  . Intimate partner violence:    Fear of current or ex partner: No    Emotionally abused: No    Physically abused: No    Forced sexual activity: No  Other Topics Concern  . Not on file  Social History Narrative   Marital  status: married x 25 years; second marriage; first marriage x 12 years.      Children: 3 daughters and one step-daughter; 4 grandchildren      Lives: with wife.      Employment: semi-retired; Company secretary; professor; Social worker, Probation officer.      Tobacco:  no      Alcohol: about 2 glasses of wine/week      Exercise: active in the yard.    Family History  Problem Relation Age of Onset  . Heart attack Father   . Heart disease Father 68       AMI as cause of death  . Diabetes  Father   . Hyperlipidemia Father   . Parkinson's disease Brother   . Diabetes Brother   . Cancer Brother        melanoma retina  . Heart disease Brother   . Stroke Maternal Grandmother   . Cancer Mother 59       Hodgkins   . Glaucoma Mother   . Colon cancer Neg Hx   . 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 Remaining systems are negative.  Physical Exam: Well-developed well-nourished in no acute distress.  Skin is warm and dry.  HEENT is normal.  Neck is supple.  Chest is clear to auscultation with normal expansion.  Cardiovascular exam is regular rate and rhythm.  Abdominal exam nontender or distended. No masses palpated. Extremities show no edema. neuro grossly intact  ECG-sinus rhythm with occasional PACs, right bundle branch block.  Personally reviewed  A/P  1 coronary artery disease status post coronary artery bypass graft-continue medical therapy including aspirin (change to 81 mg daily) and statin.  2 thoracic aortic aneurysm-plan follow-up CTA January 2020.  3 renal artery stenosis-we will arrange renal Dopplers for follow-up.  4 hypertension-blood pressure is controlled.  You present medications.  5 hyperlipidemia-continue statin.    Kirk Ruths, MD

## 2018-08-29 ENCOUNTER — Ambulatory Visit
Admission: RE | Admit: 2018-08-29 | Discharge: 2018-08-29 | Disposition: A | Discharge status: Home / Self Care | Payer: Medicare Other | Source: Ambulatory Visit | Attending: Radiation Oncology | Admitting: Radiation Oncology

## 2018-08-29 DIAGNOSIS — C61 Malignant neoplasm of prostate: Secondary | ICD-10-CM | POA: Diagnosis not present

## 2018-08-30 ENCOUNTER — Ambulatory Visit
Admission: RE | Admit: 2018-08-30 | Discharge: 2018-08-30 | Disposition: A | Discharge status: Home / Self Care | Payer: Medicare Other | Source: Ambulatory Visit | Attending: Radiation Oncology | Admitting: Radiation Oncology

## 2018-08-30 DIAGNOSIS — C61 Malignant neoplasm of prostate: Secondary | ICD-10-CM | POA: Diagnosis not present

## 2018-08-31 ENCOUNTER — Ambulatory Visit
Admission: RE | Admit: 2018-08-31 | Discharge: 2018-08-31 | Disposition: A | Discharge status: Home / Self Care | Payer: Medicare Other | Source: Ambulatory Visit | Attending: Radiation Oncology | Admitting: Radiation Oncology

## 2018-08-31 DIAGNOSIS — C61 Malignant neoplasm of prostate: Secondary | ICD-10-CM | POA: Diagnosis not present

## 2018-09-01 ENCOUNTER — Ambulatory Visit
Admission: RE | Admit: 2018-09-01 | Discharge: 2018-09-01 | Disposition: A | Discharge status: Home / Self Care | Payer: Medicare Other | Source: Ambulatory Visit | Attending: Radiation Oncology | Admitting: Radiation Oncology

## 2018-09-01 DIAGNOSIS — C61 Malignant neoplasm of prostate: Secondary | ICD-10-CM | POA: Diagnosis not present

## 2018-09-04 ENCOUNTER — Encounter: Payer: Self-pay | Admitting: Cardiology

## 2018-09-04 ENCOUNTER — Encounter

## 2018-09-04 ENCOUNTER — Ambulatory Visit
Admission: RE | Admit: 2018-09-04 | Discharge: 2018-09-04 | Disposition: A | Discharge status: Home / Self Care | Payer: Medicare Other | Source: Ambulatory Visit | Attending: Radiation Oncology | Admitting: Radiation Oncology

## 2018-09-04 ENCOUNTER — Ambulatory Visit: Payer: Medicare Other | Admitting: Cardiology

## 2018-09-04 VITALS — BP 120/60 | HR 58 | Ht 70.0 in | Wt 197.2 lb

## 2018-09-04 DIAGNOSIS — E7800 Pure hypercholesterolemia, unspecified: Secondary | ICD-10-CM | POA: Diagnosis not present

## 2018-09-04 DIAGNOSIS — I701 Atherosclerosis of renal artery: Secondary | ICD-10-CM

## 2018-09-04 DIAGNOSIS — C61 Malignant neoplasm of prostate: Secondary | ICD-10-CM | POA: Diagnosis not present

## 2018-09-04 DIAGNOSIS — I2510 Atherosclerotic heart disease of native coronary artery without angina pectoris: Secondary | ICD-10-CM | POA: Diagnosis not present

## 2018-09-04 DIAGNOSIS — I10 Essential (primary) hypertension: Secondary | ICD-10-CM

## 2018-09-04 MED ORDER — ASPIRIN EC 81 MG PO TBEC: 81.0000 mg | DELAYED_RELEASE_TABLET | Freq: Every day | ORAL | Status: AC

## 2018-09-04 NOTE — Patient Instructions (Signed)
Medication Instructions:   DECREASE ASPIRIN TO 81 MG ONCE DAILY  Testing/Procedures:  Your physician has requested that you have a renal artery duplex. During this test, an ultrasound is used to evaluate blood flow to the kidneys. Allow one hour for this exam. Do not eat after midnight the day before and avoid carbonated beverages. Take your medications as you usually do.    Follow-Up:  Your physician wants you to follow-up in: [place] will receive a reminder letter in the mail two months in advance. If you don't receive a letter, please call our office to schedule the follow-up appointment.   If you need a refill on your cardiac medications before your next appointment, please call your pharmacy.

## 2018-09-05 ENCOUNTER — Ambulatory Visit
Admission: RE | Admit: 2018-09-05 | Discharge: 2018-09-05 | Disposition: A | Discharge status: Home / Self Care | Payer: Medicare Other | Source: Ambulatory Visit | Attending: Radiation Oncology | Admitting: Radiation Oncology

## 2018-09-05 DIAGNOSIS — C61 Malignant neoplasm of prostate: Secondary | ICD-10-CM | POA: Insufficient documentation

## 2018-09-06 ENCOUNTER — Ambulatory Visit
Admission: RE | Admit: 2018-09-06 | Discharge: 2018-09-06 | Disposition: A | Discharge status: Home / Self Care | Payer: Medicare Other | Source: Ambulatory Visit | Attending: Radiation Oncology | Admitting: Radiation Oncology

## 2018-09-06 DIAGNOSIS — C61 Malignant neoplasm of prostate: Secondary | ICD-10-CM | POA: Diagnosis not present

## 2018-09-07 ENCOUNTER — Ambulatory Visit
Admission: RE | Admit: 2018-09-07 | Discharge: 2018-09-07 | Disposition: A | Discharge status: Home / Self Care | Payer: Medicare Other | Source: Ambulatory Visit | Attending: Radiation Oncology | Admitting: Radiation Oncology

## 2018-09-07 DIAGNOSIS — C61 Malignant neoplasm of prostate: Secondary | ICD-10-CM | POA: Diagnosis not present

## 2018-09-08 ENCOUNTER — Ambulatory Visit
Admission: RE | Admit: 2018-09-08 | Discharge: 2018-09-08 | Disposition: A | Discharge status: Home / Self Care | Payer: Medicare Other | Source: Ambulatory Visit | Attending: Radiation Oncology | Admitting: Radiation Oncology

## 2018-09-08 DIAGNOSIS — C61 Malignant neoplasm of prostate: Secondary | ICD-10-CM | POA: Diagnosis not present

## 2018-09-11 ENCOUNTER — Ambulatory Visit
Admission: RE | Admit: 2018-09-11 | Discharge: 2018-09-11 | Disposition: A | Discharge status: Home / Self Care | Payer: Medicare Other | Source: Ambulatory Visit | Attending: Radiation Oncology | Admitting: Radiation Oncology

## 2018-09-11 DIAGNOSIS — C61 Malignant neoplasm of prostate: Secondary | ICD-10-CM | POA: Diagnosis not present

## 2018-09-12 ENCOUNTER — Ambulatory Visit
Admission: RE | Admit: 2018-09-12 | Discharge: 2018-09-12 | Disposition: A | Discharge status: Home / Self Care | Payer: Medicare Other | Source: Ambulatory Visit | Attending: Radiation Oncology | Admitting: Radiation Oncology

## 2018-09-12 DIAGNOSIS — C61 Malignant neoplasm of prostate: Secondary | ICD-10-CM | POA: Diagnosis not present

## 2018-09-13 ENCOUNTER — Ambulatory Visit
Admission: RE | Admit: 2018-09-13 | Discharge: 2018-09-13 | Disposition: A | Discharge status: Home / Self Care | Payer: Medicare Other | Source: Ambulatory Visit | Attending: Radiation Oncology | Admitting: Radiation Oncology

## 2018-09-13 DIAGNOSIS — C61 Malignant neoplasm of prostate: Secondary | ICD-10-CM | POA: Diagnosis not present

## 2018-09-14 ENCOUNTER — Ambulatory Visit
Admission: RE | Admit: 2018-09-14 | Discharge: 2018-09-14 | Disposition: A | Discharge status: Home / Self Care | Payer: Medicare Other | Source: Ambulatory Visit | Attending: Radiation Oncology | Admitting: Radiation Oncology

## 2018-09-14 DIAGNOSIS — C61 Malignant neoplasm of prostate: Secondary | ICD-10-CM | POA: Diagnosis not present

## 2018-09-15 ENCOUNTER — Ambulatory Visit
Admission: RE | Admit: 2018-09-15 | Discharge: 2018-09-15 | Disposition: A | Discharge status: Home / Self Care | Payer: Medicare Other | Source: Ambulatory Visit | Attending: Radiation Oncology | Admitting: Radiation Oncology

## 2018-09-15 DIAGNOSIS — C61 Malignant neoplasm of prostate: Secondary | ICD-10-CM | POA: Diagnosis not present

## 2018-09-18 ENCOUNTER — Ambulatory Visit
Admission: RE | Admit: 2018-09-18 | Discharge: 2018-09-18 | Disposition: A | Discharge status: Home / Self Care | Payer: Medicare Other | Source: Ambulatory Visit | Attending: Radiation Oncology | Admitting: Radiation Oncology

## 2018-09-18 DIAGNOSIS — C61 Malignant neoplasm of prostate: Secondary | ICD-10-CM | POA: Diagnosis not present

## 2018-09-19 ENCOUNTER — Ambulatory Visit
Admission: RE | Admit: 2018-09-19 | Discharge: 2018-09-19 | Disposition: A | Discharge status: Home / Self Care | Payer: Medicare Other | Source: Ambulatory Visit | Attending: Radiation Oncology | Admitting: Radiation Oncology

## 2018-09-19 DIAGNOSIS — C61 Malignant neoplasm of prostate: Secondary | ICD-10-CM | POA: Diagnosis not present

## 2018-09-20 ENCOUNTER — Ambulatory Visit
Admission: RE | Admit: 2018-09-20 | Discharge: 2018-09-20 | Disposition: A | Discharge status: Home / Self Care | Payer: Medicare Other | Source: Ambulatory Visit | Attending: Radiation Oncology | Admitting: Radiation Oncology

## 2018-09-20 DIAGNOSIS — C61 Malignant neoplasm of prostate: Secondary | ICD-10-CM | POA: Diagnosis not present

## 2018-09-21 ENCOUNTER — Encounter: Payer: Self-pay | Admitting: Medical Oncology

## 2018-09-21 ENCOUNTER — Ambulatory Visit
Admission: RE | Admit: 2018-09-21 | Discharge: 2018-09-21 | Disposition: A | Discharge status: Home / Self Care | Payer: Medicare Other | Source: Ambulatory Visit | Attending: Radiation Oncology | Admitting: Radiation Oncology

## 2018-09-21 DIAGNOSIS — C61 Malignant neoplasm of prostate: Secondary | ICD-10-CM | POA: Diagnosis not present

## 2018-09-22 ENCOUNTER — Ambulatory Visit
Admission: RE | Admit: 2018-09-22 | Discharge: 2018-09-22 | Disposition: A | Discharge status: Home / Self Care | Payer: Medicare Other | Source: Ambulatory Visit | Attending: Radiation Oncology | Admitting: Radiation Oncology

## 2018-09-22 DIAGNOSIS — C61 Malignant neoplasm of prostate: Secondary | ICD-10-CM | POA: Diagnosis not present

## 2018-09-25 ENCOUNTER — Ambulatory Visit
Admission: RE | Admit: 2018-09-25 | Discharge: 2018-09-25 | Disposition: A | Discharge status: Home / Self Care | Payer: Medicare Other | Source: Ambulatory Visit | Attending: Radiation Oncology | Admitting: Radiation Oncology

## 2018-09-25 DIAGNOSIS — C61 Malignant neoplasm of prostate: Secondary | ICD-10-CM | POA: Diagnosis not present

## 2018-09-26 ENCOUNTER — Ambulatory Visit
Admission: RE | Admit: 2018-09-26 | Discharge: 2018-09-26 | Disposition: A | Discharge status: Home / Self Care | Payer: Medicare Other | Source: Ambulatory Visit | Attending: Radiation Oncology | Admitting: Radiation Oncology

## 2018-09-26 DIAGNOSIS — C61 Malignant neoplasm of prostate: Secondary | ICD-10-CM | POA: Diagnosis not present

## 2018-09-27 ENCOUNTER — Ambulatory Visit
Admission: RE | Admit: 2018-09-27 | Discharge: 2018-09-27 | Disposition: A | Discharge status: Home / Self Care | Payer: Medicare Other | Source: Ambulatory Visit | Attending: Radiation Oncology | Admitting: Radiation Oncology

## 2018-09-27 DIAGNOSIS — C61 Malignant neoplasm of prostate: Secondary | ICD-10-CM | POA: Diagnosis not present

## 2018-09-28 ENCOUNTER — Ambulatory Visit
Admission: RE | Admit: 2018-09-28 | Discharge: 2018-09-28 | Disposition: A | Discharge status: Home / Self Care | Payer: Medicare Other | Source: Ambulatory Visit | Attending: Radiation Oncology | Admitting: Radiation Oncology

## 2018-09-28 DIAGNOSIS — C61 Malignant neoplasm of prostate: Secondary | ICD-10-CM | POA: Diagnosis not present

## 2018-09-29 ENCOUNTER — Ambulatory Visit
Admission: RE | Admit: 2018-09-29 | Discharge: 2018-09-29 | Disposition: A | Discharge status: Home / Self Care | Payer: Medicare Other | Source: Ambulatory Visit | Attending: Radiation Oncology | Admitting: Radiation Oncology

## 2018-09-29 DIAGNOSIS — C61 Malignant neoplasm of prostate: Secondary | ICD-10-CM | POA: Diagnosis not present

## 2018-10-02 ENCOUNTER — Ambulatory Visit
Admission: RE | Admit: 2018-10-02 | Discharge: 2018-10-02 | Disposition: A | Discharge status: Home / Self Care | Payer: Medicare Other | Source: Ambulatory Visit | Attending: Radiation Oncology | Admitting: Radiation Oncology

## 2018-10-02 DIAGNOSIS — C61 Malignant neoplasm of prostate: Secondary | ICD-10-CM | POA: Diagnosis not present

## 2018-10-03 ENCOUNTER — Ambulatory Visit
Admission: RE | Admit: 2018-10-03 | Discharge: 2018-10-03 | Disposition: A | Discharge status: Home / Self Care | Payer: Medicare Other | Source: Ambulatory Visit | Attending: Radiation Oncology | Admitting: Radiation Oncology

## 2018-10-03 DIAGNOSIS — C61 Malignant neoplasm of prostate: Secondary | ICD-10-CM | POA: Diagnosis not present

## 2018-10-04 ENCOUNTER — Ambulatory Visit
Admission: RE | Admit: 2018-10-04 | Discharge: 2018-10-04 | Disposition: A | Discharge status: Home / Self Care | Payer: Medicare Other | Source: Ambulatory Visit | Attending: Radiation Oncology | Admitting: Radiation Oncology

## 2018-10-04 DIAGNOSIS — C61 Malignant neoplasm of prostate: Secondary | ICD-10-CM | POA: Diagnosis not present

## 2018-10-05 ENCOUNTER — Ambulatory Visit
Admission: RE | Admit: 2018-10-05 | Discharge: 2018-10-05 | Disposition: A | Discharge status: Home / Self Care | Payer: Medicare Other | Source: Ambulatory Visit | Attending: Radiation Oncology | Admitting: Radiation Oncology

## 2018-10-05 DIAGNOSIS — C61 Malignant neoplasm of prostate: Secondary | ICD-10-CM | POA: Diagnosis not present

## 2018-10-06 ENCOUNTER — Encounter: Payer: Self-pay | Admitting: Medical Oncology

## 2018-10-06 ENCOUNTER — Ambulatory Visit
Admission: RE | Admit: 2018-10-06 | Discharge: 2018-10-06 | Disposition: A | Discharge status: Home / Self Care | Payer: Medicare Other | Source: Ambulatory Visit | Attending: Radiation Oncology | Admitting: Radiation Oncology

## 2018-10-06 ENCOUNTER — Encounter: Payer: Self-pay | Admitting: Radiation Oncology

## 2018-10-06 DIAGNOSIS — C61 Malignant neoplasm of prostate: Secondary | ICD-10-CM | POA: Diagnosis present

## 2018-10-09 ENCOUNTER — Inpatient Hospital Stay (HOSPITAL_COMMUNITY): Admission: RE | Admit: 2018-10-09 | Payer: Medicare Other | Source: Ambulatory Visit

## 2018-10-12 NOTE — Progress Notes (Signed)
  Radiation Oncology         [phone] ________________________________  Name: Lance Stevens MRN: [id number]  Date: 10/06/2018  DOB: 1945/12/27  End of Treatment Note  Diagnosis:   72 y.o. male with biochemically recurrent prostate cancer with detectable, rising postoperative PSA currently at 0.25     Indication for treatment:  Curative, Salvage Prostatic Fossa Radiotherapy       Radiation treatment dates:   08/16/2018 - 10/06/2018  Site/dose:   The patient received 45 Gy in 25 fractions of 1.8 Gy, followed by a boost to the prostatic fossa to a total dose of 68.4 Gy with 13 additional fractions of 1.8 Gy.  Beams/energy:   The prostatic fossa was treated using helical intensity modulated radiotherapy delivering 6 megavolt photons. Image guidance was performed with megavoltage CT studies prior to each fraction. He was immobilized with a body fix lower extremity mold.  Narrative: The patient tolerated radiation treatment relatively well.  He experienced mild fatigue and some minor urinary irritation with urgency, leakage, and nocturia x3-4. He also had some diarrhea that was manageable with the use of Imodium.  Plan: The patient has completed radiation treatment. He will return to radiation oncology clinic for routine followup in one month. I advised him to call or return sooner if he has any questions or concerns related to his recovery or treatment. ________________________________  Sheral Apley. Tammi Klippel, M.D.  This document serves as a record of services personally performed by Tyler Pita, MD. It was created on his behalf by Rae Lips, a trained medical scribe. The creation of this record is based on the scribe's personal observations and the provider's statements to them. This document has been checked and approved by the attending provider.

## 2018-11-07 ENCOUNTER — Encounter: Payer: Self-pay | Admitting: Urology

## 2018-11-07 ENCOUNTER — Other Ambulatory Visit: Payer: Self-pay

## 2018-11-07 ENCOUNTER — Ambulatory Visit
Admission: RE | Admit: 2018-11-07 | Discharge: 2018-11-07 | Disposition: A | Discharge status: Home / Self Care | Payer: Medicare Other | Source: Ambulatory Visit | Attending: Urology | Admitting: Urology

## 2018-11-07 VITALS — BP 130/69 | HR 69 | Temp 97.5°F | Resp 20 | Wt 196.8 lb

## 2018-11-07 DIAGNOSIS — C61 Malignant neoplasm of prostate: Secondary | ICD-10-CM | POA: Insufficient documentation

## 2018-11-07 DIAGNOSIS — Z79899 Other long term (current) drug therapy: Secondary | ICD-10-CM | POA: Insufficient documentation

## 2018-11-07 NOTE — Progress Notes (Signed)
Radiation Oncology         [phone] ________________________________  Name: KERRION KEMPPAINEN MRN: [id number]  Date: 11/07/2018  DOB: 04/13/1946  Post Treatment Note  CC: Harrison Mons, PA  Raynelle Bring, MD  Diagnosis:   72 y.o. male with biochemically recurrent prostate cancer with detectable, rising postoperative PSA currently at 0.25     Interval Since Last Radiation:  4 weeks  08/16/2018 - 10/06/2018:  The patient received 45 Gy in 25 fractions of 1.8 Gy, followed by a boost to the prostatic fossa to a total dose of 68.4 Gy with 13 additional fractions of 1.8 Gy.  Narrative:  The patient returns today for routine follow-up. He tolerated radiation treatment relatively well.  He experienced mild fatigue and some minor urinary irritation with urgency, leakage, and nocturia x3-4. He also had some diarrhea that was manageable with the use of Imodium.                              On review of systems, the patient states that he is doing very well overall.  He has noticed a gradual improvement in his lower urinary and bowel symptoms and reports that he is pretty much back to his baseline.  He specifically denies dysuria, gross hematuria, excessive daytime frequency, urgency, weak stream, incomplete emptying or incontinence.  His current IPSS score is 4 indicating mild symptoms only.  He reports a healthy appetite and is maintaining his weight.  He denies abdominal pain, nausea, vomiting, diarrhea or constipation.  He has continued to tolerate the ADT well, 61-monthLupron injection given in October 2019.  Overall, he is quite pleased with his progress to date.  He has a scheduled follow-up visit with Dr. BAlinda Moneyscheduled for 01/09/2019.  ALLERGIES:  is allergic to lisinopril.  Meds: Current Outpatient Medications  Medication Sig Dispense Refill  . aspirin 81 MG tablet Take 1 tablet (81 mg total) by mouth daily.    .Marland Kitchenatorvastatin (LIPITOR) 80 MG tablet Take 1 tablet (80 mg total) by mouth  daily at 6 PM. 90 tablet 3  . blood glucose meter kit and supplies KIT Dispense based on patient and insurance preference. Use up to four times daily as directed. (FOR ICD-9 250.00, 250.01). 1 each 0  . ibuprofen (ADVIL,MOTRIN) 200 MG tablet Take 200 mg by mouth every 6 (six) hours as needed.    . metFORMIN (GLUCOPHAGE-XR) 500 MG 24 hr tablet Take 1 tablet (500 mg total) by mouth daily with breakfast. 90 tablet 3  . metoprolol succinate (TOPROL-XL) 50 MG 24 hr tablet Take 1 tablet (50 mg total) by mouth daily. 90 tablet 3  . loperamide (IMODIUM) 2 MG capsule Take by mouth as needed for diarrhea or loose stools.    . nitroGLYCERIN (NITROSTAT) 0.4 MG SL tablet Place 1 tablet (0.4 mg total) under the tongue every 5 (five) minutes as needed for chest pain. (Patient not taking: Reported on 11/07/2018) 25 tablet 3   No current facility-administered medications for this encounter.     Physical Findings:  weight is 196 lb 12.8 oz (89.3 kg). His oral temperature is 97.5 F (36.4 C) (abnormal). His blood pressure is 130/69 and his pulse is 69. His respiration is 20 and oxygen saturation is 100%.  Pain Assessment Pain Score: 0-No pain/10 In general this is a well appearing Caucasian male in no acute distress.  He's alert and oriented x4 and appropriate throughout the examination.  Cardiopulmonary assessment is negative for acute distress and he exhibits normal effort.   Lab Findings: Lab Results  Component Value Date   WBC 10.9 (H) 05/09/2018   HGB 14.7 05/09/2018   HCT 42.3 05/09/2018   MCV 91 05/09/2018   PLT 174 05/09/2018     Radiographic Findings: No results found.  Impression/Plan: 21. 72 y.o. male with biochemically recurrent prostate cancer with detectable, rising postoperative PSA currently at 0.25.   He will continue to follow up with urology for ongoing PSA determinations and has an appointment scheduled with Dr. Alinda Money on 01/09/2019. He understands what to expect with regards to PSA  monitoring going forward. I will look forward to following his response to treatment via correspondence with urology, and would be happy to continue to participate in his care if clinically indicated. I talked to the patient about what to expect in the future, including his risk for erectile dysfunction and rectal bleeding. I encouraged him to call or return to the office if he has any questions regarding his previous radiation or possible radiation side effects. He was comfortable with this plan and will follow up as needed.    Nicholos Johns, PA-C

## 2018-12-13 ENCOUNTER — Ambulatory Visit (HOSPITAL_COMMUNITY)
Admission: RE | Admit: 2018-12-13 | Discharge: 2018-12-13 | Disposition: A | Discharge status: Home / Self Care | Payer: Medicare Other | Source: Ambulatory Visit | Attending: Cardiology | Admitting: Cardiology

## 2018-12-13 DIAGNOSIS — I701 Atherosclerosis of renal artery: Secondary | ICD-10-CM | POA: Insufficient documentation

## 2019-01-02 LAB — HM DIABETES EYE EXAM

## 2019-01-06 NOTE — Progress Notes (Signed)
Repeat in one yr

## 2019-03-22 ENCOUNTER — Telehealth: Payer: Self-pay | Admitting: Medical Oncology

## 2019-03-22 NOTE — Telephone Encounter (Signed)
Lance Stevens called stating he is in the process of moving to [place]. He is not sure how to get his medical records from Dr. Gwen Pounds and his radiation treatments. I asked him to contact Jeannette records at [facility] and she will be happy to get his records. He states he is doing well and hopes he will not need any further treatment in the future. I encouraged him to establish medical care after his move to continue having his PSA monitored. He voiced understanding and thanked me for all the care he received at [facility]. I assured him that I am available after his move to help him establish care if needed.

## 2019-09-07 ENCOUNTER — Telehealth: Payer: Self-pay | Admitting: Radiation Oncology

## 2019-09-07 NOTE — Telephone Encounter (Signed)
Received call from patient that he has moved to [place], [place]. Patient is requesting his medical records reference the therapy he received one year ago under the care of Dr. Tammi Klippel. Forwarded patient's request on the [employer]. Patient understands that Juliann Pulse will be in touch.

## 2019-09-10 ENCOUNTER — Telehealth: Payer: Self-pay | Admitting: *Deleted

## 2019-09-10 NOTE — Telephone Encounter (Signed)
On 09-11-19 mail medical records to patient

## 2019-12-27 ENCOUNTER — Encounter: Payer: Self-pay | Admitting: *Deleted

## 2020-01-08 ENCOUNTER — Encounter: Payer: Self-pay | Admitting: *Deleted
